# Patient Record
Sex: Male | Born: 1945 | Race: White | Hispanic: No | Marital: Married | Smoking: Former smoker
Health system: Southern US, Community
[De-identification: ages and names within clinical notes are randomized; demographics above are authoritative.]

## PROBLEM LIST (undated history)

## (undated) DIAGNOSIS — Z808 Family history of malignant neoplasm of other organs or systems: Secondary | ICD-10-CM

## (undated) DIAGNOSIS — Z860100 Personal history of colon polyps, unspecified: Secondary | ICD-10-CM

## (undated) DIAGNOSIS — F329 Major depressive disorder, single episode, unspecified: Secondary | ICD-10-CM

## (undated) DIAGNOSIS — E785 Hyperlipidemia, unspecified: Secondary | ICD-10-CM

## (undated) DIAGNOSIS — N200 Calculus of kidney: Secondary | ICD-10-CM

## (undated) DIAGNOSIS — J984 Other disorders of lung: Secondary | ICD-10-CM

## (undated) DIAGNOSIS — F32A Depression, unspecified: Secondary | ICD-10-CM

## (undated) DIAGNOSIS — I10 Essential (primary) hypertension: Secondary | ICD-10-CM

## (undated) DIAGNOSIS — Z800 Family history of malignant neoplasm of digestive organs: Secondary | ICD-10-CM

## (undated) DIAGNOSIS — R5383 Other fatigue: Secondary | ICD-10-CM

## (undated) HISTORY — DX: Personal history of colonic polyps: Z86.010

## (undated) HISTORY — DX: Personal history of colon polyps, unspecified: Z86.0100

## (undated) HISTORY — DX: Essential (primary) hypertension: I10

## (undated) HISTORY — DX: Other fatigue: R53.83

## (undated) HISTORY — DX: Hyperlipidemia, unspecified: E78.5

## (undated) HISTORY — DX: Family history of malignant neoplasm of other organs or systems: Z80.8

## (undated) HISTORY — PX: KNEE ARTHROSCOPY: SUR90

## (undated) HISTORY — PX: SKIN CANCER EXCISION: SHX779

## (undated) HISTORY — DX: Family history of malignant neoplasm of digestive organs: Z80.0

## (undated) HISTORY — DX: Depression, unspecified: F32.A

## (undated) HISTORY — DX: Other disorders of lung: J98.4

## (undated) HISTORY — DX: Calculus of kidney: N20.0

## (undated) HISTORY — DX: Major depressive disorder, single episode, unspecified: F32.9

---

## 2009-03-13 ENCOUNTER — Encounter: Payer: Self-pay | Admitting: Gastroenterology

## 2009-03-18 ENCOUNTER — Encounter (INDEPENDENT_AMBULATORY_CARE_PROVIDER_SITE_OTHER): Payer: Self-pay | Admitting: *Deleted

## 2009-03-19 ENCOUNTER — Ambulatory Visit: Payer: Self-pay | Admitting: Gastroenterology

## 2009-04-07 ENCOUNTER — Ambulatory Visit: Payer: Self-pay | Admitting: Gastroenterology

## 2009-04-10 ENCOUNTER — Encounter: Payer: Self-pay | Admitting: Gastroenterology

## 2010-03-25 ENCOUNTER — Ambulatory Visit (HOSPITAL_COMMUNITY): Admit: 2010-03-25 | Payer: Self-pay | Admitting: Psychiatry

## 2010-04-21 ENCOUNTER — Ambulatory Visit
Admission: RE | Admit: 2010-04-21 | Discharge: 2010-04-21 | Payer: Self-pay | Source: Home / Self Care | Attending: Internal Medicine | Admitting: Internal Medicine

## 2010-04-21 DIAGNOSIS — N200 Calculus of kidney: Secondary | ICD-10-CM | POA: Insufficient documentation

## 2010-04-21 DIAGNOSIS — R5383 Other fatigue: Secondary | ICD-10-CM | POA: Insufficient documentation

## 2010-04-21 DIAGNOSIS — F329 Major depressive disorder, single episode, unspecified: Secondary | ICD-10-CM | POA: Insufficient documentation

## 2010-04-21 DIAGNOSIS — J309 Allergic rhinitis, unspecified: Secondary | ICD-10-CM | POA: Insufficient documentation

## 2010-04-21 DIAGNOSIS — R5381 Other malaise: Secondary | ICD-10-CM

## 2010-04-21 DIAGNOSIS — D126 Benign neoplasm of colon, unspecified: Secondary | ICD-10-CM | POA: Insufficient documentation

## 2010-04-21 DIAGNOSIS — E785 Hyperlipidemia, unspecified: Secondary | ICD-10-CM | POA: Insufficient documentation

## 2010-04-21 DIAGNOSIS — J984 Other disorders of lung: Secondary | ICD-10-CM | POA: Insufficient documentation

## 2010-04-21 DIAGNOSIS — E559 Vitamin D deficiency, unspecified: Secondary | ICD-10-CM | POA: Insufficient documentation

## 2010-04-21 DIAGNOSIS — M109 Gout, unspecified: Secondary | ICD-10-CM | POA: Insufficient documentation

## 2010-04-21 DIAGNOSIS — I10 Essential (primary) hypertension: Secondary | ICD-10-CM | POA: Insufficient documentation

## 2010-04-21 NOTE — Procedures (Signed)
Summary: Colonoscopy  Patient: Colin Galvan Note: All result statuses are Final unless otherwise noted.  Tests: (1) Colonoscopy (COL)   COL Colonoscopy           DONE     [facility]     [address]     [place], [place]  [address]           COLONOSCOPY PROCEDURE REPORT           PATIENT:  Colin Galvan, Colin Galvan  MR#:  [id number]     BIRTHDATE:  [birth year], 63 yrs. old  GENDER:  male           ENDOSCOPIST:  Judie Petit T. Russella Dar, MD, [facility]     Referred by:  Ihor Austin. Bufford Buttner, M.D.           PROCEDURE DATE:  04/07/2009     PROCEDURE:  Colonoscopy with snare polypectomy     ASA CLASS:  Class II     INDICATIONS:  1) Routine Risk Screening           MEDICATIONS:   Fentanyl 25 mcg IV, Versed 4 mg IV           DESCRIPTION OF PROCEDURE:   After the risks benefits and     alternatives of the procedure were thoroughly explained, informed     consent was obtained.  Digital rectal exam was performed and     revealed no abnormalities.   The LB [id number]-[id number] endoscope     was introduced through the anus and advanced to the cecum, which     was identified by both the appendix and ileocecal valve, without     limitations.  The quality of the prep was excellent, using     MoviPrep.  The instrument was then slowly withdrawn as the colon     was fully examined.     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 It was     mild.  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  A     sessile polyp was found in the rectum. It was 5 mm in size. Polyp     was snared without cautery. Retrieval was successful. This was     otherwise a normal examination of the colon. Retroflexed views in     the rectum revealed internal hemorrhoids, small. The time to cecum     =  5.75  minutes. The scope was then withdrawn (time =  15.33     min) from the patient and the procedure completed.           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 Otherwise you     should continue to follow colorectal cancer screening  guidelines     for "routine risk" patients with colonoscopy in 10 years.     Venita Lick. Russella Dar, MD, [facility]           n.     eSIGNED:   Venita Lick. Aundreya Souffrant at 04/07/2009 02:08 PM           Cheryll Dessert, [id number]  Note: An exclamation mark (!) indicates a result that was not dispersed into the flowsheet. Document Creation Date: 04/07/2009 2:08 PM _______________________________________________________________________  (1) Order result status: Final Collection or observation date-time: 04/07/2009 14:01 Requested date-time:  Receipt date-time:  Reported date-time:  Referring Physician:   Ordering Physician: Claudette Head [phone]) Specimen Source:  Source: Launa Grill Order Number: [id number] Lab site:

## 2010-04-21 NOTE — Letter (Signed)
Summary: [facility]  [facility]   Imported By: Sherian Rein 04/09/2009 10:26:08  _____________________________________________________________________  External Attachment:    Type:   Image     Comment:   External Document

## 2010-04-21 NOTE — Letter (Signed)
Summary: Patient Notice- Polyp Results  [facility]  [address] [place], [place] [address]   Phone: [phone]  Fax: [phone]        April 10, 2009 MRN: [id number]    Colin Galvan [address] [place], [place]  [address]    Dear Colin Galvan,  I am pleased to inform you that the colon polyp removed during your recent colonoscopy was found to be benign (no cancer detected) upon pathologic examination. The pathology showed a 5mm tubulovillous adenoma.  I recommend you have a repeat colonoscopy examination in 3 years to look for recurrent polyps, as having colon polyps increases your risk for having recurrent polyps or even colon cancer in the future.  Should you develop new or worsening symptoms of abdominal pain, bowel habit changes or bleeding from the rectum or bowels, please schedule an evaluation with either your primary care physician or with me.  Continue treatment plan as outlined the day of your exam.  Please call us if you are having persistent problems or have questions about your condition that have not been fully answered at this time.  Sincerely,  Meryl Dare MD [facility]  This letter has been electronically signed by your physician.  Appended Document: Patient Notice- Polyp Results Letter mailed 1.21.11

## 2010-04-22 ENCOUNTER — Ambulatory Visit (HOSPITAL_COMMUNITY): Admit: 2010-04-22 | Payer: Self-pay | Admitting: Psychiatry

## 2010-04-22 ENCOUNTER — Ambulatory Visit (HOSPITAL_COMMUNITY): Payer: Self-pay | Admitting: Psychiatry

## 2010-04-29 NOTE — Assessment & Plan Note (Signed)
Summary: NEW PT APPT // RS   Vital Signs:  Patient profile:   65 year old male Height:      68.5 inches Weight:      150 pounds BMI:     22.56 Pulse rate:   78 / minute BP sitting:   120 / 82  (left arm)  Vitals Entered By: Kyung Rudd, CMA (April 21, 2010 9:52 AM) CC: NP...est care. Not feeling well   CC:  NP...est care. Not feeling well.  History of Present Illness: Colin Galvan is a very pleasant 65yo male who presents to clinic for evaluation of multiple medical complaints. Has been under the detailed and appropriate care of Dr. Bufford Buttner in [place] and partial records are reviewed including lab work and chest CT's. Notes persistent symptoms of fatigue worse in the daytime, decreased sleep ( ~6.5 hours/night) with snoring but without witnessed apnea and mild depressive symptoms despite cymbalta. States cymbalta dose was increased to 60mg   ~2 weeks ago and has yet to realize improvement. Has psychiatrist appointment scheduled and pending. Notes chronic intermittent NP cough and clear rhinorrhea. Does receive allergy shots regularly with last testing  ~2-2.5 years ago.  Due to pleural nodules has undergone three serial chest CT's with no interval change in size. Has h/o hyperlipidemia and tolerates statin therapy without abnormal LFT's or myalgias. Noted past h/o melanoma (1980 and 2006) with reported regular surveillance by dermatology. H/o nephrolithiasis with intermittent stone passage without need of procedure. 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 Reports h/ gout without recent flare and last uric acid level reviewed as normal. Last colonoscopy demonstrated 5mm TVA 1/11 with recommended 3 year followup though also does have family h/o colon cancer in a first degree relative. PSA's followed by urology and reportedly normal. Lab review includes CBC with minimally depressed HCT with nl Hgb, low normal vitamin D level despite monthly high dose vitamin D  replacement, chem7 x2 with single minimal elevation of glucose non diagnostic of DM.    Preventive Screening-Counseling & Management  Alcohol-Tobacco     Smoking Status: quit > 6 months     Smoking Cessation Counseling: no     Tobacco Counseling: not indicated; no tobacco use  Caffeine-Diet-Exercise     Does Patient Exercise: yes  Current Problems (verified): 1)  Unspecified Vitamin D Deficiency  (ICD-268.9) 2)  Allergic Rhinitis Cause Unspecified  (ICD-477.9) 3)  Colonic Polyps, Benign  (ICD-211.3) 4)  Depression, Mild  (ICD-311) 5)  Other Diseases of Lung Not Elsewhere Classified  (ICD-518.89) 6)  Gout, Unspecified  (ICD-274.9) 7)  Nephrolithiasis  (ICD-592.0) 8)  Hypertension  (ICD-401.9) 9)  Hyperlipidemia  (ICD-272.4) 10)  Family History of Melanoma  (ICD-V16.8) 11)  Family History of Colon Ca 1st Degree Relative <60  (ICD-V16.0)  Current Medications (verified): 1)  Simvastatin 20 Mg Tabs (Simvastatin) .... Once Daily 2)  Lisinopril-Hydrochlorothiazide 20-12.5 Mg Tabs (Lisinopril-Hydrochlorothiazide) .... Once Daily 3)  Zyloprim 300 Mg Tabs (Allopurinol) .... Once Daily 4)  Aspirin 81 Mg Tbec (Aspirin) .... Once Daily 5)  Cymbalta 30 Mg Cpep (Duloxetine Hcl) .... Once Daily 6)  Trazodone Hcl 100 Mg Tabs (Trazodone Hcl) .... 200mg  Once Daily 7)  Clonazepam 1 Mg Tabs (Clonazepam) .... Once Daily 8)  Prilosec 20 Mg Cpdr (Omeprazole) .... Once Daily 9)  Multivitamins  Caps (Multiple Vitamin) .... Once Daily 10)  Flonase 50 Mcg/act Susp (Fluticasone Propionate) .... 2 Sprays Each Nostril Qhs  Allergies (verified): 1)  ! Tetracycline Hcl (Tetracycline Hcl)  Past History:  Family History: Last updated: 04/21/2010 Family History of Colon CA -father Family History of Melanoma-mother Family History of Stroke M- father  Social History: Last updated: 04/21/2010 Married Alcohol use-yes-daily Regular exercise-yes former smoker-1982 Retired  Risk Factors: Exercise: yes  (04/21/2010)  Risk Factors: Smoking Status: quit > 6 months (04/21/2010)  Past medical, surgical, family and social histories (including risk factors) reviewed, and no changes noted (except as noted below).  Family History: Reviewed history and no changes required. Family History of Colon CA -father Family History of Melanoma-mother Family History of Stroke M- father  Social History: Reviewed history and no changes required. Married Alcohol use-yes-daily Regular exercise-yes former smoker-1982 Retired Does Patient Exercise:  yes Smoking Status:  quit > 6 months  Review of Systems General:  Complains of fatigue and sleep disorder; denies chills and fever. Eyes:  Denies eye irritation and red eye. ENT:  Complains of nasal congestion and postnasal drainage; denies decreased hearing and earache. CV:  Complains of fatigue; denies chest pain or discomfort, fainting, lightheadness, near fainting, palpitations, and shortness of breath with exertion. Resp:  Complains of cough and excessive snoring; denies chest discomfort, coughing up blood, hypersomnolence, shortness of breath, and sputum productive. GI:  Denies abdominal pain, bloody stools, change in bowel habits, loss of appetite, nausea, and vomiting. GU:  Denies discharge, dysuria, hematuria, and urinary frequency. MS:  Denies joint pain, joint redness, joint swelling, and low back pain. Derm:  Denies changes in color of skin, flushing, and rash. Neuro:  Denies brief paralysis, disturbances in coordination, falling down, headaches, numbness, and weakness. Psych:  Complains of depression; denies anxiety, easily angered, easily tearful, and mental problems. Endo:  Denies excessive thirst, excessive urination, and polyuria. Heme:  Denies abnormal bruising, bleeding, and fevers. Allergy:  Denies hives or rash, itching eyes, persistent infections, and sneezing.  Physical Exam  General:  Well-developed,well-nourished,in no acute  distress; alert,appropriate and cooperative throughout examination Head:  Normocephalic and atraumatic without obvious abnormalities. No apparent alopecia or balding. Eyes:  pupils equal, pupils round, corneas and lenses clear, and no injection.   Ears:  External ear exam shows no significant lesions or deformities.  Otoscopic examination reveals clear canals, tympanic membranes are intact bilaterally without bulging, retraction, inflammation or discharge. Hearing is grossly normal bilaterally. Nose:  External nasal examination shows no deformity or inflammation. Nasal mucosa are pink and moist without lesions or exudates. Mouth:  Oral mucosa and oropharynx without lesions or exudates.  Teeth in good repair. Neck:  No deformities, masses, or tenderness noted.no JVD and no carotid bruits.   Lungs:  Normal respiratory effort, chest expands symmetrically. Lungs are clear to auscultation, no crackles or wheezes. Heart:  Normal rate and regular rhythm. S1 and S2 normal without gallop, murmur, click, rub or other extra sounds. Abdomen:  Bowel sounds positive,abdomen soft and non-tender without masses, organomegaly or hernias noted. Msk:  normal ROM, no joint tenderness, and no joint swelling.   Extremities:  No clubbing, cyanosis, edema, or deformity noted with normal full range of motion of all joints.   Neurologic:  alert & oriented X3 and gait normal.   Skin:  turgor normal, color normal, and no rashes.   Cervical Nodes:  No lymphadenopathy noted Psych:  Oriented X3, normally interactive, good eye contact, not anxious appearing, and not depressed appearing.     Impression & Recommendations:  Problem # 1:  FATIGUE (ICD-780.79) Assessment Unchanged Appropriate workup to date. Recommend repeat vitamin D level, vitamin B12, repeat CBC,  iron, full LFT and early am testosterone. Suspect possible contribution from depression. UTD with recommended screening guidelines.  Problem # 2:  UNSPECIFIED VITAMIN  D DEFICIENCY (ICD-268.9) Assessment: Unchanged Repeat vitamin D level. Consider change to vitamin D 2000 units once daily.  Problem # 3:  ALLERGIC RHINITIS CAUSE UNSPECIFIED (ICD-477.9) Assessment: Deteriorated Attempt flonase. Continue allergy shots and consider re-evaluation if symptoms refractory.  His updated medication list for this problem includes:    Flonase 50 Mcg/act Susp (Fluticasone propionate) .Marland Kitchen... 2 sprays each nostril qhs  Problem # 4:  DEPRESSION, MILD (ICD-311) Assessment: Deteriorated Recent dose increase in cymbalta with potential improvement not yet realized. Keep psychiatrist appointment as scheduled.  His updated medication list for this problem includes:    Cymbalta 30 Mg Cpep (Duloxetine hcl) ..... Once daily    Trazodone Hcl 100 Mg Tabs (Trazodone hcl) ..... 200mg  once daily    Clonazepam 1 Mg Tabs (Clonazepam) ..... Once daily  Complete Medication List: 1)  Simvastatin 20 Mg Tabs (Simvastatin) .... Once daily 2)  Lisinopril-hydrochlorothiazide 20-12.5 Mg Tabs (Lisinopril-hydrochlorothiazide) .... Once daily 3)  Zyloprim 300 Mg Tabs (Allopurinol) .... Once daily 4)  Aspirin 81 Mg Tbec (Aspirin) .... Once daily 5)  Cymbalta 30 Mg Cpep (Duloxetine hcl) .... Once daily 6)  Trazodone Hcl 100 Mg Tabs (Trazodone hcl) .... 200mg  once daily 7)  Clonazepam 1 Mg Tabs (Clonazepam) .... Once daily 8)  Prilosec 20 Mg Cpdr (Omeprazole) .... Once daily 9)  Multivitamins Caps (Multiple vitamin) .... Once daily 10)  Flonase 50 Mcg/act Susp (Fluticasone propionate) .... 2 sprays each nostril qhs Prescriptions: FLONASE 50 MCG/ACT SUSP (FLUTICASONE PROPIONATE) 2 sprays each nostril qhs  #1 x 11   Entered and Authorized by:   Edwyna Perfect MD   Signed by:   Edwyna Perfect MD on 04/21/2010   Method used:   Print then Give to Patient   RxID:   [id number]    Orders Added: 1)  New Patient Level IV [84166]

## 2010-05-20 ENCOUNTER — Ambulatory Visit (INDEPENDENT_AMBULATORY_CARE_PROVIDER_SITE_OTHER): Payer: Medicare Other | Admitting: Psychiatry

## 2010-05-20 DIAGNOSIS — F329 Major depressive disorder, single episode, unspecified: Secondary | ICD-10-CM

## 2010-06-03 ENCOUNTER — Encounter (INDEPENDENT_AMBULATORY_CARE_PROVIDER_SITE_OTHER): Payer: Medicare Other | Admitting: Psychiatry

## 2010-06-03 DIAGNOSIS — F329 Major depressive disorder, single episode, unspecified: Secondary | ICD-10-CM

## 2010-06-12 NOTE — Progress Notes (Signed)
NAME:  Colin Galvan, Colin Galvan NO.:  [id number]  MEDICAL RECORD NO.:  [id number]           PATIENT TYPE:  A  LOCATION:  [facility]                           FACILITY[facility]  PHYSICIAN:  Syed T. Arfeen, M.D.   DATE OF BIRTH:  Mar 30, 1945                                PROGRESS NOTE   The patient is a 65 year old married retired Caucasian man who is referred from his primary care doctor, Dr. Carmon Ginsberg, for seeking evaluation and medication adjustment.  The patient endorsed that he has been taking Cymbalta for almost 5-6 years, but for past few months he started noticing that it is not working very well.  He is not as happy as he used to be.  He reported decreased energy, decreased concentration, getting tired easily.  His wife noticed sometimes got "short and irritable" though he denies any crying, anhedonia, hopeless, helpless and suicidal thinking, but felt maybe the medication adjustment can help his symptoms.  The patient is involved in the community.  He is retired Clinical research associate and very social.  The patient endorsed his wife has health issues and recently had knee surgery and some leg pain for the past few months.  He admitted that he has been involved by taking care of her and feels sometimes very tiring.  He denies any agitation, anger, mood swings or paranoia.  His doctor tried increasing Cymbalta from 30 to 60 mg, but the patient reported side effects including unsteady gait. After 1 week, he decided to go back to original dose of 30 mg.  The patient is retired from his Scientist, water quality.  He reported that he never regretted his decision to retire, as he continued to stay busy with his business interests, civic commitments and social network.  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  He is taking Klonopin 1 mg and trazodone 200 mg for sleep.  Recently, he has noticed at times poor sleep and some  anxiety.  In the past, he had tried Zoloft and Prestiq, but stopped due to the lack of response and some side effects.  PAST PSYCHIATRIC HISTORY: The patient endorsed he had a history of depression for almost 20 years. He does not recall the details how he started this psychiatric medication, but it was started with the primary care doctor.  Initially, he remembered taking amitriptyline, maybe Prozac, but he stayed on Zoloft for quite some time until the primary care doctor decided to switch to Prestiq due to lack of response.  However, that switching did not go very well, as the patient complained of increased panic attacks, anxiety and poor sleep and he was required to see psychiatrist, Dr. Asa Saunas in [place], who started him on Klonopin and trazodone and that did help him.  Since then, the patient has been taking Klonopin 1 mg and trazodone 200 mg for sleep ,and his medicine was switched from Prestiq to Cymbalta, which has done very well until recently he started noticing it is not working to the best.  The patient denies  any history of previous inpatient psychiatric treatment, previous history of suicidal attempt or any violence, mania or paranoia.  FAMILY HISTORY: The patient endorsed his son has history of using drugs and alcohol.  He had completed the residential treatment program in [place] and currently working, however, the patient feels that his lifestyle and future goals have not much improved.  PSYCHOSOCIAL HISTORY: The patient is born and raised in [place].  He reported no history of physical, sexual, verbal or emotional abuse.  He married twice.  His first marriage lasted for 10 years, when he mentioned his wife had an affair, and after that the patient was in multiple relationships, as he was not sure until he married with his second wife.  He has been married for 15 years.  The patient endorsed that his marital life is good, though in the past he had a history of  marriage counseling.  Recently, his wife had knee injury and leg pain, and the patient is involved in taking care of her.  MILITARY HISTORY: The patient has served in the [employer] for 2 years, from 1968 to 1970. He was drafted for [place] [employer] at [employer], however, did not go overseas and honorably discharged.  EDUCATION/WORK HISTORY: The patient has college degree.  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  ALCOHOL/SUBSTANCE ABUSE HISTORY: The patient endorsed history of using marijuana when he was very young and drinking alcohol, one to two glasses of wine for many years.  He endorsed his drinking alcohol has been stable, and he has no issue with drinking.  MEDICAL HISTORY: The patient sees Dr. Malachi Pro in [place]. 1. History of hypertension. 2. GERD. 3. Gout. 4. History of melanoma, which has treated at [facility].  CURRENT MEDICATIONS: 1. Cymbalta 30 mg daily. 2. Allopurinol 300 mg daily. 3. Trazodone 200 mg at bedtime. 4. Klonopin 1 mg at bedtime. 5. Simvastatin 20 mg in the morning. 6. Lisinopril/hydrochlorothiazide 10/12.5 mg daily. 7. Aspirin 81 mg daily. 8. Omeprazole 20 mg daily. 9. Multivitamin. 10.Stool softener. 11.Flonase spray.  MENTAL STATUS EXAM: The patient is well-dressed, well-groomed man who appears to be younger than stated age.  He maintained very good eye contact.  He is pleasant, cooperative.  His speech is clear, coherent and soft.  His thought process was logical, linear and goal-directed.  He described his mood as okay.  His affect was appropriate.  There were no psychoses including any delusions, paranoia or obsession.  He is alert and oriented x3.  His insight, judgment, thought control were okay.  His attention and concentration were within normal limits.  DIAGNOSES: AXIS I:  Depressive disorder not otherwise  specified. AXIS II:  Deferred. AXIS III:  See medical history. AXIS IV:  Mild to moderate. AXIS V:  65-70  PLAN: I talked to the patient in detail about his symptoms.  Since he had tried 60 mg of Cymbalta with some side effects, I offered to try Wellbutrin to target his residual symptoms of depression, which he agreed.  We will cross taper with Cymbalta and start Wellbutrin XL 150 mg daily.  I explained the risks and benefits of medication in detail. I also talked about his use of alcohol and benzodiazepine interaction. The patient shows some interest to weaning and cutting down his medication at some point.  We will consider tapering the benzodiazepines in the future  once he is more stable clinically.  We will also get collateral information from his primary care doctor including any recent labs, if done within a few months.  I recommended to call us immediately if he has any concerns or anytime he is having suicidal thinking, homicidal thinking or worsening of the symptoms.  I explained risks and benefits of medication in detail.  I will see him again in 2 weeks.  At this time, the patient declined any counseling, however, he reported that he will consider if he needed in the future.     Syed T. Lolly Mustache, M.D.     STA/MEDQ  D:  05/20/2010  T:  05/20/2010  Job:  [id number]  Electronically Signed by Kathryne Sharper M.D. on 05/20/2010 04:54:09 PM

## 2010-07-14 ENCOUNTER — Encounter: Payer: Self-pay | Admitting: Internal Medicine

## 2010-07-27 ENCOUNTER — Encounter (INDEPENDENT_AMBULATORY_CARE_PROVIDER_SITE_OTHER): Payer: Medicare Other | Admitting: Psychiatry

## 2010-07-27 DIAGNOSIS — F3289 Other specified depressive episodes: Secondary | ICD-10-CM

## 2010-07-27 DIAGNOSIS — F329 Major depressive disorder, single episode, unspecified: Secondary | ICD-10-CM

## 2010-08-19 ENCOUNTER — Encounter (HOSPITAL_COMMUNITY): Payer: BC Managed Care – PPO | Admitting: Psychiatry

## 2010-09-07 ENCOUNTER — Encounter (INDEPENDENT_AMBULATORY_CARE_PROVIDER_SITE_OTHER): Payer: Medicare Other | Admitting: Psychiatry

## 2010-09-07 DIAGNOSIS — F329 Major depressive disorder, single episode, unspecified: Secondary | ICD-10-CM

## 2010-10-19 ENCOUNTER — Encounter (HOSPITAL_COMMUNITY): Payer: BC Managed Care – PPO | Admitting: Psychiatry

## 2010-10-28 ENCOUNTER — Encounter (HOSPITAL_COMMUNITY): Payer: BC Managed Care – PPO | Admitting: Psychiatry

## 2010-12-28 ENCOUNTER — Ambulatory Visit (INDEPENDENT_AMBULATORY_CARE_PROVIDER_SITE_OTHER): Payer: Medicare Other | Admitting: Internal Medicine

## 2010-12-28 ENCOUNTER — Encounter: Payer: Self-pay | Admitting: Internal Medicine

## 2010-12-28 VITALS — BP 130/88 | HR 66 | Ht 69.0 in | Wt 154.4 lb

## 2010-12-28 DIAGNOSIS — J984 Other disorders of lung: Secondary | ICD-10-CM

## 2010-12-28 NOTE — Assessment & Plan Note (Signed)
1) No culture for AFB is reported from the bronchoscopy. He denies fever or night sweats, but atypical AFB might be found in this setting. 2) It is very tempting to wonder whether onset of cough and chest tightness a year ago may be related to initial treatment with lisinopril at about that time. We can give samples of an alternative medication to try for a month for comparison. 3) Not all symptoms need to be related to the same problem. His red posterior pharynx and sense of fluid in the upper chest could indicate bland reflux with heartburn prevented by regular use of omeprazole. A barium swallow might resolve this. 4) Residual allergic postnasal drainage, despite allergy vaccine, might contribute to his throat discomfort. 5) A methacholine inhalation challenge, therapeutic trial of a steroid inhaler, therapeutic trial of Singulair might all be useful probes into the role of bronchospastic airways disease.

## 2010-12-28 NOTE — Patient Instructions (Addendum)
Try replacing Lisinopril Hct with samples of Benicar HCT  40/12.5---- suggest 1/2 tab daily. Given 14 sample tablets.  Pay careful attention to whether you may be refluxing bland stomach fluid at times.  Some thoughts are listed in the Assessment section of this note. Dr. Bufford Buttner is in the best position to consider these, and to sequence therapeutic trials and studies as appropriate. I would be happy to discuss this visit with him.

## 2010-12-28 NOTE — Progress Notes (Signed)
12/28/10- 65 year old male with remote smoking history, referred courtesy of Dr. Azalia Bilis in [place], for second opinion consideration. For the past year he has had persistent complaints of chest tightness, occasional malaise, intermittent paroxysmal nonproductive cough. He basically feels healthy. Cough is more pronounced in the evening, unrelated to position or exposure. He notices a sense of fluid in his throat but denies heartburn while taking omeprazole. 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 He denies unusual exposures. If he wakes from bathroom during the night he may choke or cough when he is up and moving around. In the morning he needs to cough for a few minutes to feel that his chest is clear. He denies fever, sweats, swollen glands, waking because of cough, wheeze, choking with meals, rash, joint pain, swelling of legs, bleeding. Bronchoscopy 10/29/2010 yielded small mucous plugs on lavage, consistent with bronchiolitis. Chest CT 02/24/2010 showed tiny bilateral nodular densities, stable at one year and consistent with inflammatory origin. There were no significant changes in comparison 02/04/2009. I have reviewed the CT images personally. Spirometry: 08/03/2005 FEV1 3.75/119%, FEV1/FVC 0.83, FEF 25-75% 131% predicted. He was restarted on allergy vaccine 3 years ago after a hiatus. He believes it helps him. He was on allergy vaccine for several years before that and stopped when he seemed to be doing better. He is not able to be clear about symptoms associated with the need to start allergy shots. Rhinitis has not been significant. He is a retired Pensions consultant. His wife is well. Father was atopic. Stopped smoking 30 years ago. We reviewed medications which are listed and notes that he began lisinopril with HCTZ about one year ago.  ROS As per history of present illness Constitutional:   No-   weight loss, night  sweats, fevers, chills, fatigue, lassitude. HEENT:   No-  headaches, difficulty swallowing, tooth/dental problems, sore throat,       No-  sneezing, itching, ear ache, nasal congestion, post nasal drip,  CV:  No-   chest pain, orthopnea, PND, swelling in lower extremities, anasarca, dizziness, palpitations Resp: + shortness of breath with exertion or at rest.              No-   productive cough,  + non-productive cough,  No-  coughing up of blood.              No-   change in color of mucus.  No- wheezing.   Skin: No-   rash or lesions. GI:  No-   heartburn, indigestion, abdominal pain, nausea, vomiting, diarrhea,                 change in bowel habits, loss of appetite GU: No-   dysuria, change in color of urine, no urgency or frequency.  No- flank pain. MS:  No-   joint pain or swelling.  No- decreased range of motion.  No- back pain. Neuro- grossly normal to observation, Or:  Psych:  No- change in mood or affect. No depression or anxiety.  No memory loss.  OBJ General- Alert, Oriented, Affect-appropriate, Distress- none acute; trim, intelligent gentleman. Skin- rash-none, lesions- none, excoriation- none Lymphadenopathy- none Head- atraumatic            Eyes- Gross vision intact, PERRLA, conjunctivae clear secretions            Ears- Hearing, canals-normal  Nose- Clear, no-Septal dev, mucus, polyps, erosion, perforation             Throat- Mallampati I- II , +posterior pharyngeal mucosa looks red and irritated without visible exudate , drainage- none, tonsils- atrophic Neck- flexible , trachea midline, no stridor , thyroid nl, carotid no bruit Chest - symmetrical excursion , unlabored           Heart/CV- RRR , no murmur , no gallop  , no rub, nl s1 s2                           - JVD- none , edema- none, stasis changes- none, varices- none           Lung- clear to P&A, wheeze- none, cough- none , dullness-none, rub- none           Chest wall-  Abd- tender-no, distended-no,  bowel sounds-present, HSM- no Br/ Gen/ Rectal- Not done, not indicated Extrem- cyanosis- none, clubbing, none, atrophy- none, strength- nl Neuro- grossly intact to observation

## 2011-02-03 ENCOUNTER — Encounter (HOSPITAL_COMMUNITY): Payer: Self-pay | Admitting: Psychiatry

## 2011-02-03 ENCOUNTER — Ambulatory Visit (INDEPENDENT_AMBULATORY_CARE_PROVIDER_SITE_OTHER): Payer: Medicare Other | Admitting: Psychiatry

## 2011-02-03 VITALS — BP 158/97 | HR 60 | Ht 69.0 in | Wt 147.6 lb

## 2011-02-03 DIAGNOSIS — F329 Major depressive disorder, single episode, unspecified: Secondary | ICD-10-CM

## 2011-02-03 MED ORDER — FLUOXETINE HCL 20 MG PO CAPS
20.0000 mg | ORAL_CAPSULE | Freq: Every day | ORAL | Status: DC
Start: 2011-02-03 — End: 2011-05-19

## 2011-02-03 MED ORDER — HYDROXYZINE PAMOATE 25 MG PO CAPS: 25.0000 mg | ORAL_CAPSULE | Freq: Two times a day (BID) | ORAL | Status: AC | PRN

## 2011-02-03 MED ORDER — BUPROPION HCL ER (SR) 150 MG PO TB12: 150.0000 mg | ORAL_TABLET | Freq: Every day | ORAL | Status: DC

## 2011-02-03 NOTE — Progress Notes (Signed)
Patient came for his followup appointment he was last seen in June 2012. He continued to take Wellbutrin XL 150 in the morning and Prozac 20 mg every other day. Overall he feels less depressed less anxious however he is continued concern about taking Klonopin 1 mg at bedtime. He was to come out from this medication but he is very worried about the withdrawal symptoms. He has noticed at times frustrated and irritable but he denies any anger agitation or aggression. He's been sleeping good but he is concerned about his appetite. The his weight has been stable but he is wanted to increase his weight and now thinking to use appetite stimulants. He has noticed no involvement in his life and able to play some tenderness in summer. The sexual side effects of Prozac is much better since he is taking every other day along with Wellbutrin. He denies any anger crying spells agitation mood swing or paranoid thinking. He also takes trazodone to 200 mg at bedtime.  Mental status examination. Patient is pleasant cooperative maintained good eye contact. At times he appears somewhat anxious but slow speech but overall he was well related and engaged in conversation. He denies any active or passive suicidal thinking homicidal thinking. His attention and concentration is okay. There are no psychotic symptoms present. He is alert and oriented x3. He has good fund of knowledge and his memory is intact. There are no abnormal movements noted. His insight judgment and impulse control is ok.  Assessment. Depressive disorder NOS  Plan. I will continue his Prozac 20 mg every other day along with Wellbutrin XL 150 mg daily. I recommended to cut down his Klonopin 1 mg to half tablets and tried taking Vistaril 25 mg 1-2 capsule as needed for anxiety and insomnia. We will continue trazodone 200 mg at bedtime. I recommended after 10 days tried to come off the Klonopin completely however if he feels his anxiety is coming back then he may  continue Klonopin of 0.5 mg. I explained possible withdrawal symptoms however reducing the dose and taking Vistaril may help him to handle the symptoms better. I explained the risks and benefits of medication and recommended to call me if he has any question or concern. I will see him again in 4-6 weeks.

## 2011-04-02 ENCOUNTER — Ambulatory Visit (INDEPENDENT_AMBULATORY_CARE_PROVIDER_SITE_OTHER): Payer: Medicare Other | Admitting: Psychiatry

## 2011-04-02 ENCOUNTER — Encounter (HOSPITAL_COMMUNITY): Payer: Self-pay | Admitting: Psychiatry

## 2011-04-02 VITALS — Wt 151.4 lb

## 2011-04-02 DIAGNOSIS — F329 Major depressive disorder, single episode, unspecified: Secondary | ICD-10-CM

## 2011-04-02 NOTE — Progress Notes (Signed)
Patient came for his followup appointment. He came earlier than his his scheduled appointment. Patient reported recently he has been seeing more depressed irritable and easily frustrated. Patient do not know the stressor or any thing that causing this behavior. Patient admitted he has very good life and no financial distress. He is involved in his volunteer work and family life. He told he has been compliant with his medication but taking Prozac every other day. He also cut down his Klonopin and now taking 1/8th every night. He did not like Vistaril. His weight remains unchanged. Though he has been involved in his daily routine and now scheduled to have and will ski trip at the end of this month but does not feel his depression is under control. He reported no side effects of medication. He denies any agitation anger or violent behavior.  Mental status examination Patient is well-groomed and well-dressed. He is pleasant cooperative and maintained good eye contact. He appears somewhat anxious but he described his mood is good and his affect is pleasant. He denies any active or passive suicidal thoughts or homicidal thoughts. He denies any auditory or visual hallucination. There are no psychotic symptoms present. His thought process is logical linear and goal-directed. She's alert and oriented x3. His insight judgment and impulse control is okay.  Assessment Depressive disorder NOS  Plan. I talked to the patient into an and now recommended to take Prozac 20 mg every day. He was taking every other day in the past. I will continue his Wellbutrin XL 150 mg daily. I explained the risks and benefits of medication in detail. She will continue trazodone 200 mg daily. We will discontinue Vistaril. I also recommended to look into Depakote if Prozac 20 mg does not help. I recommended to call us if he has any question or concern about the medication. I will see her again in 3 weeks.

## 2011-04-16 DIAGNOSIS — J3089 Other allergic rhinitis: Secondary | ICD-10-CM | POA: Diagnosis not present

## 2011-04-26 ENCOUNTER — Other Ambulatory Visit (HOSPITAL_COMMUNITY): Payer: Self-pay | Admitting: Psychiatry

## 2011-04-26 DIAGNOSIS — F329 Major depressive disorder, single episode, unspecified: Secondary | ICD-10-CM

## 2011-05-07 ENCOUNTER — Ambulatory Visit (HOSPITAL_COMMUNITY): Payer: Medicare Other | Admitting: Psychiatry

## 2011-05-10 DIAGNOSIS — J45991 Cough variant asthma: Secondary | ICD-10-CM | POA: Diagnosis not present

## 2011-05-10 DIAGNOSIS — H109 Unspecified conjunctivitis: Secondary | ICD-10-CM | POA: Diagnosis not present

## 2011-05-10 DIAGNOSIS — R918 Other nonspecific abnormal finding of lung field: Secondary | ICD-10-CM | POA: Diagnosis not present

## 2011-05-17 ENCOUNTER — Ambulatory Visit (HOSPITAL_COMMUNITY): Payer: Medicare Other | Admitting: Psychiatry

## 2011-05-19 ENCOUNTER — Encounter (HOSPITAL_COMMUNITY): Payer: Self-pay | Admitting: Psychiatry

## 2011-05-19 ENCOUNTER — Ambulatory Visit (INDEPENDENT_AMBULATORY_CARE_PROVIDER_SITE_OTHER): Payer: Medicare Other | Admitting: Psychiatry

## 2011-05-19 VITALS — BP 142/97 | HR 55 | Wt 152.8 lb

## 2011-05-19 DIAGNOSIS — F3289 Other specified depressive episodes: Secondary | ICD-10-CM

## 2011-05-19 DIAGNOSIS — F329 Major depressive disorder, single episode, unspecified: Secondary | ICD-10-CM

## 2011-05-19 MED ORDER — FLUOXETINE HCL 20 MG PO CAPS: 20.0000 mg | ORAL_CAPSULE | Freq: Every day | ORAL | Status: DC

## 2011-05-19 MED ORDER — BUPROPION HCL ER (SR) 150 MG PO TB12: 150.0000 mg | ORAL_TABLET | Freq: Every day | ORAL | Status: DC

## 2011-05-19 NOTE — Progress Notes (Signed)
Chief complaint I'm doing better  History of presenting illness Patient is 66 year old married retired Caucasian man who has been seeing in this office since February 2012 for depression and anxiety. He was last seen 4 weeks ago at that time patient complained that feeling better and continues to endorse anxiety and nervousness and depressive symptoms. We have increased Prozac to 20 mg which he was taking every other day. Patient is tolerating Prozac very well and reported no side effects. Patient felt increased energy and increased socialization. He liked his current regime which is Prozac 20 mg, Wellbutrin SR 150 mg and trazodone 200 mg. He is sleeping fine and denies any depressive thoughts. He denies any crying spells, agitation or any mood swings. He denies any tremors or shakes.  Today he is spoke first time about his son who lives in [place]. Apparently patient decided to stop supporting him financially. Patient told his son is not very serious to get a job and finally as a parent we decide to stop supporting him. Patient told it was a difficult decision for them however he like to stay on this decision. Patient does not feel any reservation or guilt about it.   Current psychiatric medication Prozac 20 mg daily Wellbutrin SR 150 mg daily Trazodone 200 mg at bedtime given by primary care physician.  Past psychiatric history Patient has history of depression for almost 20 years. He has been seeing primary care physician for his depression who also knows him very well. He had tried in the past prestiq and Cymbalta. He was taking moderate dose of Klonopin however he is able to come off from Klonopin. She denies any history of previous psychiatric inpatient treatment or any suicidal attempt. Patient has a history of mania or psychosis.   Family history His endorse his son has history of using drugs and alcohol.  Psychosocial history Patient is born and raised in [place]. He denies  any history of physical sexual verbal or emotional abuse. He has been married twice. His first marriage lasted 10 years. He's been married second time for past 15 years. He endorse his meds life is very good. He has been involved in multiple community activities.  Medical history Patient has history of hypertension, GERD, gout and history of melanoma. His primary care physician is Dr. Alveda Reasons neil.  Alcohol and substance use history Patient endorsed history of using marijuana when he was very young and taking alcohol. He is still drinks socially.  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  Mental status examination Patient is well dressed and well groomed man who appears to be younger than his his stated age. He is calm cooperative and maintained good eye contact. He is pleasant and cooperative. His speech is soft clear and coherent. His thought process logical linear and goal-directed. He described his mood is good and his affect is mood congruent. He denies any active or passive suicidal thinking and homicidal thinking. There were no psychosis delusions or paranoia present. She's alert and oriented x3. His insight judgment and impulse control is okay.  Diagnosis Axis I depressive disorder NOS Axis II deferred Axis III see medical history Axis IV mild to moderate Axis V 70-75  Plan I will continue his current psychiatric medications which are Prozac 20 mg daily and Wellbutrin SR 150 mg daily. In the past we have tried increasing his Wellbutrin however patient do not see much  improvement with that. He is also getting trazodone 200 mg from his primary care physician. I have explained risks and benefits of medication in detail. I will see him again in 2 months. Time spent 30 minutes

## 2011-05-21 DIAGNOSIS — G5760 Lesion of plantar nerve, unspecified lower limb: Secondary | ICD-10-CM | POA: Diagnosis not present

## 2011-05-21 DIAGNOSIS — M25579 Pain in unspecified ankle and joints of unspecified foot: Secondary | ICD-10-CM | POA: Diagnosis not present

## 2011-05-31 DIAGNOSIS — J45991 Cough variant asthma: Secondary | ICD-10-CM | POA: Diagnosis not present

## 2011-05-31 DIAGNOSIS — R918 Other nonspecific abnormal finding of lung field: Secondary | ICD-10-CM | POA: Diagnosis not present

## 2011-06-01 DIAGNOSIS — H00029 Hordeolum internum unspecified eye, unspecified eyelid: Secondary | ICD-10-CM | POA: Diagnosis not present

## 2011-06-01 DIAGNOSIS — H02009 Unspecified entropion of unspecified eye, unspecified eyelid: Secondary | ICD-10-CM | POA: Diagnosis not present

## 2011-06-25 DIAGNOSIS — H02009 Unspecified entropion of unspecified eye, unspecified eyelid: Secondary | ICD-10-CM | POA: Diagnosis not present

## 2011-06-29 DIAGNOSIS — J3089 Other allergic rhinitis: Secondary | ICD-10-CM | POA: Diagnosis not present

## 2011-07-01 DIAGNOSIS — H02009 Unspecified entropion of unspecified eye, unspecified eyelid: Secondary | ICD-10-CM | POA: Diagnosis not present

## 2011-07-08 DIAGNOSIS — Z85820 Personal history of malignant melanoma of skin: Secondary | ICD-10-CM | POA: Diagnosis not present

## 2011-07-08 DIAGNOSIS — D485 Neoplasm of uncertain behavior of skin: Secondary | ICD-10-CM | POA: Diagnosis not present

## 2011-07-08 DIAGNOSIS — Z719 Counseling, unspecified: Secondary | ICD-10-CM | POA: Diagnosis not present

## 2011-07-08 DIAGNOSIS — L570 Actinic keratosis: Secondary | ICD-10-CM | POA: Diagnosis not present

## 2011-07-19 ENCOUNTER — Ambulatory Visit (HOSPITAL_COMMUNITY): Payer: Self-pay | Admitting: Psychiatry

## 2011-07-20 DIAGNOSIS — J3089 Other allergic rhinitis: Secondary | ICD-10-CM | POA: Diagnosis not present

## 2011-07-26 DIAGNOSIS — J3089 Other allergic rhinitis: Secondary | ICD-10-CM | POA: Diagnosis not present

## 2011-08-03 ENCOUNTER — Other Ambulatory Visit (HOSPITAL_COMMUNITY): Payer: Self-pay | Admitting: Psychiatry

## 2011-08-04 MED ORDER — BUPROPION HCL ER (SR) 150 MG PO TB12: 150.0000 mg | ORAL_TABLET | Freq: Every day | ORAL | Status: DC

## 2011-08-05 DIAGNOSIS — J3089 Other allergic rhinitis: Secondary | ICD-10-CM | POA: Diagnosis not present

## 2011-08-10 DIAGNOSIS — M109 Gout, unspecified: Secondary | ICD-10-CM | POA: Diagnosis not present

## 2011-08-10 DIAGNOSIS — J984 Other disorders of lung: Secondary | ICD-10-CM | POA: Diagnosis not present

## 2011-08-10 DIAGNOSIS — J310 Chronic rhinitis: Secondary | ICD-10-CM | POA: Diagnosis not present

## 2011-08-10 DIAGNOSIS — J3089 Other allergic rhinitis: Secondary | ICD-10-CM | POA: Diagnosis not present

## 2011-08-10 DIAGNOSIS — J301 Allergic rhinitis due to pollen: Secondary | ICD-10-CM | POA: Diagnosis not present

## 2011-08-10 DIAGNOSIS — I10 Essential (primary) hypertension: Secondary | ICD-10-CM | POA: Diagnosis not present

## 2011-08-13 DIAGNOSIS — M25549 Pain in joints of unspecified hand: Secondary | ICD-10-CM | POA: Diagnosis not present

## 2011-08-13 DIAGNOSIS — M19079 Primary osteoarthritis, unspecified ankle and foot: Secondary | ICD-10-CM | POA: Diagnosis not present

## 2011-08-13 DIAGNOSIS — M6530 Trigger finger, unspecified finger: Secondary | ICD-10-CM | POA: Diagnosis not present

## 2011-09-03 ENCOUNTER — Other Ambulatory Visit (HOSPITAL_COMMUNITY): Payer: Self-pay | Admitting: Psychiatry

## 2011-09-03 DIAGNOSIS — F329 Major depressive disorder, single episode, unspecified: Secondary | ICD-10-CM

## 2011-09-06 NOTE — Telephone Encounter (Signed)
Last Appt 05/19/11. Late cancel on 07/19/11 due to back injury. RX filled for 1 month with note to flag [id number] needs follow up appt.

## 2011-09-07 DIAGNOSIS — J3089 Other allergic rhinitis: Secondary | ICD-10-CM | POA: Diagnosis not present

## 2011-09-08 DIAGNOSIS — J45991 Cough variant asthma: Secondary | ICD-10-CM | POA: Diagnosis not present

## 2011-09-08 DIAGNOSIS — L988 Other specified disorders of the skin and subcutaneous tissue: Secondary | ICD-10-CM | POA: Diagnosis not present

## 2011-09-08 DIAGNOSIS — H43819 Vitreous degeneration, unspecified eye: Secondary | ICD-10-CM | POA: Diagnosis not present

## 2011-09-08 DIAGNOSIS — I10 Essential (primary) hypertension: Secondary | ICD-10-CM | POA: Diagnosis not present

## 2011-09-08 DIAGNOSIS — H2510 Age-related nuclear cataract, unspecified eye: Secondary | ICD-10-CM | POA: Diagnosis not present

## 2011-09-08 DIAGNOSIS — J3089 Other allergic rhinitis: Secondary | ICD-10-CM | POA: Diagnosis not present

## 2011-09-08 DIAGNOSIS — H02139 Senile ectropion of unspecified eye, unspecified eyelid: Secondary | ICD-10-CM | POA: Diagnosis not present

## 2011-09-08 DIAGNOSIS — R918 Other nonspecific abnormal finding of lung field: Secondary | ICD-10-CM | POA: Diagnosis not present

## 2011-09-14 DIAGNOSIS — J3089 Other allergic rhinitis: Secondary | ICD-10-CM | POA: Diagnosis not present

## 2011-09-20 DIAGNOSIS — J3089 Other allergic rhinitis: Secondary | ICD-10-CM | POA: Diagnosis not present

## 2011-09-29 DIAGNOSIS — J3089 Other allergic rhinitis: Secondary | ICD-10-CM | POA: Diagnosis not present

## 2011-09-30 ENCOUNTER — Other Ambulatory Visit (HOSPITAL_COMMUNITY): Payer: Self-pay | Admitting: Psychiatry

## 2011-09-30 DIAGNOSIS — F329 Major depressive disorder, single episode, unspecified: Secondary | ICD-10-CM

## 2011-10-01 ENCOUNTER — Telehealth (HOSPITAL_COMMUNITY): Payer: Self-pay

## 2011-10-01 NOTE — Telephone Encounter (Signed)
9:18am 10/02/11 called and left msg with family for pt to call and make f/u appt./sh

## 2011-10-05 ENCOUNTER — Other Ambulatory Visit (HOSPITAL_COMMUNITY): Payer: Self-pay | Admitting: *Deleted

## 2011-10-05 ENCOUNTER — Other Ambulatory Visit (HOSPITAL_COMMUNITY): Payer: Self-pay | Admitting: Psychiatry

## 2011-10-05 DIAGNOSIS — F329 Major depressive disorder, single episode, unspecified: Secondary | ICD-10-CM

## 2011-10-05 MED ORDER — FLUOXETINE HCL 20 MG PO CAPS: 20.0000 mg | ORAL_CAPSULE | Freq: Every day | ORAL | Status: DC

## 2011-10-05 MED ORDER — BUPROPION HCL ER (SR) 150 MG PO TB12: 150.0000 mg | ORAL_TABLET | Freq: Every day | ORAL | Status: DC

## 2011-10-05 NOTE — Telephone Encounter (Deleted)
Originally requested refills 09/30/11.  Needed appt. Now has appt 11/08/11. Is going abroad on or about 10/08/11.  

## 2011-10-05 NOTE — Telephone Encounter (Signed)
Originally requested refills 09/30/11.  Needed appt. Now has appt 11/08/11. Is going abroad on or about 10/08/11.

## 2011-10-05 NOTE — Addendum Note (Signed)
Addended by: Tonny Bollman on: 10/05/2011 05:32 PM   Modules accepted: Orders

## 2011-10-06 ENCOUNTER — Telehealth (HOSPITAL_COMMUNITY): Payer: Self-pay

## 2011-10-06 NOTE — Telephone Encounter (Signed)
9:03am 10/06/11 rtn pt's call from yesterday - pt left msg at 5pm to change his appt - left msg for pt to call./sh

## 2011-10-11 DIAGNOSIS — M25579 Pain in unspecified ankle and joints of unspecified foot: Secondary | ICD-10-CM | POA: Diagnosis not present

## 2011-10-11 DIAGNOSIS — G5760 Lesion of plantar nerve, unspecified lower limb: Secondary | ICD-10-CM | POA: Diagnosis not present

## 2011-11-04 DIAGNOSIS — J3089 Other allergic rhinitis: Secondary | ICD-10-CM | POA: Diagnosis not present

## 2011-11-04 DIAGNOSIS — R634 Abnormal weight loss: Secondary | ICD-10-CM | POA: Diagnosis not present

## 2011-11-08 ENCOUNTER — Encounter (INDEPENDENT_AMBULATORY_CARE_PROVIDER_SITE_OTHER): Payer: Medicare Other | Admitting: Psychiatry

## 2011-11-08 ENCOUNTER — Other Ambulatory Visit (HOSPITAL_COMMUNITY): Payer: Self-pay | Admitting: Psychiatry

## 2011-11-08 DIAGNOSIS — H11439 Conjunctival hyperemia, unspecified eye: Secondary | ICD-10-CM | POA: Diagnosis not present

## 2011-11-08 DIAGNOSIS — H02039 Senile entropion of unspecified eye, unspecified eyelid: Secondary | ICD-10-CM | POA: Diagnosis not present

## 2011-11-08 DIAGNOSIS — H02429 Myogenic ptosis of unspecified eyelid: Secondary | ICD-10-CM | POA: Diagnosis not present

## 2011-11-08 DIAGNOSIS — F329 Major depressive disorder, single episode, unspecified: Secondary | ICD-10-CM

## 2011-11-08 DIAGNOSIS — J3089 Other allergic rhinitis: Secondary | ICD-10-CM | POA: Diagnosis not present

## 2011-11-08 DIAGNOSIS — H01029 Squamous blepharitis unspecified eye, unspecified eyelid: Secondary | ICD-10-CM | POA: Diagnosis not present

## 2011-11-08 DIAGNOSIS — M2420 Disorder of ligament, unspecified site: Secondary | ICD-10-CM | POA: Diagnosis not present

## 2011-11-08 MED ORDER — FLUOXETINE HCL 20 MG PO CAPS: 20.0000 mg | ORAL_CAPSULE | Freq: Every day | ORAL | Status: DC

## 2011-11-08 NOTE — Progress Notes (Signed)
This encounter was created in error - please disregard.

## 2011-11-09 ENCOUNTER — Other Ambulatory Visit (HOSPITAL_COMMUNITY): Payer: Self-pay | Admitting: Psychiatry

## 2011-11-10 ENCOUNTER — Other Ambulatory Visit (HOSPITAL_COMMUNITY): Payer: Self-pay | Admitting: Psychiatry

## 2011-11-10 DIAGNOSIS — F329 Major depressive disorder, single episode, unspecified: Secondary | ICD-10-CM

## 2011-11-12 ENCOUNTER — Encounter (HOSPITAL_COMMUNITY): Payer: Self-pay | Admitting: Psychiatry

## 2011-11-12 ENCOUNTER — Ambulatory Visit (INDEPENDENT_AMBULATORY_CARE_PROVIDER_SITE_OTHER): Payer: Self-pay | Admitting: Psychiatry

## 2011-11-12 DIAGNOSIS — F329 Major depressive disorder, single episode, unspecified: Secondary | ICD-10-CM

## 2011-11-12 MED ORDER — FLUOXETINE HCL 20 MG PO CAPS: 20.0000 mg | ORAL_CAPSULE | Freq: Every day | ORAL | Status: DC

## 2011-11-12 MED ORDER — BUPROPION HCL ER (SR) 150 MG PO TB12: 150.0000 mg | ORAL_TABLET | ORAL | Status: DC

## 2011-11-12 NOTE — Progress Notes (Signed)
Chief complaint Medication management and followup.    History of presenting illness Patient is 66 year old married retired Caucasian man who came for his followup appointment.  Patient was last seen in February 2013. He is been compliant with his medication.  He denies any side effects.  Patient endorse some marital distress.  He is been married for 17 years but recently he believe that it has been falling apart.  However he has a good vacation to [place] with his wife which helped their relationship.  Patient does not feel he need any meds counseling.  He believe things are getting better.  He denies any agitation anger mood swing.  He feels his current medications are working very well.  Denies any active or passive suicidal thoughts or homicidal thoughts.  Patient has close contact with his son however he is not helping him financially.  Current psychiatric medication Prozac 20 mg daily Wellbutrin SR 150 mg daily Trazodone 200 mg at bedtime given by primary care physician.  Past psychiatric history Patient has history of depression for almost 20 years. He has been seeing primary care physician for his depression who also knows him very well. He had tried in the past prestiq and Cymbalta. He was taking moderate dose of Klonopin however he is able to come off from Klonopin. She denies any history of previous psychiatric inpatient treatment or any suicidal attempt. Patient has a history of mania or psychosis.   Family history His endorse his son has history of using drugs and alcohol.  Psychosocial history Patient is born and raised in [place]. He denies any history of physical sexual verbal or emotional abuse. He has been married twice. His first marriage lasted 10 years. He's been married second time for past 15 years. He endorse his meds life is very good. He has been involved in multiple community activities.  Medical history Patient has history of hypertension, GERD, gout  and history of melanoma. His primary care physician is Dr. Alveda Reasons neil.  Alcohol and substance use history Patient endorsed history of using marijuana when he was very young and taking alcohol. He is still drinks socially.  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  Mental status examination Patient is well dressed and groomed.  He is pleasant and cooperative.  His speech is clear and coherent.  His thought process logical linear and goal-directed.  He described his mood is good and his affect is mood appropriate.  There were no psychotic symptoms present at this time.  He denies any auditory or visual hallucination.  There were no psychotic symptoms present.  His attention and concentration is fair.  She's alert and oriented x3.  His insight judgment and impulse control is okay.  Diagnosis Axis I depressive disorder NOS Axis II deferred Axis III see medical history Axis IV mild to moderate Axis V 70-75  Plan I will continue his current psychiatric medications which are Prozac 20 mg daily and Wellbutrin SR 150 mg daily.  He is getting trazodone from his primary care physician for insomnia.  We have discuss that he is taking 3 antidepressant however patient does not have any side effects of his medication.  He is reluctant to cut down or stop any antidepressant.  I explained risks and benefits of the medication and recommended to call us if his any question or concern or if he feel worsening of the symptoms.  I will see him again in 3 months.

## 2011-11-17 DIAGNOSIS — I10 Essential (primary) hypertension: Secondary | ICD-10-CM | POA: Diagnosis not present

## 2011-11-17 DIAGNOSIS — E785 Hyperlipidemia, unspecified: Secondary | ICD-10-CM | POA: Diagnosis not present

## 2011-11-17 DIAGNOSIS — R5381 Other malaise: Secondary | ICD-10-CM | POA: Diagnosis not present

## 2011-11-17 DIAGNOSIS — R0602 Shortness of breath: Secondary | ICD-10-CM | POA: Diagnosis not present

## 2011-11-17 DIAGNOSIS — J3089 Other allergic rhinitis: Secondary | ICD-10-CM | POA: Diagnosis not present

## 2011-11-17 DIAGNOSIS — R634 Abnormal weight loss: Secondary | ICD-10-CM | POA: Diagnosis not present

## 2011-11-17 DIAGNOSIS — C434 Malignant melanoma of scalp and neck: Secondary | ICD-10-CM | POA: Diagnosis not present

## 2011-11-17 DIAGNOSIS — N200 Calculus of kidney: Secondary | ICD-10-CM | POA: Diagnosis not present

## 2011-11-19 DIAGNOSIS — J3089 Other allergic rhinitis: Secondary | ICD-10-CM | POA: Diagnosis not present

## 2011-11-19 DIAGNOSIS — R7309 Other abnormal glucose: Secondary | ICD-10-CM | POA: Diagnosis not present

## 2011-11-24 DIAGNOSIS — J370 Chronic laryngitis: Secondary | ICD-10-CM | POA: Diagnosis not present

## 2011-11-24 DIAGNOSIS — J3089 Other allergic rhinitis: Secondary | ICD-10-CM | POA: Diagnosis not present

## 2011-11-24 DIAGNOSIS — K219 Gastro-esophageal reflux disease without esophagitis: Secondary | ICD-10-CM | POA: Diagnosis not present

## 2011-11-24 DIAGNOSIS — B370 Candidal stomatitis: Secondary | ICD-10-CM | POA: Diagnosis not present

## 2011-11-24 DIAGNOSIS — R1310 Dysphagia, unspecified: Secondary | ICD-10-CM | POA: Diagnosis not present

## 2011-11-25 ENCOUNTER — Telehealth (HOSPITAL_COMMUNITY): Payer: Self-pay

## 2011-11-25 ENCOUNTER — Other Ambulatory Visit (HOSPITAL_COMMUNITY): Payer: Self-pay | Admitting: Psychiatry

## 2011-11-25 ENCOUNTER — Telehealth (HOSPITAL_COMMUNITY): Payer: Self-pay | Admitting: *Deleted

## 2011-11-25 NOTE — Telephone Encounter (Signed)
Was instructed by PCP to contact Dr.Arfeen regarding Drug Interaction/Reaction.

## 2011-11-25 NOTE — Telephone Encounter (Signed)
11/25/11 9:03am 11/25/11 Pt called and requested to speak with you about drug interaction - medication - Fluconazole 100mg  this was prescript by Clarene Essex, PA - pt need to speak with you./sh

## 2011-11-25 NOTE — Telephone Encounter (Signed)
Patient called and called returned and Left message.

## 2011-11-26 ENCOUNTER — Other Ambulatory Visit (HOSPITAL_COMMUNITY): Payer: Self-pay | Admitting: Psychiatry

## 2011-11-26 NOTE — Telephone Encounter (Signed)
Call returned.  Explained the interaction of fluconazole an antidepressant.  Patient is taking fluconazole for 2 weeks for thrush.  Patient also requested psychologist for counseling and we will refer him to PhD psychologist in [place].

## 2011-12-02 ENCOUNTER — Ambulatory Visit (INDEPENDENT_AMBULATORY_CARE_PROVIDER_SITE_OTHER): Payer: Self-pay | Admitting: Psychology

## 2011-12-02 DIAGNOSIS — F339 Major depressive disorder, recurrent, unspecified: Secondary | ICD-10-CM

## 2011-12-03 ENCOUNTER — Encounter (HOSPITAL_COMMUNITY): Payer: Self-pay | Admitting: Psychology

## 2011-12-03 NOTE — Progress Notes (Signed)
Patient:   Colin Galvan   DOB:   [birth year]  MR Number:  [id number]  Location:  [facility] [address] [place] [address] Dept: [phone]           Date of Service:   12/02/2011  Start Time:   11 AM End Time:   12 PM  Provider/Observer:  Hershal Coria PSYD       Billing Code/Service: [phone]  Chief Complaint:     Chief Complaint  Patient presents with  . Depression    Reason for Service:  The patient was referred because of issues of depression, coping difficulties, emotional distress. The patient reports that he does have a history of significant depression over the years but that current marital issues are the primary thing that he has had to deal with. His wife has a long history of traumatic experiences and has had difficulty coping with feelings of abandonment. When the patient is overwhelmed when they argue they tend to separate which tightens his wife's feeling of abandonment. She would like to initially seen himself in work into her marital therapy to situations.  Current Status:  The patient acknowledges difficulty with coping right now on a recurrence of his depression symptoms.  Reliability of Information: The information was provided by the patient himself as well as review of available medical records.  Behavioral Observation: Sally Reimers  presents as a 66 y.o.-year-old Right Caucasian Male who appeared his stated age. his dress was Appropriate and he was Well Groomed and his manners were Appropriate to the situation.  There were not any physical disabilities noted.  he displayed an appropriate level of cooperation and motivation.    Interactions:    Active   Attention:   within normal limits  Memory:   within normal limits  Visuo-spatial:   within normal limits  Speech (Volume):  normal  Speech:   normal pitch and normal volume  Thought  Process:  Coherent  Though Content:  WNL  Orientation:   person, place, time/date and situation  Judgment:   Good  Planning:   Good  Affect:    Depressed  Mood:    Depressed  Insight:   Good  Intelligence:   very high  Marital Status/Living: The patient is married to his second wife and both he and his second wife have had significant difficulties in their first marriages. They have been married for 17 years now and have had difficulties off and on as well as very good times. They're both to each other.  Current Employment: Patient is currently retired.  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  Substance Use:  No concerns of substance abuse are reported.    Education:   College  Medical History:   Past Medical History  Diagnosis Date  . Fatigue   . Vitamin d deficiency   . Allergic rhinitis   . Hx of colonic polyps   . Depression   . Other diseases of lung, not elsewhere classified   . Gout   . Nephrolithiasis   . Hypertension   . Hyperlipidemia   . Family hx of melanoma   . Family hx of colon cancer         Outpatient Encounter Prescriptions as of 12/02/2011  Medication Sig Dispense Refill  . allopurinol (ZYLOPRIM) 300 MG tablet Take 300 mg by mouth daily.        Marland Kitchen  aspirin 81 MG tablet Take 81 mg by mouth daily.        Marland Kitchen buPROPion (WELLBUTRIN SR) 150 MG 12 hr tablet Take 1 tablet (150 mg total) by mouth every morning.  90 tablet  0  . docusate sodium (STOOL SOFTENER) 100 MG capsule Take 100 mg by mouth 2 (two) times daily as needed.        Marland Kitchen FLUoxetine (PROZAC) 20 MG capsule Take 1 capsule (20 mg total) by mouth daily.  90 capsule  0  . fluticasone (FLONASE) 50 MCG/ACT nasal spray Place 2 sprays into the nose 3 times/day as needed-between meals & bedtime.       . metoprolol succinate (TOPROL-XL) 25 MG 24 hr tablet Take 25 mg by mouth daily.      . Multiple Vitamin (MULTIVITAMIN) capsule Take 1  capsule by mouth daily.        Marland Kitchen omeprazole (PRILOSEC) 20 MG capsule Take 20 mg by mouth daily.        . simvastatin (ZOCOR) 20 MG tablet Take 20 mg by mouth at bedtime.        . traZODone (DESYREL) 100 MG tablet Take 200 mg by mouth at bedtime.              The patient graduated from college as well as law school.  Sexual History:   History  Sexual Activity  . Sexually Active: Not on file    Abuse/Trauma History: The patient had a very difficult first marriage.  Psychiatric History:  The patient knowledge is a long history of episodic periods of depression.  Family Med/Psych History: No family history on file.  Risk of Suicide/Violence: virtually non-existent   Impression/DX:  The patient is wanting to build more coping skills around his issues of depression as well as for him to improve the factors that r and worsening the marriage between he and his wife. Both of them love each other great deal and did not want to separate with they have episodic periods of conflict.  Disposition/Plan:  We will next meet with the patient's wife individually and then look at setting up for marital therapy.  Diagnosis:    Axis I:   1. Major depression, recurrent         Axis II: No diagnosis       Axis III:        Axis IV:  other psychosocial or environmental problems          Axis V:  51-60 moderate symptoms

## 2011-12-06 DIAGNOSIS — Q762 Congenital spondylolisthesis: Secondary | ICD-10-CM | POA: Diagnosis not present

## 2011-12-06 DIAGNOSIS — M25519 Pain in unspecified shoulder: Secondary | ICD-10-CM | POA: Diagnosis not present

## 2011-12-06 DIAGNOSIS — M810 Age-related osteoporosis without current pathological fracture: Secondary | ICD-10-CM | POA: Diagnosis not present

## 2011-12-09 DIAGNOSIS — R0982 Postnasal drip: Secondary | ICD-10-CM | POA: Diagnosis not present

## 2011-12-09 DIAGNOSIS — H43819 Vitreous degeneration, unspecified eye: Secondary | ICD-10-CM | POA: Diagnosis not present

## 2011-12-09 DIAGNOSIS — R1310 Dysphagia, unspecified: Secondary | ICD-10-CM | POA: Diagnosis not present

## 2011-12-09 DIAGNOSIS — J370 Chronic laryngitis: Secondary | ICD-10-CM | POA: Diagnosis not present

## 2011-12-09 DIAGNOSIS — B370 Candidal stomatitis: Secondary | ICD-10-CM | POA: Diagnosis not present

## 2011-12-09 DIAGNOSIS — H43399 Other vitreous opacities, unspecified eye: Secondary | ICD-10-CM | POA: Diagnosis not present

## 2011-12-09 DIAGNOSIS — K219 Gastro-esophageal reflux disease without esophagitis: Secondary | ICD-10-CM | POA: Diagnosis not present

## 2011-12-10 DIAGNOSIS — Q762 Congenital spondylolisthesis: Secondary | ICD-10-CM | POA: Diagnosis not present

## 2011-12-10 DIAGNOSIS — M25519 Pain in unspecified shoulder: Secondary | ICD-10-CM | POA: Diagnosis not present

## 2011-12-10 DIAGNOSIS — M810 Age-related osteoporosis without current pathological fracture: Secondary | ICD-10-CM | POA: Diagnosis not present

## 2011-12-13 DIAGNOSIS — M25519 Pain in unspecified shoulder: Secondary | ICD-10-CM | POA: Diagnosis not present

## 2011-12-13 DIAGNOSIS — Q762 Congenital spondylolisthesis: Secondary | ICD-10-CM | POA: Diagnosis not present

## 2011-12-13 DIAGNOSIS — M810 Age-related osteoporosis without current pathological fracture: Secondary | ICD-10-CM | POA: Diagnosis not present

## 2011-12-13 DIAGNOSIS — J3089 Other allergic rhinitis: Secondary | ICD-10-CM | POA: Diagnosis not present

## 2011-12-15 ENCOUNTER — Ambulatory Visit (INDEPENDENT_AMBULATORY_CARE_PROVIDER_SITE_OTHER): Payer: Medicare Other | Admitting: Internal Medicine

## 2011-12-15 ENCOUNTER — Encounter: Payer: Self-pay | Admitting: Internal Medicine

## 2011-12-15 VITALS — BP 118/78 | HR 71 | Temp 98.0°F | Resp 16 | Ht 67.25 in | Wt 148.8 lb

## 2011-12-15 DIAGNOSIS — R634 Abnormal weight loss: Secondary | ICD-10-CM

## 2011-12-15 DIAGNOSIS — E559 Vitamin D deficiency, unspecified: Secondary | ICD-10-CM

## 2011-12-15 DIAGNOSIS — R5383 Other fatigue: Secondary | ICD-10-CM | POA: Diagnosis not present

## 2011-12-15 DIAGNOSIS — R5381 Other malaise: Secondary | ICD-10-CM | POA: Diagnosis not present

## 2011-12-15 LAB — SEDIMENTATION RATE: Sed Rate: 1 mm/hr (ref 0–16)

## 2011-12-15 NOTE — Progress Notes (Signed)
  Subjective:    Patient ID: Colin Galvan, male    DOB: 07/25/1945, 66 y.o.   MRN: [id number]  HPI Pt presents to clinic for followup of multiple medical problems. Notes continued chronic fatigue possibly worse. Laboratory evaluation to date has been unremarkable. Outside physician recently obtained CBC Chem-12 TSH free T4 B12 Lyme titer and urine culture reportedly normal. Recently he has been evaluated by ET who after laryngoscopy felt patient had GERD. Continues to take stable dose of omeprazole daily. No dysphagia. PSYCHIATRY and weaned off Cymbalta but previously felt mood was better controlled with Cymbalta. Feels some degree of irritability currently with Prozac and Wellbutrin. With change of medication there was no change in energy level. Has followup with psychiatry in a future. Feels he has recurrent upper respiratory infections but denies oral ulcers, arthralgias, a.m. stiffness or rash. Does note change in bowel habits related to constipation without hematochezia or melena. Also notes unintended weight loss. Has history of colon polyp with last colonoscopy reported to be 2011 with three-year followup.  Past Medical History  Diagnosis Date  . Fatigue   . Vitamin d deficiency   . Allergic rhinitis   . Hx of colonic polyps   . Depression   . Other diseases of lung, not elsewhere classified   . Gout   . Nephrolithiasis   . Hypertension   . Hyperlipidemia   . Family hx of melanoma   . Family hx of colon cancer    No past surgical history on file.  reports that he quit smoking about 31 years ago. He does not have any smokeless tobacco history on file. He reports that he drinks alcohol. He reports that he does not use illicit drugs. family history is not on file. Allergies  Allergen Reactions  . Tetracycline Hcl       Review of Systems see hpi will     Objective:   Physical Exam  Nursing note and vitals reviewed. Constitutional: He appears well-developed and well-nourished.  No distress.  HENT:  Head: Normocephalic and atraumatic.  Eyes: Conjunctivae normal are normal. No scleral icterus.  Neck: Neck supple. No thyromegaly present.  Cardiovascular: Normal rate, regular rhythm and normal heart sounds.  Exam reveals no gallop and no friction rub.   No murmur heard. Pulmonary/Chest: Breath sounds normal. No respiratory distress. He has no wheezes. He has no rales.  Abdominal: Soft. Bowel sounds are normal. He exhibits no distension and no mass. There is no tenderness. There is no rebound and no guarding.  Lymphadenopathy:    He has no cervical adenopathy.    He has no axillary adenopathy.       Right: No inguinal and no supraclavicular adenopathy present.       Left: No inguinal and no supraclavicular adenopathy present.  Neurological: He is alert.  Skin: Skin is warm and dry. He is not diaphoretic.  Psychiatric: He has a normal mood and affect.          Assessment & Plan:

## 2011-12-15 NOTE — Assessment & Plan Note (Signed)
Obtain ESR, ANA, testosterone and vitamin D

## 2011-12-15 NOTE — Assessment & Plan Note (Signed)
With unintended weight loss associated change in bowel habits and GERD recommend GI consultation. States he will discuss with his son-in-law who is a gastroenterologist first and will contact the clinic with his decision. Given samples of dexilant for three weeks in substitution omeprazole. Close followup scheduled

## 2011-12-16 DIAGNOSIS — M25519 Pain in unspecified shoulder: Secondary | ICD-10-CM | POA: Diagnosis not present

## 2011-12-16 DIAGNOSIS — M810 Age-related osteoporosis without current pathological fracture: Secondary | ICD-10-CM | POA: Diagnosis not present

## 2011-12-16 DIAGNOSIS — Q762 Congenital spondylolisthesis: Secondary | ICD-10-CM | POA: Diagnosis not present

## 2011-12-16 LAB — ANTI-NUCLEAR AB-TITER (ANA TITER)

## 2011-12-16 LAB — ANA: Anti Nuclear Antibody(ANA): POSITIVE — AB

## 2011-12-21 DIAGNOSIS — M810 Age-related osteoporosis without current pathological fracture: Secondary | ICD-10-CM | POA: Diagnosis not present

## 2011-12-21 DIAGNOSIS — M25519 Pain in unspecified shoulder: Secondary | ICD-10-CM | POA: Diagnosis not present

## 2011-12-21 DIAGNOSIS — Q762 Congenital spondylolisthesis: Secondary | ICD-10-CM | POA: Diagnosis not present

## 2011-12-22 ENCOUNTER — Encounter: Payer: Self-pay | Admitting: *Deleted

## 2011-12-23 ENCOUNTER — Ambulatory Visit (INDEPENDENT_AMBULATORY_CARE_PROVIDER_SITE_OTHER): Payer: BC Managed Care – PPO | Admitting: Psychology

## 2011-12-23 DIAGNOSIS — F339 Major depressive disorder, recurrent, unspecified: Secondary | ICD-10-CM

## 2011-12-24 ENCOUNTER — Encounter (HOSPITAL_COMMUNITY): Payer: Self-pay | Admitting: Psychology

## 2011-12-24 DIAGNOSIS — Q762 Congenital spondylolisthesis: Secondary | ICD-10-CM | POA: Diagnosis not present

## 2011-12-24 DIAGNOSIS — M810 Age-related osteoporosis without current pathological fracture: Secondary | ICD-10-CM | POA: Diagnosis not present

## 2011-12-24 DIAGNOSIS — M25519 Pain in unspecified shoulder: Secondary | ICD-10-CM | POA: Diagnosis not present

## 2011-12-24 NOTE — Progress Notes (Signed)
Today we continued work on issues related to the underlying stressors as part of marital therapy and dealing with some of the long-term stressors that the patient and his wife facing. Today we focused a lot on the patient's oldest son and worked on ways that they can cope and deal with the current situation. Both the patient and his wife both report that there have been significant improvements in the relationship.

## 2011-12-28 DIAGNOSIS — Q762 Congenital spondylolisthesis: Secondary | ICD-10-CM | POA: Diagnosis not present

## 2011-12-28 DIAGNOSIS — M810 Age-related osteoporosis without current pathological fracture: Secondary | ICD-10-CM | POA: Diagnosis not present

## 2011-12-28 DIAGNOSIS — M25519 Pain in unspecified shoulder: Secondary | ICD-10-CM | POA: Diagnosis not present

## 2011-12-29 DIAGNOSIS — J3089 Other allergic rhinitis: Secondary | ICD-10-CM | POA: Diagnosis not present

## 2011-12-30 DIAGNOSIS — M25519 Pain in unspecified shoulder: Secondary | ICD-10-CM | POA: Diagnosis not present

## 2011-12-30 DIAGNOSIS — Q762 Congenital spondylolisthesis: Secondary | ICD-10-CM | POA: Diagnosis not present

## 2011-12-30 DIAGNOSIS — M810 Age-related osteoporosis without current pathological fracture: Secondary | ICD-10-CM | POA: Diagnosis not present

## 2012-01-04 DIAGNOSIS — M25519 Pain in unspecified shoulder: Secondary | ICD-10-CM | POA: Diagnosis not present

## 2012-01-04 DIAGNOSIS — M810 Age-related osteoporosis without current pathological fracture: Secondary | ICD-10-CM | POA: Diagnosis not present

## 2012-01-04 DIAGNOSIS — Q762 Congenital spondylolisthesis: Secondary | ICD-10-CM | POA: Diagnosis not present

## 2012-01-06 DIAGNOSIS — M25519 Pain in unspecified shoulder: Secondary | ICD-10-CM | POA: Diagnosis not present

## 2012-01-06 DIAGNOSIS — Q762 Congenital spondylolisthesis: Secondary | ICD-10-CM | POA: Diagnosis not present

## 2012-01-06 DIAGNOSIS — M810 Age-related osteoporosis without current pathological fracture: Secondary | ICD-10-CM | POA: Diagnosis not present

## 2012-01-10 DIAGNOSIS — M25519 Pain in unspecified shoulder: Secondary | ICD-10-CM | POA: Diagnosis not present

## 2012-01-10 DIAGNOSIS — M810 Age-related osteoporosis without current pathological fracture: Secondary | ICD-10-CM | POA: Diagnosis not present

## 2012-01-10 DIAGNOSIS — Q762 Congenital spondylolisthesis: Secondary | ICD-10-CM | POA: Diagnosis not present

## 2012-01-12 DIAGNOSIS — J3089 Other allergic rhinitis: Secondary | ICD-10-CM | POA: Diagnosis not present

## 2012-01-13 DIAGNOSIS — Q762 Congenital spondylolisthesis: Secondary | ICD-10-CM | POA: Diagnosis not present

## 2012-01-13 DIAGNOSIS — M810 Age-related osteoporosis without current pathological fracture: Secondary | ICD-10-CM | POA: Diagnosis not present

## 2012-01-13 DIAGNOSIS — M25519 Pain in unspecified shoulder: Secondary | ICD-10-CM | POA: Diagnosis not present

## 2012-01-18 DIAGNOSIS — Z23 Encounter for immunization: Secondary | ICD-10-CM | POA: Diagnosis not present

## 2012-01-21 DIAGNOSIS — J3089 Other allergic rhinitis: Secondary | ICD-10-CM | POA: Diagnosis not present

## 2012-01-24 DIAGNOSIS — J301 Allergic rhinitis due to pollen: Secondary | ICD-10-CM | POA: Diagnosis not present

## 2012-01-24 DIAGNOSIS — J3089 Other allergic rhinitis: Secondary | ICD-10-CM | POA: Diagnosis not present

## 2012-01-25 DIAGNOSIS — J3089 Other allergic rhinitis: Secondary | ICD-10-CM | POA: Diagnosis not present

## 2012-02-08 DIAGNOSIS — L821 Other seborrheic keratosis: Secondary | ICD-10-CM | POA: Diagnosis not present

## 2012-02-08 DIAGNOSIS — L820 Inflamed seborrheic keratosis: Secondary | ICD-10-CM | POA: Diagnosis not present

## 2012-02-08 DIAGNOSIS — L570 Actinic keratosis: Secondary | ICD-10-CM | POA: Diagnosis not present

## 2012-02-09 ENCOUNTER — Ambulatory Visit (INDEPENDENT_AMBULATORY_CARE_PROVIDER_SITE_OTHER): Payer: BC Managed Care – PPO | Admitting: Psychiatry

## 2012-02-09 ENCOUNTER — Encounter (HOSPITAL_COMMUNITY): Payer: Self-pay | Admitting: Psychiatry

## 2012-02-09 DIAGNOSIS — F329 Major depressive disorder, single episode, unspecified: Secondary | ICD-10-CM

## 2012-02-09 MED ORDER — BUPROPION HCL ER (SR) 200 MG PO TB12: 200.0000 mg | ORAL_TABLET | ORAL | Status: DC

## 2012-02-09 MED ORDER — FLUOXETINE HCL 20 MG PO CAPS: 20.0000 mg | ORAL_CAPSULE | Freq: Every day | ORAL | Status: DC

## 2012-02-09 NOTE — Progress Notes (Addendum)
Chief complaint Medication management and followup.    History of presenting illness Patient is 66 year old married retired Caucasian man who came for his followup appointment.  Patient is compliant with his antidepressant.  He also start seeing therapist in regional office.  He endorse much improvement in his family live since he seeing therapist.  He continued to endorse some sexual side effects with Prozac and decreased energy.  He is trying to lose his weight .  He likes the Wellbutrin which is helping his anxiety and depression .  He also wants to continue followup with his primary care physician do to long-distance.  Patient lives in [place] and is at least one hour commute.  Overall patient is fairly stable on his current psychiatric medication.  She sleeping better.  He denies any feeling of hopelessness helplessness or any anhedonia.  He denies any agitation anger mood swing.  He denies any active or passive suicidal thoughts.  Current psychiatric medication Prozac 20 mg daily Wellbutrin SR 150 mg daily Trazodone 200 mg at bedtime given by primary care physician.  Past psychiatric history Patient has history of depression for almost 20 years. He has been seeing primary care physician for his depression who also knows him very well. He had tried in the past prestiq and Cymbalta. He was taking moderate dose of Klonopin however he is able to come off from Klonopin. She denies any history of previous psychiatric inpatient treatment or any suicidal attempt. Patient denies any history of mania or psychosis.   Family history His endorse his son has history of using drugs and alcohol.  Psychosocial history Patient is born and raised in [place]. He denies any history of physical sexual verbal or emotional abuse. He has been married twice. His first marriage lasted 10 years. He's been married second time for past 15 years. He endorse his meds life is very good. He has been  involved in multiple community activities.  Medical history Patient has history of hypertension, GERD, gout and history of melanoma. His primary care physician is Dr. Alveda Reasons neil.  Alcohol and substance use history Patient endorsed history of using marijuana when he was very young and taking alcohol. He is still drinks socially.  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  Mental status examination Patient is well dressed and groomed.  He is pleasant and cooperative.  His speech is clear and coherent.  His thought process logical linear and goal-directed.  He described his mood is good and his affect is mood appropriate.  There were no psychotic symptoms present at this time.  He denies any auditory or visual hallucination.  There were no psychotic symptoms present.  His attention and concentration is fair.  She's alert and oriented x3.  His insight judgment and impulse control is okay.  Diagnosis Axis I depressive disorder NOS Axis II deferred Axis III see medical history Axis IV mild to moderate Axis V 70-75  Plan I recommend to try Wellbutrin SR 200 mg to help reducing his sexual side effects, increased energy level.  I explained the sexual side effects could be due to Prozac however I will concerned reducing the dosage of Prozac.  Patient like to followup with his primary care physician however he will give [place] a call if his primary care physician agreed to continue and manage his psychiatric medication.  I recommend to see therapist for coping and social  skills.  I recommend to call us if he is any question or concern about the medication or if he feel worsening of the symptoms.  I will see him again in 3 months.  Portion of this note is generated with dictated software and may contain typographical error.

## 2012-02-14 DIAGNOSIS — J3089 Other allergic rhinitis: Secondary | ICD-10-CM | POA: Diagnosis not present

## 2012-02-15 DIAGNOSIS — M25569 Pain in unspecified knee: Secondary | ICD-10-CM | POA: Diagnosis not present

## 2012-02-16 ENCOUNTER — Telehealth (HOSPITAL_COMMUNITY): Payer: Self-pay

## 2012-02-16 NOTE — Telephone Encounter (Signed)
Call returned.  Patient is wondering about the dosage of Prozac.  I reassured him that we have not change the Prozac at this time.  We have increased Wellbutrin however if he started to feel any change in his behavior mood any side effects than he should call us.

## 2012-02-21 DIAGNOSIS — H02039 Senile entropion of unspecified eye, unspecified eyelid: Secondary | ICD-10-CM | POA: Diagnosis not present

## 2012-02-21 DIAGNOSIS — H02539 Eyelid retraction unspecified eye, unspecified lid: Secondary | ICD-10-CM | POA: Diagnosis not present

## 2012-02-21 DIAGNOSIS — H02049 Spastic entropion of unspecified eye, unspecified eyelid: Secondary | ICD-10-CM | POA: Diagnosis not present

## 2012-02-21 DIAGNOSIS — H02009 Unspecified entropion of unspecified eye, unspecified eyelid: Secondary | ICD-10-CM | POA: Diagnosis not present

## 2012-02-21 DIAGNOSIS — L918 Other hypertrophic disorders of the skin: Secondary | ICD-10-CM | POA: Diagnosis not present

## 2012-02-21 DIAGNOSIS — M2420 Disorder of ligament, unspecified site: Secondary | ICD-10-CM | POA: Diagnosis not present

## 2012-02-21 DIAGNOSIS — L908 Other atrophic disorders of skin: Secondary | ICD-10-CM | POA: Diagnosis not present

## 2012-02-24 DIAGNOSIS — J3089 Other allergic rhinitis: Secondary | ICD-10-CM | POA: Diagnosis not present

## 2012-03-03 DIAGNOSIS — R3915 Urgency of urination: Secondary | ICD-10-CM | POA: Diagnosis not present

## 2012-03-03 DIAGNOSIS — N400 Enlarged prostate without lower urinary tract symptoms: Secondary | ICD-10-CM | POA: Diagnosis not present

## 2012-03-07 DIAGNOSIS — F430 Acute stress reaction: Secondary | ICD-10-CM | POA: Diagnosis not present

## 2012-03-07 DIAGNOSIS — E785 Hyperlipidemia, unspecified: Secondary | ICD-10-CM | POA: Diagnosis not present

## 2012-03-07 DIAGNOSIS — N200 Calculus of kidney: Secondary | ICD-10-CM | POA: Diagnosis not present

## 2012-03-07 DIAGNOSIS — C434 Malignant melanoma of scalp and neck: Secondary | ICD-10-CM | POA: Diagnosis not present

## 2012-03-07 DIAGNOSIS — J3089 Other allergic rhinitis: Secondary | ICD-10-CM | POA: Diagnosis not present

## 2012-03-07 DIAGNOSIS — I10 Essential (primary) hypertension: Secondary | ICD-10-CM | POA: Diagnosis not present

## 2012-03-07 DIAGNOSIS — J45991 Cough variant asthma: Secondary | ICD-10-CM | POA: Diagnosis not present

## 2012-03-07 DIAGNOSIS — E559 Vitamin D deficiency, unspecified: Secondary | ICD-10-CM | POA: Diagnosis not present

## 2012-03-08 DIAGNOSIS — N400 Enlarged prostate without lower urinary tract symptoms: Secondary | ICD-10-CM | POA: Diagnosis not present

## 2012-03-08 DIAGNOSIS — R3915 Urgency of urination: Secondary | ICD-10-CM | POA: Diagnosis not present

## 2012-03-08 DIAGNOSIS — I10 Essential (primary) hypertension: Secondary | ICD-10-CM | POA: Diagnosis not present

## 2012-03-08 DIAGNOSIS — J3089 Other allergic rhinitis: Secondary | ICD-10-CM | POA: Diagnosis not present

## 2012-03-08 DIAGNOSIS — E785 Hyperlipidemia, unspecified: Secondary | ICD-10-CM | POA: Diagnosis not present

## 2012-03-08 DIAGNOSIS — Z87442 Personal history of urinary calculi: Secondary | ICD-10-CM | POA: Diagnosis not present

## 2012-03-08 DIAGNOSIS — E559 Vitamin D deficiency, unspecified: Secondary | ICD-10-CM | POA: Diagnosis not present

## 2012-04-06 DIAGNOSIS — J3089 Other allergic rhinitis: Secondary | ICD-10-CM | POA: Diagnosis not present

## 2012-04-11 ENCOUNTER — Telehealth (HOSPITAL_COMMUNITY): Payer: Self-pay | Admitting: *Deleted

## 2012-04-12 ENCOUNTER — Ambulatory Visit (INDEPENDENT_AMBULATORY_CARE_PROVIDER_SITE_OTHER): Payer: BC Managed Care – PPO | Admitting: Psychology

## 2012-04-12 ENCOUNTER — Encounter (HOSPITAL_COMMUNITY): Payer: Self-pay | Admitting: Psychology

## 2012-04-12 DIAGNOSIS — F4322 Adjustment disorder with anxiety: Secondary | ICD-10-CM

## 2012-04-12 NOTE — Progress Notes (Signed)
The patient comes in today we continue to work on coping skills and strategies around marital discord. The patient reports that he has been very stressed but tends to avoid in-depth conversations with his wife for fear that he will upset her more. We worked on Pharmacologist in issues that were brought up during his wife's visit earlier this week.

## 2012-04-25 DIAGNOSIS — J3089 Other allergic rhinitis: Secondary | ICD-10-CM | POA: Diagnosis not present

## 2012-04-26 ENCOUNTER — Ambulatory Visit (INDEPENDENT_AMBULATORY_CARE_PROVIDER_SITE_OTHER): Payer: BC Managed Care – PPO | Admitting: Psychology

## 2012-04-26 DIAGNOSIS — F4323 Adjustment disorder with mixed anxiety and depressed mood: Secondary | ICD-10-CM

## 2012-05-11 ENCOUNTER — Ambulatory Visit (HOSPITAL_COMMUNITY): Payer: Self-pay | Admitting: Psychiatry

## 2012-05-15 ENCOUNTER — Encounter (HOSPITAL_COMMUNITY): Payer: Self-pay | Admitting: Psychology

## 2012-05-15 ENCOUNTER — Other Ambulatory Visit (HOSPITAL_COMMUNITY): Payer: Self-pay | Admitting: Psychiatry

## 2012-05-15 DIAGNOSIS — J3089 Other allergic rhinitis: Secondary | ICD-10-CM | POA: Diagnosis not present

## 2012-05-15 DIAGNOSIS — F411 Generalized anxiety disorder: Secondary | ICD-10-CM | POA: Diagnosis not present

## 2012-05-15 DIAGNOSIS — R0602 Shortness of breath: Secondary | ICD-10-CM | POA: Diagnosis not present

## 2012-05-15 DIAGNOSIS — I10 Essential (primary) hypertension: Secondary | ICD-10-CM | POA: Diagnosis not present

## 2012-05-15 DIAGNOSIS — R634 Abnormal weight loss: Secondary | ICD-10-CM | POA: Diagnosis not present

## 2012-05-15 DIAGNOSIS — J45991 Cough variant asthma: Secondary | ICD-10-CM | POA: Diagnosis not present

## 2012-05-15 DIAGNOSIS — C434 Malignant melanoma of scalp and neck: Secondary | ICD-10-CM | POA: Diagnosis not present

## 2012-05-15 DIAGNOSIS — E785 Hyperlipidemia, unspecified: Secondary | ICD-10-CM | POA: Diagnosis not present

## 2012-05-15 NOTE — Progress Notes (Signed)
We continue to work on issues related to adjustment issues particularly around the relationship between the patient and his wife.

## 2012-06-13 DIAGNOSIS — J3089 Other allergic rhinitis: Secondary | ICD-10-CM | POA: Diagnosis not present

## 2012-06-15 ENCOUNTER — Ambulatory Visit (INDEPENDENT_AMBULATORY_CARE_PROVIDER_SITE_OTHER): Payer: BC Managed Care – PPO | Admitting: Psychology

## 2012-06-15 DIAGNOSIS — F4323 Adjustment disorder with mixed anxiety and depressed mood: Secondary | ICD-10-CM

## 2012-06-19 ENCOUNTER — Encounter (HOSPITAL_COMMUNITY): Payer: Self-pay | Admitting: Psychology
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
## 2012-06-29 ENCOUNTER — Other Ambulatory Visit (HOSPITAL_COMMUNITY): Payer: Self-pay | Admitting: Psychiatry

## 2012-07-04 DIAGNOSIS — J3089 Other allergic rhinitis: Secondary | ICD-10-CM | POA: Diagnosis not present

## 2012-07-06 ENCOUNTER — Other Ambulatory Visit (HOSPITAL_COMMUNITY): Payer: Self-pay | Admitting: Psychiatry

## 2012-07-06 NOTE — Telephone Encounter (Signed)
Last appt with Dr.Arfeen 02/09/12. Appt made for 05/11/12 was cancelled and never rescheduled. Requested front office staff contact pt to schedule appt prior to refill

## 2012-07-06 NOTE — Telephone Encounter (Signed)
1644 07/06/12: Front office staff left message for pt to contact office.

## 2012-07-24 ENCOUNTER — Ambulatory Visit: Payer: Self-pay | Admitting: Family Medicine

## 2012-07-25 DIAGNOSIS — J3089 Other allergic rhinitis: Secondary | ICD-10-CM | POA: Diagnosis not present

## 2012-07-31 DIAGNOSIS — G5760 Lesion of plantar nerve, unspecified lower limb: Secondary | ICD-10-CM | POA: Diagnosis not present

## 2012-07-31 DIAGNOSIS — M25579 Pain in unspecified ankle and joints of unspecified foot: Secondary | ICD-10-CM | POA: Diagnosis not present

## 2012-08-08 DIAGNOSIS — H04129 Dry eye syndrome of unspecified lacrimal gland: Secondary | ICD-10-CM | POA: Diagnosis not present

## 2012-08-08 DIAGNOSIS — H2510 Age-related nuclear cataract, unspecified eye: Secondary | ICD-10-CM | POA: Diagnosis not present

## 2012-08-09 DIAGNOSIS — J3089 Other allergic rhinitis: Secondary | ICD-10-CM | POA: Diagnosis not present

## 2012-08-16 DIAGNOSIS — J3089 Other allergic rhinitis: Secondary | ICD-10-CM | POA: Diagnosis not present

## 2012-08-24 DIAGNOSIS — D235 Other benign neoplasm of skin of trunk: Secondary | ICD-10-CM | POA: Diagnosis not present

## 2012-08-25 ENCOUNTER — Other Ambulatory Visit (HOSPITAL_COMMUNITY): Payer: Self-pay | Admitting: Psychiatry

## 2012-08-26 ENCOUNTER — Other Ambulatory Visit (HOSPITAL_COMMUNITY): Payer: Self-pay | Admitting: Psychiatry

## 2012-08-31 DIAGNOSIS — J3089 Other allergic rhinitis: Secondary | ICD-10-CM | POA: Diagnosis not present

## 2012-09-06 DIAGNOSIS — D179 Benign lipomatous neoplasm, unspecified: Secondary | ICD-10-CM | POA: Diagnosis not present

## 2012-09-06 DIAGNOSIS — N200 Calculus of kidney: Secondary | ICD-10-CM | POA: Diagnosis not present

## 2012-09-06 DIAGNOSIS — J45991 Cough variant asthma: Secondary | ICD-10-CM | POA: Diagnosis not present

## 2012-09-06 DIAGNOSIS — E559 Vitamin D deficiency, unspecified: Secondary | ICD-10-CM | POA: Diagnosis not present

## 2012-09-06 DIAGNOSIS — F411 Generalized anxiety disorder: Secondary | ICD-10-CM | POA: Diagnosis not present

## 2012-09-06 DIAGNOSIS — I10 Essential (primary) hypertension: Secondary | ICD-10-CM | POA: Diagnosis not present

## 2012-09-06 DIAGNOSIS — R0602 Shortness of breath: Secondary | ICD-10-CM | POA: Diagnosis not present

## 2012-09-06 DIAGNOSIS — L988 Other specified disorders of the skin and subcutaneous tissue: Secondary | ICD-10-CM | POA: Diagnosis not present

## 2012-09-14 DIAGNOSIS — D235 Other benign neoplasm of skin of trunk: Secondary | ICD-10-CM | POA: Diagnosis not present

## 2012-09-14 DIAGNOSIS — L821 Other seborrheic keratosis: Secondary | ICD-10-CM | POA: Diagnosis not present

## 2012-09-20 ENCOUNTER — Ambulatory Visit (INDEPENDENT_AMBULATORY_CARE_PROVIDER_SITE_OTHER): Payer: Medicare Other | Admitting: Psychology

## 2012-09-20 DIAGNOSIS — F4323 Adjustment disorder with mixed anxiety and depressed mood: Secondary | ICD-10-CM

## 2012-09-21 DIAGNOSIS — J3089 Other allergic rhinitis: Secondary | ICD-10-CM | POA: Diagnosis not present

## 2012-09-25 ENCOUNTER — Other Ambulatory Visit (HOSPITAL_COMMUNITY): Payer: Self-pay | Admitting: Psychiatry

## 2012-09-26 ENCOUNTER — Telehealth (HOSPITAL_COMMUNITY): Payer: Self-pay

## 2012-09-26 DIAGNOSIS — J3089 Other allergic rhinitis: Secondary | ICD-10-CM | POA: Diagnosis not present

## 2012-10-04 DIAGNOSIS — R7309 Other abnormal glucose: Secondary | ICD-10-CM | POA: Diagnosis not present

## 2012-10-04 DIAGNOSIS — J3089 Other allergic rhinitis: Secondary | ICD-10-CM | POA: Diagnosis not present

## 2012-10-05 DIAGNOSIS — H526 Other disorders of refraction: Secondary | ICD-10-CM | POA: Diagnosis not present

## 2012-10-05 DIAGNOSIS — H2510 Age-related nuclear cataract, unspecified eye: Secondary | ICD-10-CM | POA: Diagnosis not present

## 2012-10-10 DIAGNOSIS — M25579 Pain in unspecified ankle and joints of unspecified foot: Secondary | ICD-10-CM | POA: Diagnosis not present

## 2012-10-10 DIAGNOSIS — G5760 Lesion of plantar nerve, unspecified lower limb: Secondary | ICD-10-CM | POA: Diagnosis not present

## 2012-10-16 DIAGNOSIS — J3089 Other allergic rhinitis: Secondary | ICD-10-CM | POA: Diagnosis not present

## 2012-10-23 DIAGNOSIS — J3089 Other allergic rhinitis: Secondary | ICD-10-CM | POA: Diagnosis not present

## 2012-10-23 DIAGNOSIS — M25519 Pain in unspecified shoulder: Secondary | ICD-10-CM | POA: Diagnosis not present

## 2012-10-23 DIAGNOSIS — M25529 Pain in unspecified elbow: Secondary | ICD-10-CM | POA: Diagnosis not present

## 2012-10-24 ENCOUNTER — Encounter (HOSPITAL_COMMUNITY): Payer: Self-pay | Admitting: Psychiatry

## 2012-10-24 ENCOUNTER — Ambulatory Visit (INDEPENDENT_AMBULATORY_CARE_PROVIDER_SITE_OTHER): Payer: Medicare Other | Admitting: Psychiatry

## 2012-10-24 VITALS — BP 132/90 | HR 68 | Ht 69.0 in | Wt 151.4 lb

## 2012-10-24 DIAGNOSIS — F329 Major depressive disorder, single episode, unspecified: Secondary | ICD-10-CM

## 2012-10-24 MED ORDER — BUPROPION HCL ER (XL) 150 MG PO TB24: 150.0000 mg | ORAL_TABLET | ORAL | Status: DC

## 2012-10-24 MED ORDER — FLUOXETINE HCL 10 MG PO CAPS: ORAL_CAPSULE | ORAL | Status: DC

## 2012-10-24 NOTE — Progress Notes (Signed)
[facility] [address] Progress Note  Colin Galvan [id number] 67 y.o.  10/24/2012 10:56 AM  Chief Complaint: 'm noticing more irritability and short temper.  History of Present Illness:   Colin Galvan is a 67 year old Caucasian retired married man who came for his appointment.  He was last seen in November 2013.  He's been getting his antidepressant medication from his primary care physician.  The Colin Galvan told a few months ago his Wellbutrin was increased from 150-200 because he was noticing depression.  Colin Galvan did not explain when he fell reason to increase Wellbutrin other than complaining of depression.  Colin Galvan told that he has been experiencing increased irritability anger short temper and jumpy.  He endorsed that his wife also noticed these symptoms.  Patient do not recall any stressors in recent months.  He continues to play tennis 3 times a week, no issues with sleep, involve in the community and does a regular walking and exercise.  He seeing therapist In [place] office.  Colin Galvan admitted some stress from the marriage but did not explain the details.  Colin Galvan told he's been sleeping better.  He has good level of energy but not sure why he is been more short temper and irritable.  He denies any active or passive suicidal thoughts and homicidal thoughts.  His taking his Prozac 20 mg daily and trazodone 200 mg at bedtime for sleep.  Denies any tremors or shakes.  Denies any hallucinations or any paranoia.  We have no records from his primary care physician describing the reason to increase Wellbutrin.  Suicidal Ideation: No Plan Formed: No Colin Galvan has means to carry out plan: No  Homicidal Ideation: No Plan Formed: No Colin Galvan has means to carry out plan: No  Review of Systems: Psychiatric: Agitation: Irritability Hallucination: No Depressed Mood: No Insomnia: No Hypersomnia: No Altered Concentration: No Feels Worthless: No Grandiose Ideas: No Belief In Special Powers:  No New/Increased Substance Abuse: No Compulsions: No  Neurologic: Headache: No Seizure: No Paresthesias: No  Medical History;  Colin Galvan has GERD, gout, history of melanoma and hypertension.  His primary care physician is Dr. Malachi Galvan who is also a close friend of the Colin Galvan.  Psychosocial History: Colin Galvan lives in [place].  Colin Galvan denies any history of physical sexual or emotional abuse.  He's been married twice.  His hematocrit with his wife for at least 15 years.  Colin Galvan endorsed involvement in multiple community activities.  Outpatient Encounter Prescriptions as of 10/24/2012  Medication Sig Dispense Refill  . allopurinol (ZYLOPRIM) 300 MG tablet Take 300 mg by mouth daily.        Marland Kitchen aspirin 81 MG tablet Take 81 mg by mouth daily.        Marland Kitchen docusate sodium (STOOL SOFTENER) 100 MG capsule Take 100 mg by mouth 2 (two) times daily as needed.        Marland Kitchen FLUoxetine (PROZAC) 10 MG capsule Take 3 capsule daily  90 capsule  0  . fluticasone (FLONASE) 50 MCG/ACT nasal spray Place 2 sprays into the nose 3 times/day as needed-between meals & bedtime.       . IPRATROPIUM BROMIDE NA Place 2 sprays into the nose daily.      Marland Kitchen losartan (COZAAR) 50 MG tablet Take 50 mg by mouth daily.      . Multiple Vitamin (MULTIVITAMIN) capsule Take 1 capsule by mouth daily.        Marland Kitchen omeprazole (PRILOSEC) 20 MG capsule Take 20 mg by mouth daily.        Marland Kitchen  simvastatin (ZOCOR) 20 MG tablet Take 20 mg by mouth at bedtime.        . traZODone (DESYREL) 100 MG tablet Take 200 mg by mouth at bedtime.        . [DISCONTINUED] buPROPion (WELLBUTRIN SR) 200 MG 12 hr tablet Take 1 tablet (200 mg total) by mouth every morning.  90 tablet  0  . [DISCONTINUED] FLUoxetine (PROZAC) 20 MG capsule Take 1 capsule (20 mg total) by mouth daily.  90 capsule  0  . buPROPion (WELLBUTRIN XL) 150 MG 24 hr tablet Take 1 tablet (150 mg total) by mouth every morning.  30 tablet  0  . metoprolol succinate (TOPROL-XL) 25 MG 24 hr  tablet Take 25 mg by mouth daily.      . ranitidine (ZANTAC) 150 MG tablet Take 150 mg by mouth daily.       No facility-administered encounter medications on file as of 10/24/2012.    No results found for this or any previous visit (from the past 72 hour(s)).  Past Psychiatric History/Hospitalization(s)  Colin Galvan has history of depression for almost 20 years.  His medication was mostly managed by his primary care physician who knows her very well.  In the past he has tried Cymbalta, Klonopin and Prestiq.  Colin Galvan denies any history of mania or psychosis.  He denies any history of psychiatric inpatient treatment or any suicidal attempt. Anxiety: Yes Bipolar Disorder: No Depression: Yes Mania: No Psychosis: No Schizophrenia: No Personality Disorder: No Hospitalization for psychiatric illness: No History of Electroconvulsive Shock Therapy: No Prior Suicide Attempts: No  Physical Exam: Constitutional:  BP 132/90  Pulse 68  Ht 5\' 9"  (1.753 m)  Wt 151 lb 6.4 oz (68.675 kg)  BMI 22.35 kg/m2  Musculoskeletal: Strength & Muscle Tone: within normal limits Gait & Station: normal Colin Galvan leans: N/A  Mental Status Examination;  Colin Galvan is well dressed and well groomed man who appears to be in his stated age.  He is pleasant and cooperative.  His speech is slow but clear and coherent.  His thought processes logical.  He denies any active or passive suicidal thoughts and homicidal thoughts.  He denies any auditory or visual hallucination.  There were no paranoia or delusions present at this time.  He described his mood okay and his affect is constricted.  There were no tremors or shakes.  His attention and concentration is fair.  He is alert and oriented x3.  His insight judgment and impulse control is okay.   Medical Decision Making (Choose Three): Review of Psycho-Social Stressors (1), Decision to obtain old records (1), Established Problem, Worsening (2), Review of Medication Regimen & Side  Effects (2) and Review of New Medication or Change in Dosage (2)  Assessment: Axis I: Depressive disorder NOS  Axis II: Deferred  Axis III: see medical history  Axis IV: Moderate   Plan: I reviewed his symptoms, history, current medication and psychosocial stressors.  It is unclear why Wellbutrin was increased from 150-200.  Colin Galvan does experience some depression but did not explain the details.  However at the same time he has noticed increased irritability and short temper.  I explained that sometime increase Wellbutrin can cause above symptoms.  I recommend to reduce his Wellbutrin to 150.  We will write on a discussion of Wellbutrin XL 150 mg daily.  We also talked about increasing Prozac to 30 mg since Colin Galvan is concerned going to Wellbutrin may cause increased depression.  Colin Galvan is also concerned about sexual  side effects with the Prozac however I recommend to call us back if he noticed any change or side effects.  He also talked about adding Lamictal to help his mood lability the Colin Galvan declined to add any medication at this time.  We will get records from his primary care physician.  I also recommend that if he noticed anything unusual that he should call us back.  Followup in 3 weeksTime spent 25 minutes.  More than 50% of the time spent in psychoeducation, counseling and coordination of care.  Discuss safety plan that anytime having active suicidal thoughts or homicidal thoughts then Colin Galvan need to call 911 or go to the local emergency room.   Omayra Tulloch T., MD 10/24/2012

## 2012-10-31 DIAGNOSIS — J3089 Other allergic rhinitis: Secondary | ICD-10-CM | POA: Diagnosis not present

## 2012-11-08 ENCOUNTER — Encounter (HOSPITAL_COMMUNITY): Payer: Self-pay | Admitting: Psychology

## 2012-11-08 NOTE — Progress Notes (Signed)
We continued work on skills particular around marital discord. The patient and his wife are continuing to struggle with finding a balance between their own immediate needs and desires and how that will interplay with their overall marital status.

## 2012-11-14 ENCOUNTER — Ambulatory Visit (INDEPENDENT_AMBULATORY_CARE_PROVIDER_SITE_OTHER): Payer: Medicare Other | Admitting: Psychiatry

## 2012-11-14 ENCOUNTER — Encounter (HOSPITAL_COMMUNITY): Payer: Self-pay | Admitting: Psychiatry

## 2012-11-14 VITALS — BP 124/86 | HR 61 | Ht 69.0 in | Wt 152.0 lb

## 2012-11-14 DIAGNOSIS — F329 Major depressive disorder, single episode, unspecified: Secondary | ICD-10-CM

## 2012-11-14 MED ORDER — BUPROPION HCL ER (XL) 150 MG PO TB24: 150.0000 mg | ORAL_TABLET | ORAL | Status: DC

## 2012-11-14 MED ORDER — FLUOXETINE HCL 10 MG PO CAPS: ORAL_CAPSULE | ORAL | Status: DC

## 2012-11-14 NOTE — Progress Notes (Signed)
[facility] [address] Progress Note  Colin Galvan [id number] 67 y.o.  11/14/2012 10:25 AM  Chief Complaint: I am doing better .    History of Present Illness:   Patient is a 67 year old Caucasian retired married man who came for his appointment.   Patient is doing better on Wellbutrin XL 150 and Prozac 30 mg.  He is also taking trazodone from his primary care physician.  He denies any irritability anger or any mood swing.  He noticed much calm since the dose of Prozac has increased and Wellbutrin dose was reduced.  He seeing therapist for coping and social skills.  He is excited about upcoming vacation with his wife.  He continues to involved in volunteer work.  He denies any irritability or anger.  He denies any tremors or shakes.  He is sleeping better.  He wants to continue his current psychiatric medication.  Suicidal Ideation: No Plan Formed: No Patient has means to carry out plan: No  Homicidal Ideation: No Plan Formed: No Patient has means to carry out plan: No  Review of Systems: Psychiatric: Agitation: No Hallucination: No Depressed Mood: No Insomnia: No Hypersomnia: No Altered Concentration: No Feels Worthless: No Grandiose Ideas: No Belief In Special Powers: No New/Increased Substance Abuse: No Compulsions: No  Neurologic: Headache: No Seizure: No Paresthesias: No  Medical History;  Patient has GERD, gout, history of melanoma and hypertension.  His primary care physician is Dr. Malachi Pro who is also a close friend of the patient.  Psychosocial History: Patient lives in [place].  Patient denies any history of physical sexual or emotional abuse.  He's been married twice.  His hematocrit with his wife for at least 15 years.  Patient endorsed involvement in multiple community activities.  Outpatient Encounter Prescriptions as of 11/14/2012  Medication Sig Dispense Refill  . allopurinol (ZYLOPRIM) 300 MG tablet Take 300 mg by mouth  daily.        Marland Kitchen aspirin 81 MG tablet Take 81 mg by mouth daily.        Marland Kitchen buPROPion (WELLBUTRIN XL) 150 MG 24 hr tablet Take 1 tablet (150 mg total) by mouth every morning.  30 tablet  2  . docusate sodium (STOOL SOFTENER) 100 MG capsule Take 100 mg by mouth 2 (two) times daily as needed.        Marland Kitchen FLUoxetine (PROZAC) 10 MG capsule Take 3 capsule daily  90 capsule  2  . fluticasone (FLONASE) 50 MCG/ACT nasal spray Place 2 sprays into the nose 3 times/day as needed-between meals & bedtime.       . IPRATROPIUM BROMIDE NA Place 2 sprays into the nose daily.      Marland Kitchen losartan (COZAAR) 50 MG tablet Take 50 mg by mouth daily.      . metoprolol succinate (TOPROL-XL) 25 MG 24 hr tablet Take 25 mg by mouth daily.      . Multiple Vitamin (MULTIVITAMIN) capsule Take 1 capsule by mouth daily.        Marland Kitchen omeprazole (PRILOSEC) 20 MG capsule Take 20 mg by mouth daily.        . ranitidine (ZANTAC) 150 MG tablet Take 150 mg by mouth daily.      . simvastatin (ZOCOR) 20 MG tablet Take 20 mg by mouth at bedtime.        . traZODone (DESYREL) 100 MG tablet Take 200 mg by mouth at bedtime.        . [DISCONTINUED] buPROPion (WELLBUTRIN XL) 150  MG 24 hr tablet Take 1 tablet (150 mg total) by mouth every morning.  30 tablet  0  . [DISCONTINUED] FLUoxetine (PROZAC) 10 MG capsule Take 3 capsule daily  90 capsule  0   No facility-administered encounter medications on file as of 11/14/2012.    No results found for this or any previous visit (from the past 72 hour(s)).  Past Psychiatric History/Hospitalization(s)  Patient has history of depression for almost 20 years.  His medication was mostly managed by his primary care physician who knows her very well.  In the past he has tried Cymbalta, Klonopin and Prestiq.  Patient denies any history of mania or psychosis.  He denies any history of psychiatric inpatient treatment or any suicidal attempt. Anxiety: Yes Bipolar Disorder: No Depression: Yes Mania: No Psychosis:  No Schizophrenia: No Personality Disorder: No Hospitalization for psychiatric illness: No History of Electroconvulsive Shock Therapy: No Prior Suicide Attempts: No  Physical Exam: Constitutional:  BP 124/86  Pulse 61  Ht 5\' 9"  (1.753 m)  Wt 152 lb (68.947 kg)  BMI 22.44 kg/m2  Musculoskeletal: Strength & Muscle Tone: within normal limits Gait & Station: normal Patient leans: N/A  Mental Status Examination;  Patient is well dressed and well groomed man who appears to be in his stated age.  He is pleasant and cooperative.  His speech is slow but clear and coherent.  His thought processes logical.  He denies any active or passive suicidal thoughts and homicidal thoughts.  He denies any auditory or visual hallucination.  There were no paranoia or delusions present at this time.  He described his mood okay and his affect is constricted.  There were no tremors or shakes.  His attention and concentration is fair.  He is alert and oriented x3.  His insight judgment and impulse control is okay.   Medical Decision Making (Choose Three): Established Problem, Stable/Improving (1), Review of Last Therapy Session (1) and Review of New Medication or Change in Dosage (2)  Assessment: Axis I: Depressive disorder NOS  Axis II: Deferred  Axis III: see medical history  Axis IV: Moderate   Plan: I will continue Prozac 20 mg daily and Wellbutrin XL 150 mg daily.  Recommended to call us back if he has any questions or concerns or if he feels worsening of the symptoms.  Patient is getting trazodone from his primary care physician.  Recommend to keep appointment with a therapist.  I will see him again in 3 months.  Eileene Kisling T., MD 11/14/2012

## 2012-11-15 DIAGNOSIS — J3089 Other allergic rhinitis: Secondary | ICD-10-CM | POA: Diagnosis not present

## 2012-11-21 DIAGNOSIS — J3089 Other allergic rhinitis: Secondary | ICD-10-CM | POA: Diagnosis not present

## 2012-11-27 DIAGNOSIS — J301 Allergic rhinitis due to pollen: Secondary | ICD-10-CM | POA: Diagnosis not present

## 2012-11-27 DIAGNOSIS — J3089 Other allergic rhinitis: Secondary | ICD-10-CM | POA: Diagnosis not present

## 2012-12-04 DIAGNOSIS — H04129 Dry eye syndrome of unspecified lacrimal gland: Secondary | ICD-10-CM | POA: Diagnosis not present

## 2012-12-08 DIAGNOSIS — R0982 Postnasal drip: Secondary | ICD-10-CM | POA: Diagnosis not present

## 2012-12-08 DIAGNOSIS — J309 Allergic rhinitis, unspecified: Secondary | ICD-10-CM | POA: Diagnosis not present

## 2012-12-08 DIAGNOSIS — R5381 Other malaise: Secondary | ICD-10-CM | POA: Diagnosis not present

## 2012-12-08 DIAGNOSIS — H9209 Otalgia, unspecified ear: Secondary | ICD-10-CM | POA: Diagnosis not present

## 2012-12-08 DIAGNOSIS — J029 Acute pharyngitis, unspecified: Secondary | ICD-10-CM | POA: Diagnosis not present

## 2012-12-11 DIAGNOSIS — R5381 Other malaise: Secondary | ICD-10-CM | POA: Diagnosis not present

## 2012-12-15 DIAGNOSIS — J3089 Other allergic rhinitis: Secondary | ICD-10-CM | POA: Diagnosis not present

## 2012-12-20 DIAGNOSIS — Z23 Encounter for immunization: Secondary | ICD-10-CM | POA: Diagnosis not present

## 2012-12-20 DIAGNOSIS — J3089 Other allergic rhinitis: Secondary | ICD-10-CM | POA: Diagnosis not present

## 2013-01-01 DIAGNOSIS — J3089 Other allergic rhinitis: Secondary | ICD-10-CM | POA: Diagnosis not present

## 2013-01-15 DIAGNOSIS — J3089 Other allergic rhinitis: Secondary | ICD-10-CM | POA: Diagnosis not present

## 2013-01-22 DIAGNOSIS — J3089 Other allergic rhinitis: Secondary | ICD-10-CM | POA: Diagnosis not present

## 2013-01-23 DIAGNOSIS — D235 Other benign neoplasm of skin of trunk: Secondary | ICD-10-CM | POA: Diagnosis not present

## 2013-01-23 DIAGNOSIS — K644 Residual hemorrhoidal skin tags: Secondary | ICD-10-CM | POA: Diagnosis not present

## 2013-01-23 DIAGNOSIS — D2360 Other benign neoplasm of skin of unspecified upper limb, including shoulder: Secondary | ICD-10-CM | POA: Diagnosis not present

## 2013-01-29 DIAGNOSIS — I10 Essential (primary) hypertension: Secondary | ICD-10-CM | POA: Diagnosis not present

## 2013-01-29 DIAGNOSIS — E559 Vitamin D deficiency, unspecified: Secondary | ICD-10-CM | POA: Diagnosis not present

## 2013-01-29 DIAGNOSIS — J3089 Other allergic rhinitis: Secondary | ICD-10-CM | POA: Diagnosis not present

## 2013-02-06 DIAGNOSIS — J3089 Other allergic rhinitis: Secondary | ICD-10-CM | POA: Diagnosis not present

## 2013-02-12 DIAGNOSIS — J3089 Other allergic rhinitis: Secondary | ICD-10-CM | POA: Diagnosis not present

## 2013-02-14 ENCOUNTER — Encounter (HOSPITAL_COMMUNITY): Payer: Self-pay | Admitting: Psychiatry

## 2013-02-14 ENCOUNTER — Encounter (INDEPENDENT_AMBULATORY_CARE_PROVIDER_SITE_OTHER): Payer: Self-pay

## 2013-02-14 ENCOUNTER — Ambulatory Visit (INDEPENDENT_AMBULATORY_CARE_PROVIDER_SITE_OTHER): Payer: Medicare Other | Admitting: Psychiatry

## 2013-02-14 VITALS — BP 134/78 | HR 72 | Ht 69.0 in | Wt 149.8 lb

## 2013-02-14 DIAGNOSIS — F329 Major depressive disorder, single episode, unspecified: Secondary | ICD-10-CM

## 2013-02-14 MED ORDER — BUPROPION HCL ER (XL) 150 MG PO TB24: 150.0000 mg | ORAL_TABLET | ORAL | Status: DC

## 2013-02-14 MED ORDER — FLUOXETINE HCL 40 MG PO CAPS: ORAL_CAPSULE | ORAL | Status: DC

## 2013-02-14 NOTE — Progress Notes (Signed)
[facility] [id number] Progress Note  Colin Galvan [id number] 67 y.o.  02/14/2013 11:13 AM  Chief Complaint:   I'm taking Prozac 60 mg.  I feel sometimes irritable and angry.  I'm concerned about medication interaction.    History of Present Illness:   Colin Galvan came for his followup appointment.  He reported taking Prozac 60 mg along with Wellbutrin and trazodone.  It is unclear why he is taking Prozac 60 mg because dose was adjusted to 30 mg.  He is concerned about medication interaction.  He received a letter about drug drug interaction.  Letter his stated today prohibition with Prozac and trazodone.  However patient denies any chest pain or any shortness of breath.  He's been taking this medication for a long time he has never complained of any symptoms.  However a past few weeks he admitted sometimes irritability anger and also delayed ejaculation which could be due to increasing Prozac.  He reported his prostate is normal however he is scheduled to see his urologist Dr. Frederik Galvan for complete examination.  He admitted going to the bathroom in the middle of the night .  He had a excellent vacation.  He went to [place] and had a good time.  He denies any crying spells, suicidal thoughts or homicidal thoughts.  Denies any tremors or shakes.  He denies any hallucination or any paranoia.  He continues to engage in volunteer work.  Suicidal Ideation: No Plan Formed: No Patient has means to carry out plan: No  Homicidal Ideation: No Plan Formed: No Patient has means to carry out plan: No  Review of Systems: Psychiatric: Agitation: Yes Hallucination: No Depressed Mood: No Insomnia: Yes Hypersomnia: No Altered Concentration: No Feels Worthless: No Grandiose Ideas: No Belief In Special Powers: No New/Increased Substance Abuse: No Compulsions: No  Neurologic: Headache: No Seizure: No Paresthesias: No  Medical History;  Patient has GERD, gout, history of melanoma and  hypertension.  His primary care physician is Dr. Malachi Pro who is also a close friend of the patient.  Psychosocial History:  Patient lives in [place].  Patient denies any history of physical sexual or emotional abuse.  He's been married twice.  Patient endorsed involvement in multiple community activities.  Outpatient Encounter Prescriptions as of 02/14/2013  Medication Sig  . allopurinol (ZYLOPRIM) 300 MG tablet Take 300 mg by mouth daily.    Marland Kitchen aspirin 81 MG tablet Take 81 mg by mouth daily.    Marland Kitchen buPROPion (WELLBUTRIN XL) 150 MG 24 hr tablet Take 1 tablet (150 mg total) by mouth every morning.  . docusate sodium (STOOL SOFTENER) 100 MG capsule Take 100 mg by mouth 2 (two) times daily as needed.    Marland Kitchen FLUoxetine (PROZAC) 40 MG capsule Take 1 capsule daily  . fluticasone (FLONASE) 50 MCG/ACT nasal spray Place 2 sprays into the nose 3 times/day as needed-between meals & bedtime.   . IPRATROPIUM BROMIDE NA Place 2 sprays into the nose daily.  Marland Kitchen losartan (COZAAR) 50 MG tablet Take 50 mg by mouth daily.  . metoprolol succinate (TOPROL-XL) 25 MG 24 hr tablet Take 25 mg by mouth daily.  . Multiple Vitamin (MULTIVITAMIN) capsule Take 1 capsule by mouth daily.    Marland Kitchen omeprazole (PRILOSEC) 20 MG capsule Take 20 mg by mouth daily.    . ranitidine (ZANTAC) 150 MG tablet Take 150 mg by mouth daily.  . simvastatin (ZOCOR) 20 MG tablet Take 20 mg by mouth at bedtime.    Marland Kitchen  traZODone (DESYREL) 100 MG tablet Take 200 mg by mouth at bedtime.    . tretinoin (RETIN-A) 0.05 % cream   . [DISCONTINUED] buPROPion (WELLBUTRIN XL) 150 MG 24 hr tablet Take 1 tablet (150 mg total) by mouth every morning.  . [DISCONTINUED] FLUoxetine (PROZAC) 10 MG capsule Take 3 capsule daily    No results found for this or any previous visit (from the past 72 hour(s)).  Past Psychiatric History/Hospitalization(s)  Patient has history of depression for almost 20 years.  In the past he has tried Cymbalta, Klonopin and  Prestiq.  Patient denies any history of mania or psychosis.  He denies any history of psychiatric inpatient treatment or any suicidal attempt. Anxiety: Yes Bipolar Disorder: No Depression: Yes Mania: No Psychosis: No Schizophrenia: No Personality Disorder: No Hospitalization for psychiatric illness: No History of Electroconvulsive Shock Therapy: No Prior Suicide Attempts: No  Physical Exam: Constitutional:  BP 134/78  Pulse 72  Ht 5\' 9"  (1.753 m)  Wt 149 lb 12.8 oz (67.949 kg)  BMI 22.11 kg/m2  Musculoskeletal: Strength & Muscle Tone: within normal limits Gait & Station: normal Patient leans: N/A  Mental Status Examination;  Patient is well dressed and well groomed man who appears to be in his stated age.  He is pleasant and cooperative.  His speech is slow but clear and coherent.  His thought processes logical.  He denies any active or passive suicidal thoughts and homicidal thoughts.  He denies any auditory or visual hallucination.  There were no paranoia or delusions present at this time.  He described his mood okay and his affect is constricted.  There were no tremors or shakes.  His attention and concentration is fair.  He is alert and oriented x3.  His insight judgment and impulse control is okay.   Medical Decision Making (Choose Three): Established Problem, Stable/Improving (1), Decision to obtain old records (1), Review of Last Therapy Session (1), Review of Medication Regimen & Side Effects (2) and Review of New Medication or Change in Dosage (2)  Assessment: Axis I: Depressive disorder NOS  Axis II: Deferred  Axis III: see medical history  Axis IV: Moderate   Plan: I reviewed the letter in dosage of Prozac.  It is unclear why he is taking Prozac 60 mg because it was prescribed 30 mg .  I recommended he reduce Prozac to 40 mg only and continue Wellbutrin and trazodone the present dose.  We will get collateral information from his primary care physician.  Recommend  to call us back if he is a question of a concern.  Followup in 3 months.  Patient is getting trazodone from his primary care physician.  Time spent 25 minutes.  More than 50% of the time spent in psychoeducation, counseling and coordination of care.  Discuss safety plan that anytime having active suicidal thoughts or homicidal thoughts then patient need to call 911 or go to the local emergency room.  Shakeya Kerkman T., MD 02/14/2013

## 2013-02-19 DIAGNOSIS — J3089 Other allergic rhinitis: Secondary | ICD-10-CM | POA: Diagnosis not present

## 2013-02-27 DIAGNOSIS — J3089 Other allergic rhinitis: Secondary | ICD-10-CM | POA: Diagnosis not present

## 2013-03-01 DIAGNOSIS — D235 Other benign neoplasm of skin of trunk: Secondary | ICD-10-CM | POA: Diagnosis not present

## 2013-03-01 DIAGNOSIS — D1779 Benign lipomatous neoplasm of other sites: Secondary | ICD-10-CM | POA: Diagnosis not present

## 2013-03-01 DIAGNOSIS — D2360 Other benign neoplasm of skin of unspecified upper limb, including shoulder: Secondary | ICD-10-CM | POA: Diagnosis not present

## 2013-03-01 DIAGNOSIS — R3915 Urgency of urination: Secondary | ICD-10-CM | POA: Diagnosis not present

## 2013-03-01 DIAGNOSIS — N400 Enlarged prostate without lower urinary tract symptoms: Secondary | ICD-10-CM | POA: Diagnosis not present

## 2013-03-01 DIAGNOSIS — D179 Benign lipomatous neoplasm, unspecified: Secondary | ICD-10-CM | POA: Diagnosis not present

## 2013-03-06 DIAGNOSIS — M25579 Pain in unspecified ankle and joints of unspecified foot: Secondary | ICD-10-CM | POA: Diagnosis not present

## 2013-03-06 DIAGNOSIS — S93409A Sprain of unspecified ligament of unspecified ankle, initial encounter: Secondary | ICD-10-CM | POA: Diagnosis not present

## 2013-03-07 DIAGNOSIS — J3089 Other allergic rhinitis: Secondary | ICD-10-CM | POA: Diagnosis not present

## 2013-03-08 DIAGNOSIS — N138 Other obstructive and reflux uropathy: Secondary | ICD-10-CM | POA: Diagnosis not present

## 2013-03-08 DIAGNOSIS — N401 Enlarged prostate with lower urinary tract symptoms: Secondary | ICD-10-CM | POA: Diagnosis not present

## 2013-03-08 DIAGNOSIS — Z87442 Personal history of urinary calculi: Secondary | ICD-10-CM | POA: Diagnosis not present

## 2013-03-12 DIAGNOSIS — J3089 Other allergic rhinitis: Secondary | ICD-10-CM | POA: Diagnosis not present

## 2013-03-19 DIAGNOSIS — J3089 Other allergic rhinitis: Secondary | ICD-10-CM | POA: Diagnosis not present

## 2013-03-27 DIAGNOSIS — M25579 Pain in unspecified ankle and joints of unspecified foot: Secondary | ICD-10-CM | POA: Diagnosis not present

## 2013-03-27 DIAGNOSIS — M25519 Pain in unspecified shoulder: Secondary | ICD-10-CM | POA: Diagnosis not present

## 2013-03-28 DIAGNOSIS — C434 Malignant melanoma of scalp and neck: Secondary | ICD-10-CM | POA: Diagnosis not present

## 2013-03-28 DIAGNOSIS — E559 Vitamin D deficiency, unspecified: Secondary | ICD-10-CM | POA: Diagnosis not present

## 2013-03-28 DIAGNOSIS — I10 Essential (primary) hypertension: Secondary | ICD-10-CM | POA: Diagnosis not present

## 2013-03-28 DIAGNOSIS — J45991 Cough variant asthma: Secondary | ICD-10-CM | POA: Diagnosis not present

## 2013-03-28 DIAGNOSIS — M109 Gout, unspecified: Secondary | ICD-10-CM | POA: Diagnosis not present

## 2013-03-28 DIAGNOSIS — E878 Other disorders of electrolyte and fluid balance, not elsewhere classified: Secondary | ICD-10-CM | POA: Diagnosis not present

## 2013-03-28 DIAGNOSIS — D179 Benign lipomatous neoplasm, unspecified: Secondary | ICD-10-CM | POA: Diagnosis not present

## 2013-03-28 DIAGNOSIS — N200 Calculus of kidney: Secondary | ICD-10-CM | POA: Diagnosis not present

## 2013-03-28 DIAGNOSIS — E785 Hyperlipidemia, unspecified: Secondary | ICD-10-CM | POA: Diagnosis not present

## 2013-03-28 DIAGNOSIS — F411 Generalized anxiety disorder: Secondary | ICD-10-CM | POA: Diagnosis not present

## 2013-03-29 DIAGNOSIS — M25519 Pain in unspecified shoulder: Secondary | ICD-10-CM | POA: Diagnosis not present

## 2013-04-11 DIAGNOSIS — J3089 Other allergic rhinitis: Secondary | ICD-10-CM | POA: Diagnosis not present

## 2013-04-17 DIAGNOSIS — J3089 Other allergic rhinitis: Secondary | ICD-10-CM | POA: Diagnosis not present

## 2013-04-19 ENCOUNTER — Telehealth (HOSPITAL_COMMUNITY): Payer: Self-pay | Admitting: *Deleted

## 2013-04-19 NOTE — Telephone Encounter (Signed)
Left message for pt: Informed pt that Dr.Arfeen had asked that he be called [id number] from pt with note to PCP from pharmacy management service enclosed, regarding possible medication interaction.Asked pt to contact office at his convenience.

## 2013-04-23 DIAGNOSIS — J3089 Other allergic rhinitis: Secondary | ICD-10-CM | POA: Diagnosis not present

## 2013-04-25 ENCOUNTER — Telehealth (HOSPITAL_COMMUNITY): Payer: Self-pay | Admitting: *Deleted

## 2013-04-25 DIAGNOSIS — F329 Major depressive disorder, single episode, unspecified: Secondary | ICD-10-CM

## 2013-04-25 DIAGNOSIS — F3289 Other specified depressive episodes: Secondary | ICD-10-CM

## 2013-04-25 MED ORDER — MIRTAZAPINE 15 MG PO TABS
15.0000 mg | ORAL_TABLET | Freq: Every day | ORAL | Status: DC
Start: 2013-04-25 — End: 2013-10-25

## 2013-04-25 NOTE — Telephone Encounter (Signed)
04/16/13:MD provided letter from patient dated 01/29/13.Patient had rec'vd from primary care MD a letter advising of possible QT interval prolongation while on Fluoxetine and Trazodone. Patient requested Dr. Adele Schilder review and recommend another medication for Trazodone. Contacted pt 04/25/13.Advised pt that Dr. Adele Schilder stated pt could stop Trazodone and change to Remeron 15 mg at bedtime. Patient wants to do this, requested new RX for Remeron be sent to [facility] in [place], [place]

## 2013-05-02 DIAGNOSIS — J3089 Other allergic rhinitis: Secondary | ICD-10-CM | POA: Diagnosis not present

## 2013-05-07 ENCOUNTER — Ambulatory Visit (HOSPITAL_COMMUNITY): Payer: Self-pay | Admitting: Psychiatry

## 2013-05-16 ENCOUNTER — Ambulatory Visit (INDEPENDENT_AMBULATORY_CARE_PROVIDER_SITE_OTHER): Payer: Medicare Other | Admitting: Psychiatry

## 2013-05-16 ENCOUNTER — Encounter (HOSPITAL_COMMUNITY): Payer: Self-pay | Admitting: Psychiatry

## 2013-05-16 VITALS — BP 148/88 | HR 75 | Ht 69.0 in | Wt 151.8 lb

## 2013-05-16 DIAGNOSIS — F329 Major depressive disorder, single episode, unspecified: Secondary | ICD-10-CM | POA: Diagnosis not present

## 2013-05-16 DIAGNOSIS — F3289 Other specified depressive episodes: Secondary | ICD-10-CM | POA: Diagnosis not present

## 2013-05-16 MED ORDER — FLUOXETINE HCL 40 MG PO CAPS: ORAL_CAPSULE | ORAL | Status: DC

## 2013-05-16 MED ORDER — BUPROPION HCL ER (XL) 150 MG PO TB24: 150.0000 mg | ORAL_TABLET | ORAL | Status: DC

## 2013-05-16 NOTE — Progress Notes (Signed)
[facility] [phone] Progress Note  Colin Galvan [id number] 68 y.o.  05/16/2013 10:33 AM  Chief Complaint: I tried Remeron 3 times but did not help.  I have noticed more irritability, anger, short temper and depression.    History of Present Illness:   Colin Galvan came for his followup appointment.  He is taking Prozac 40 mg which was reduced on his last visit from 60 mg.  He's also taking Wellbutrin 150 mg daily.  We have tried Remeron because he received a letter about prolonged QTC with Prozac and trazodone combination.  Patient admitted that he tried 3 times a Remeron but did not slept well and now he is back to trazodone.  Patient never had any EKG.  He admitted sleeping better with trazodone but he denies any motivation and energy to do many things.  He admitted sometimes anxiety and admitted more isolative and withdrawn.  He denies any crying spells, suicidal thoughts or homicidal thoughts.  He denies any aggressive behavior.  He denies any paranoia or any hallucination.  I do not believe lowering Prozac has caused worsening of irritability because he had in the past .  Patient is taking combination of Prozac Wellbutrin and trazodone for a long time and he do not recall any chest pain and palpitations.  I spoke to his primary care physician Dr. Ramon Dredge is also his close friend at [phone].  He endorsed that patient does sometime self-medication.  He has no EKG but he promised her that EKG will be done soon to find about his QTC.  Suicidal Ideation: No Plan Formed: No Patient has means to carry out plan: No  Homicidal Ideation: No Plan Formed: No Patient has means to carry out plan: No  Review of Systems: Psychiatric: Agitation: Yes Hallucination: No Depressed Mood: No Insomnia: Yes Hypersomnia: No Altered Concentration: No Feels Worthless: No Grandiose Ideas: No Belief In Special Powers: No New/Increased Substance Abuse: No Compulsions:  No  Neurologic: Headache: No Seizure: No Paresthesias: No  Medical History;  Patient has GERD, gout, history of melanoma and hypertension.  His primary care physician is Dr. [name] who is also a close friend of the patient.  Psychosocial History:  Patient lives in [place].  Patient denies any history of physical sexual or emotional abuse.  He's been married twice.  Patient endorsed involvement in multiple community activities.  Outpatient Encounter Prescriptions as of 05/16/2013  Medication Sig  . buPROPion (WELLBUTRIN XL) 150 MG 24 hr tablet Take 1 tablet (150 mg total) by mouth every morning.  Marland Kitchen FLUoxetine (PROZAC) 40 MG capsule Take 1 capsule daily  . [DISCONTINUED] buPROPion (WELLBUTRIN XL) 150 MG 24 hr tablet Take 1 tablet (150 mg total) by mouth every morning.  . [DISCONTINUED] FLUoxetine (PROZAC) 40 MG capsule Take 1 capsule daily  . allopurinol (ZYLOPRIM) 300 MG tablet Take 300 mg by mouth daily.    Marland Kitchen aspirin 81 MG tablet Take 81 mg by mouth daily.    Marland Kitchen docusate sodium (STOOL SOFTENER) 100 MG capsule Take 100 mg by mouth 2 (two) times daily as needed.    . fluticasone (FLONASE) 50 MCG/ACT nasal spray Place 2 sprays into the nose 3 times/day as needed-between meals & bedtime.   . IPRATROPIUM BROMIDE NA Place 2 sprays into the nose daily.  Marland Kitchen losartan (COZAAR) 50 MG tablet Take 50 mg by mouth daily.  . metoprolol succinate (TOPROL-XL) 25 MG 24 hr tablet Take 25 mg by mouth daily.  . mirtazapine (  REMERON) 15 MG tablet Take 1 tablet (15 mg total) by mouth at bedtime.  . Multiple Vitamin (MULTIVITAMIN) capsule Take 1 capsule by mouth daily.    Marland Kitchen omeprazole (PRILOSEC) 20 MG capsule Take 20 mg by mouth daily.    . ranitidine (ZANTAC) 150 MG tablet Take 150 mg by mouth daily.  . simvastatin (ZOCOR) 20 MG tablet Take 20 mg by mouth at bedtime.    . tretinoin (RETIN-A) 0.05 % cream     No results found for this or any previous visit (from the past 72 hour(s)).  Past  Psychiatric History/Hospitalization(s)  Patient has history of depression for almost 20 years.  In the past he has tried Cymbalta, Klonopin and Prestiq.  Patient denies any history of mania or psychosis.  He denies any history of psychiatric inpatient treatment or any suicidal attempt. Anxiety: Yes Bipolar Disorder: No Depression: Yes Mania: No Psychosis: No Schizophrenia: No Personality Disorder: No Hospitalization for psychiatric illness: No History of Electroconvulsive Shock Therapy: No Prior Suicide Attempts: No  Physical Exam: Constitutional:  BP 148/88  Pulse 75  Ht 5\' 9"  (1.753 m)  Wt 151 lb 12.8 oz (68.856 kg)  BMI 22.41 kg/m2  Musculoskeletal: Strength & Muscle Tone: within normal limits Gait & Station: normal Patient leans: N/A  Mental Status Examination;  Patient is well dressed and well groomed man who appears to be in his stated age.  He is anxious but cooperative  His speech is slow but clear and coherent.  His thought processes logical.  He denies any active or passive suicidal thoughts and homicidal thoughts.  He denies any auditory or visual hallucination.  There were no paranoia or delusions present at this time.  He described his mood okay and his affect is constricted.  There were no tremors or shakes.  His fund of knowledge is adequate.  His attention and concentration is fair.  He is alert and oriented x3.  His insight judgment and impulse control is okay.  Established Problem, Stable/Improving (1), Review of Psycho-Social Stressors (1), Discuss test with performing physician (1), Decision to obtain old records (1), Established Problem, Worsening (2), Review of Last Therapy Session (1), Review of Medication Regimen & Side Effects (2) and Review of New Medication or Change in Dosage (2)  Assessment: Axis I: Depressive disorder NOS  Axis II: Deferred  Axis III: see medical history  Axis IV: Moderate   Plan: I had a long discussion about QTC, drug drug  interaction and medication side effects.  Patient has not tried Remeron long enough.  He admitted taking only 3 days but stopped because of poor sleep.  I encouraged him to take the Remeron for at least 2 weeks to see the response.  I explained that Remeron may help his depression and insomnia.  We also talked about other alternatives which include Vistaril, Lamictal but patient like to research on these medication before he starts.  I also called his primary care physician Dr. Ramon Dredge who will do the EKG.  At this time patient is taking trazodone however I recommended to stop until he has EKG and he should try Remeron 15 mg at bedtime.  Continue Prozac 40 mg and Wellbutrin.  I also offered to see therapist but patient does not feel that he needed therapy at this time.  I recommended to call us back if he has any question or any concern.  I will see him again in 3 months. Time spent 25 minutes.  More than 50%  of the time spent in psychoeducation, counseling and coordination of care.  Discuss safety plan that anytime having active suicidal thoughts or homicidal thoughts then patient need to call 911 or go to the local emergency room.  ARFEEN,SYED T., MD 05/16/2013

## 2013-05-21 DIAGNOSIS — J3089 Other allergic rhinitis: Secondary | ICD-10-CM | POA: Diagnosis not present

## 2013-05-30 DIAGNOSIS — D235 Other benign neoplasm of skin of trunk: Secondary | ICD-10-CM | POA: Diagnosis not present

## 2013-05-30 DIAGNOSIS — D485 Neoplasm of uncertain behavior of skin: Secondary | ICD-10-CM | POA: Diagnosis not present

## 2013-05-30 DIAGNOSIS — L819 Disorder of pigmentation, unspecified: Secondary | ICD-10-CM | POA: Diagnosis not present

## 2013-05-30 DIAGNOSIS — Z85828 Personal history of other malignant neoplasm of skin: Secondary | ICD-10-CM | POA: Diagnosis not present

## 2013-05-30 DIAGNOSIS — L570 Actinic keratosis: Secondary | ICD-10-CM | POA: Diagnosis not present

## 2013-05-31 DIAGNOSIS — J3089 Other allergic rhinitis: Secondary | ICD-10-CM | POA: Diagnosis not present

## 2013-06-05 DIAGNOSIS — J3089 Other allergic rhinitis: Secondary | ICD-10-CM | POA: Diagnosis not present

## 2013-06-07 DIAGNOSIS — I10 Essential (primary) hypertension: Secondary | ICD-10-CM | POA: Diagnosis not present

## 2013-06-07 DIAGNOSIS — E785 Hyperlipidemia, unspecified: Secondary | ICD-10-CM | POA: Diagnosis not present

## 2013-06-07 DIAGNOSIS — R9431 Abnormal electrocardiogram [ECG] [EKG]: Secondary | ICD-10-CM | POA: Diagnosis not present

## 2013-06-14 DIAGNOSIS — J3089 Other allergic rhinitis: Secondary | ICD-10-CM | POA: Diagnosis not present

## 2013-06-15 DIAGNOSIS — M2669 Other specified disorders of temporomandibular joint: Secondary | ICD-10-CM | POA: Diagnosis not present

## 2013-06-18 DIAGNOSIS — I10 Essential (primary) hypertension: Secondary | ICD-10-CM | POA: Diagnosis not present

## 2013-06-18 DIAGNOSIS — N200 Calculus of kidney: Secondary | ICD-10-CM | POA: Diagnosis not present

## 2013-06-18 DIAGNOSIS — C434 Malignant melanoma of scalp and neck: Secondary | ICD-10-CM | POA: Diagnosis not present

## 2013-06-18 DIAGNOSIS — E559 Vitamin D deficiency, unspecified: Secondary | ICD-10-CM | POA: Diagnosis not present

## 2013-06-18 DIAGNOSIS — F411 Generalized anxiety disorder: Secondary | ICD-10-CM | POA: Diagnosis not present

## 2013-06-18 DIAGNOSIS — R0602 Shortness of breath: Secondary | ICD-10-CM | POA: Diagnosis not present

## 2013-06-18 DIAGNOSIS — M2550 Pain in unspecified joint: Secondary | ICD-10-CM | POA: Diagnosis not present

## 2013-06-18 DIAGNOSIS — M109 Gout, unspecified: Secondary | ICD-10-CM | POA: Diagnosis not present

## 2013-06-18 DIAGNOSIS — J3089 Other allergic rhinitis: Secondary | ICD-10-CM | POA: Diagnosis not present

## 2013-06-26 DIAGNOSIS — J3089 Other allergic rhinitis: Secondary | ICD-10-CM | POA: Diagnosis not present

## 2013-06-28 DIAGNOSIS — I10 Essential (primary) hypertension: Secondary | ICD-10-CM | POA: Diagnosis not present

## 2013-07-13 DIAGNOSIS — R5381 Other malaise: Secondary | ICD-10-CM | POA: Diagnosis not present

## 2013-07-13 DIAGNOSIS — R5383 Other fatigue: Secondary | ICD-10-CM | POA: Diagnosis not present

## 2013-07-13 DIAGNOSIS — L299 Pruritus, unspecified: Secondary | ICD-10-CM | POA: Diagnosis not present

## 2013-07-13 DIAGNOSIS — K909 Intestinal malabsorption, unspecified: Secondary | ICD-10-CM | POA: Diagnosis not present

## 2013-07-18 DIAGNOSIS — I10 Essential (primary) hypertension: Secondary | ICD-10-CM | POA: Diagnosis not present

## 2013-07-20 DIAGNOSIS — M2669 Other specified disorders of temporomandibular joint: Secondary | ICD-10-CM | POA: Diagnosis not present

## 2013-07-20 DIAGNOSIS — M278 Other specified diseases of jaws: Secondary | ICD-10-CM | POA: Diagnosis not present

## 2013-08-08 DIAGNOSIS — R198 Other specified symptoms and signs involving the digestive system and abdomen: Secondary | ICD-10-CM | POA: Diagnosis not present

## 2013-08-08 DIAGNOSIS — L299 Pruritus, unspecified: Secondary | ICD-10-CM | POA: Diagnosis not present

## 2013-08-09 DIAGNOSIS — G5760 Lesion of plantar nerve, unspecified lower limb: Secondary | ICD-10-CM | POA: Diagnosis not present

## 2013-08-09 DIAGNOSIS — M25579 Pain in unspecified ankle and joints of unspecified foot: Secondary | ICD-10-CM | POA: Diagnosis not present

## 2013-08-15 ENCOUNTER — Ambulatory Visit (HOSPITAL_COMMUNITY): Payer: Self-pay | Admitting: Psychiatry

## 2013-08-16 ENCOUNTER — Ambulatory Visit (INDEPENDENT_AMBULATORY_CARE_PROVIDER_SITE_OTHER): Payer: Medicare Other | Admitting: Psychology

## 2013-08-16 DIAGNOSIS — F3289 Other specified depressive episodes: Secondary | ICD-10-CM | POA: Diagnosis not present

## 2013-08-16 DIAGNOSIS — F329 Major depressive disorder, single episode, unspecified: Secondary | ICD-10-CM | POA: Diagnosis not present

## 2013-08-20 ENCOUNTER — Encounter (HOSPITAL_COMMUNITY): Payer: Self-pay | Admitting: Psychology

## 2013-08-20 NOTE — Progress Notes (Signed)
   PROGRESS NOTE  The patient comes in today reporting that he and his wife has been doing very well until recently. He reports that he had a similar situation where his wife felt that he did not respond to her in a supportive manner and instead of dealing with it when it happened that he withdrew from the situation which she feels like maybe situation worse. The patient reports that he and his wife had very little communication for almost a week after this. We worked on better coping skills and strategies that he could use to try to improve responses to situations like this.   Edgardo Roys, PsyD 08/20/2013

## 2013-09-07 DIAGNOSIS — M2669 Other specified disorders of temporomandibular joint: Secondary | ICD-10-CM | POA: Diagnosis not present

## 2013-09-12 ENCOUNTER — Ambulatory Visit (INDEPENDENT_AMBULATORY_CARE_PROVIDER_SITE_OTHER): Payer: Medicare Other | Admitting: Psychology

## 2013-09-12 DIAGNOSIS — F329 Major depressive disorder, single episode, unspecified: Secondary | ICD-10-CM | POA: Diagnosis not present

## 2013-09-12 DIAGNOSIS — F3289 Other specified depressive episodes: Secondary | ICD-10-CM | POA: Diagnosis not present

## 2013-09-13 ENCOUNTER — Encounter (HOSPITAL_COMMUNITY): Payer: Self-pay | Admitting: Psychology

## 2013-09-13 NOTE — Progress Notes (Signed)
   PROGRESS NOTE  The patient reports that things have continued to deteriorate between he and his wife. They have been talking about divorce sensations. The patient he did not want this to happen in any way but he doesn't knowledge that there is a difficulties and problems between he and his wife. Patient reports that she is becoming increasingly frustrated with the situation. To work on Radiographer, therapeutic and strategies are in issues of depression and better management and dealing with in coping with his relationship with his wife.   Edgardo Roys, PsyD 09/13/2013

## 2013-09-18 DIAGNOSIS — C434 Malignant melanoma of scalp and neck: Secondary | ICD-10-CM | POA: Diagnosis not present

## 2013-09-18 DIAGNOSIS — E785 Hyperlipidemia, unspecified: Secondary | ICD-10-CM | POA: Diagnosis not present

## 2013-09-18 DIAGNOSIS — F411 Generalized anxiety disorder: Secondary | ICD-10-CM | POA: Diagnosis not present

## 2013-09-18 DIAGNOSIS — M109 Gout, unspecified: Secondary | ICD-10-CM | POA: Diagnosis not present

## 2013-09-18 DIAGNOSIS — J45991 Cough variant asthma: Secondary | ICD-10-CM | POA: Diagnosis not present

## 2013-09-18 DIAGNOSIS — R0602 Shortness of breath: Secondary | ICD-10-CM | POA: Diagnosis not present

## 2013-09-18 DIAGNOSIS — J3089 Other allergic rhinitis: Secondary | ICD-10-CM | POA: Diagnosis not present

## 2013-09-18 DIAGNOSIS — N200 Calculus of kidney: Secondary | ICD-10-CM | POA: Diagnosis not present

## 2013-09-19 DIAGNOSIS — H109 Unspecified conjunctivitis: Secondary | ICD-10-CM | POA: Diagnosis not present

## 2013-09-19 DIAGNOSIS — I10 Essential (primary) hypertension: Secondary | ICD-10-CM | POA: Diagnosis not present

## 2013-09-19 DIAGNOSIS — H526 Other disorders of refraction: Secondary | ICD-10-CM | POA: Diagnosis not present

## 2013-09-19 DIAGNOSIS — H04129 Dry eye syndrome of unspecified lacrimal gland: Secondary | ICD-10-CM | POA: Diagnosis not present

## 2013-09-19 DIAGNOSIS — E785 Hyperlipidemia, unspecified: Secondary | ICD-10-CM | POA: Diagnosis not present

## 2013-09-28 DIAGNOSIS — M47812 Spondylosis without myelopathy or radiculopathy, cervical region: Secondary | ICD-10-CM | POA: Diagnosis not present

## 2013-09-28 DIAGNOSIS — M7710 Lateral epicondylitis, unspecified elbow: Secondary | ICD-10-CM | POA: Diagnosis not present

## 2013-10-04 ENCOUNTER — Other Ambulatory Visit (HOSPITAL_COMMUNITY): Payer: Self-pay | Admitting: Psychiatry

## 2013-10-09 ENCOUNTER — Telehealth (HOSPITAL_COMMUNITY): Payer: Self-pay

## 2013-10-11 ENCOUNTER — Other Ambulatory Visit (HOSPITAL_COMMUNITY): Payer: Self-pay | Admitting: Psychiatry

## 2013-10-11 ENCOUNTER — Other Ambulatory Visit (HOSPITAL_COMMUNITY): Payer: Self-pay | Admitting: *Deleted

## 2013-10-11 DIAGNOSIS — F3289 Other specified depressive episodes: Secondary | ICD-10-CM

## 2013-10-11 DIAGNOSIS — F329 Major depressive disorder, single episode, unspecified: Secondary | ICD-10-CM

## 2013-10-11 MED ORDER — FLUOXETINE HCL 40 MG PO CAPS: ORAL_CAPSULE | ORAL | Status: DC

## 2013-10-12 DIAGNOSIS — M2669 Other specified disorders of temporomandibular joint: Secondary | ICD-10-CM | POA: Diagnosis not present

## 2013-10-16 ENCOUNTER — Ambulatory Visit (INDEPENDENT_AMBULATORY_CARE_PROVIDER_SITE_OTHER): Payer: Medicare Other | Admitting: Psychology

## 2013-10-16 DIAGNOSIS — F329 Major depressive disorder, single episode, unspecified: Secondary | ICD-10-CM | POA: Diagnosis not present

## 2013-10-16 DIAGNOSIS — F3289 Other specified depressive episodes: Secondary | ICD-10-CM | POA: Diagnosis not present

## 2013-10-17 ENCOUNTER — Encounter (HOSPITAL_COMMUNITY): Payer: Self-pay | Admitting: Psychology

## 2013-10-17 DIAGNOSIS — H109 Unspecified conjunctivitis: Secondary | ICD-10-CM | POA: Diagnosis not present

## 2013-10-17 DIAGNOSIS — H526 Other disorders of refraction: Secondary | ICD-10-CM | POA: Diagnosis not present

## 2013-10-17 DIAGNOSIS — H2510 Age-related nuclear cataract, unspecified eye: Secondary | ICD-10-CM | POA: Diagnosis not present

## 2013-10-17 DIAGNOSIS — H04129 Dry eye syndrome of unspecified lacrimal gland: Secondary | ICD-10-CM | POA: Diagnosis not present

## 2013-10-17 NOTE — Progress Notes (Signed)
   PROGRESS NOTE  The patient reports that the situation between his he and his wife is improved to some degree but continues to be problematic for him. He reports that she continues to challenge him about what he is doing for her. He is trying to get her to finish a book service doesn't continue to be an issue for them. However, the patient reports that he is becoming worn down by discontinued conflict between he and his wife. He says that he is 68 years old and should have been dealing with this much difficulty in his life on a daily basis.   Edgardo Roys, PsyD 10/17/2013

## 2013-10-24 DIAGNOSIS — M7710 Lateral epicondylitis, unspecified elbow: Secondary | ICD-10-CM | POA: Diagnosis not present

## 2013-10-24 DIAGNOSIS — E785 Hyperlipidemia, unspecified: Secondary | ICD-10-CM | POA: Diagnosis not present

## 2013-10-24 DIAGNOSIS — I10 Essential (primary) hypertension: Secondary | ICD-10-CM | POA: Diagnosis not present

## 2013-10-24 DIAGNOSIS — M47812 Spondylosis without myelopathy or radiculopathy, cervical region: Secondary | ICD-10-CM | POA: Diagnosis not present

## 2013-10-25 ENCOUNTER — Encounter (HOSPITAL_COMMUNITY): Payer: Self-pay | Admitting: Psychiatry

## 2013-10-25 ENCOUNTER — Ambulatory Visit (INDEPENDENT_AMBULATORY_CARE_PROVIDER_SITE_OTHER): Payer: Medicare Other | Admitting: Psychiatry

## 2013-10-25 VITALS — BP 155/96 | HR 66 | Ht 67.52 in | Wt 153.0 lb

## 2013-10-25 DIAGNOSIS — F329 Major depressive disorder, single episode, unspecified: Secondary | ICD-10-CM

## 2013-10-25 DIAGNOSIS — F3289 Other specified depressive episodes: Secondary | ICD-10-CM | POA: Diagnosis not present

## 2013-10-25 MED ORDER — BUPROPION HCL ER (XL) 150 MG PO TB24: ORAL_TABLET | ORAL | Status: DC

## 2013-10-25 MED ORDER — FLUOXETINE HCL 40 MG PO CAPS: ORAL_CAPSULE | ORAL | Status: DC

## 2013-10-25 NOTE — Progress Notes (Signed)
[facility] [phone] Progress Note  [name] [id number] 68 y.o.  10/25/2013 9:14 AM  Chief Complaint: Followup.      History of Present Illness:   Colin Galvan came for his followup appointment.  He was last seen in February.  He missed last appointment.  The patient recently had 68th birthday .  Patient admitted sometime he feels aged.  He is taking trazodone 200 mg as prescribed by his primary care physician along with Wellbutrin and Prozac which is prescribed from  this office.  On his last visit we recommended to try Remeron but patient did not try to mouth and he complaining of dreams .  We also discussed Lamictal and Vistaril on last visit and patient replied that he will do research.  Patient do not recall doing any research on medication .  However he is feeling that his short temper, anger and irritability is much better.  He is sleeping good.  He continues to have marital issues which resurface to gain on his 68th per day.  The patient told his wife mention about his controlling nature and he was disappointed with her comments.  His wife decided to see a psychologist and she had an appointment with Apolonio Schneiders.  Patient is also seeing a cardiologist and recently his antihypertensive medicine is change.  He has not picked up his new medication.  He is taking losartan.  Today his blood pressure was slightly increased.  We had a long discussion about QTC interval of Prozac and trazodone.  However patient denies any chest pain and recently he has EKG done by cardiologist and there has been no new issues.  Overall patient mood is been stable.  His irritability and anger is less intense from the past.  He is seeing Ilean Skill for counseling.  He admitted sometime forgetfulness which could be age related.  He denies any paranoia, hallucination, crying spells.  His energy level is low.  Due to recent injury in his palm he is not playing tennis and has been disappointed.  He is  trying to keep himself busy.  Patient is taking combination of Wellbutrin 150 mg daily Prozac 40 mg daily and trazodone 200 mg at bedtime.  Patient has no tremors or shakes.  He lives with his wife.  He is retired Chief Executive Officer.   Suicidal Ideation: No Plan Formed: No Patient has means to carry out plan: No  Homicidal Ideation: No Plan Formed: No Patient has means to carry out plan: No  Review of Systems: Psychiatric: Agitation: No Hallucination: No Depressed Mood: No Insomnia: No Hypersomnia: No Altered Concentration: No Feels Worthless: No Grandiose Ideas: No Belief In Special Powers: No New/Increased Substance Abuse: No Compulsions: No  Neurologic: Headache: No Seizure: No Paresthesias: No  Medical History;  Patient has GERD, gout, history of melanoma and hypertension.  His primary care physician is Dr. [name] who is also a close friend of the patient.  His cardiologist is Raechel Chute in [place].  Outpatient Encounter Prescriptions as of 10/25/2013  Medication Sig  . traZODone (DESYREL) 100 MG tablet Take 200 mg by mouth at bedtime.  . [DISCONTINUED] FLUoxetine (PROZAC) 40 MG capsule Take 1 capsule daily  . allopurinol (ZYLOPRIM) 300 MG tablet Take 300 mg by mouth daily.    Marland Kitchen aspirin 81 MG tablet Take 81 mg by mouth daily.    Marland Kitchen buPROPion (WELLBUTRIN XL) 150 MG 24 hr tablet TAKE ONE TABLET BY MOUTH ONCE DAILY IN THE MORNING  .  docusate sodium (STOOL SOFTENER) 100 MG capsule Take 100 mg by mouth 2 (two) times daily as needed.    Marland Kitchen FLUoxetine (PROZAC) 40 MG capsule Take 1 capsule daily  . fluticasone (FLONASE) 50 MCG/ACT nasal spray Place 2 sprays into the nose 3 times/day as needed-between meals & bedtime.   . IPRATROPIUM BROMIDE NA Place 2 sprays into the nose daily.  Marland Kitchen losartan (COZAAR) 50 MG tablet Take 50 mg by mouth daily.  . Multiple Vitamin (MULTIVITAMIN) capsule Take 1 capsule by mouth daily.    Marland Kitchen omeprazole (PRILOSEC) 20 MG capsule Take 20 mg by mouth daily.    .  ranitidine (ZANTAC) 150 MG tablet Take 150 mg by mouth daily.  . simvastatin (ZOCOR) 20 MG tablet Take 20 mg by mouth at bedtime.    . tretinoin (RETIN-A) 0.05 % cream   . [DISCONTINUED] buPROPion (WELLBUTRIN XL) 150 MG 24 hr tablet TAKE ONE TABLET BY MOUTH ONCE DAILY IN THE MORNING  . [DISCONTINUED] metoprolol succinate (TOPROL-XL) 25 MG 24 hr tablet Take 25 mg by mouth daily.  . [DISCONTINUED] mirtazapine (REMERON) 15 MG tablet Take 1 tablet (15 mg total) by mouth at bedtime.    No results found for this or any previous visit (from the past 72 hour(s)).  Past Psychiatric History/Hospitalization(s)  Patient has history of depression for almost 20 years.  In the past he has tried Cymbalta, Klonopin and Prestiq.  Patient denies any history of mania or psychosis.  He denies any history of psychiatric inpatient treatment or any suicidal attempt. Anxiety: Yes Bipolar Disorder: No Depression: Yes Mania: No Psychosis: No Schizophrenia: No Personality Disorder: No Hospitalization for psychiatric illness: No History of Electroconvulsive Shock Therapy: No Prior Suicide Attempts: No  Physical Exam: Constitutional:  BP 155/96  Pulse 66  Ht 5' 7.52" (1.715 m)  Wt 153 lb (69.4 kg)  BMI 23.60 kg/m2  Musculoskeletal: Strength & Muscle Tone: within normal limits Gait & Station: normal Patient leans: N/A  Mental Status Examination;  Patient is well dressed and well groomed man who appears to be in his stated age.  He is anxious but cooperative  His speech is slow but clear and coherent.  His thought process is slow but logical.  He denies any active or passive suicidal thoughts and homicidal thoughts.  He denies any auditory or visual hallucination.  There were no paranoia or delusions present at this time.  He described his mood is euthymic and his affect is mood appropriate.  There were no tremors or shakes.  His fund of knowledge is adequate.  His attention and concentration is fair.  He is  alert and oriented x3.  His insight judgment and impulse control is okay.  Established Problem, Stable/Improving (1), Review of Psycho-Social Stressors (1), Decision to obtain old records (1), Review of Last Therapy Session (1) and Review of Medication Regimen & Side Effects (2)  Assessment: Axis I: Depressive disorder NOS  Axis II: Deferred  Axis III: see medical history  Axis IV: Moderate   Plan: The patient does not want to change his antidepressant medication.  He is seen cardiologist recently changed his antihypertensive medication however patient has not picked up yet .  His blood pressure is high and he admitted not taking his medication this morning.  Discussed QTC interval the Prozac and trazodone the patient has recently seen cardiologist and he had discussed this issue with him .  Reassurance given.  Recommended to continue Prozac 40 mg daily and Wellbutrin 150 mg  in the morning.  Recommended to continue counseling with Dr Sima Matas.  at this time patient is not interested to try Lamictal or Vistaril .  We will get records from his primary care physician and cardiologist.  I will see him again in 3 months.  Reinforced followup appointments.  Rrecommended to call us back if he has any question or any concern.  I will see him again in 3 months. Time spent 25 minutes.  More than 50% of the time spent in psychoeducation, counseling and coordination of care.  Discuss safety plan that anytime having active suicidal thoughts or homicidal thoughts then patient need to call 911 or go to the local emergency room.  Colin Galvan T., MD 10/25/2013

## 2013-10-29 DIAGNOSIS — M542 Cervicalgia: Secondary | ICD-10-CM | POA: Diagnosis not present

## 2013-10-29 DIAGNOSIS — M47812 Spondylosis without myelopathy or radiculopathy, cervical region: Secondary | ICD-10-CM | POA: Diagnosis not present

## 2013-10-29 DIAGNOSIS — M7710 Lateral epicondylitis, unspecified elbow: Secondary | ICD-10-CM | POA: Diagnosis not present

## 2013-10-29 DIAGNOSIS — M25529 Pain in unspecified elbow: Secondary | ICD-10-CM | POA: Diagnosis not present

## 2013-10-30 DIAGNOSIS — L570 Actinic keratosis: Secondary | ICD-10-CM | POA: Diagnosis not present

## 2013-10-30 DIAGNOSIS — L298 Other pruritus: Secondary | ICD-10-CM | POA: Diagnosis not present

## 2013-10-30 DIAGNOSIS — L2989 Other pruritus: Secondary | ICD-10-CM | POA: Diagnosis not present

## 2013-10-30 DIAGNOSIS — L739 Follicular disorder, unspecified: Secondary | ICD-10-CM | POA: Diagnosis not present

## 2013-10-30 DIAGNOSIS — L820 Inflamed seborrheic keratosis: Secondary | ICD-10-CM | POA: Diagnosis not present

## 2013-10-30 DIAGNOSIS — L821 Other seborrheic keratosis: Secondary | ICD-10-CM | POA: Diagnosis not present

## 2013-11-01 DIAGNOSIS — Z86010 Personal history of colonic polyps: Secondary | ICD-10-CM | POA: Diagnosis not present

## 2013-11-01 DIAGNOSIS — K219 Gastro-esophageal reflux disease without esophagitis: Secondary | ICD-10-CM | POA: Diagnosis not present

## 2013-11-02 DIAGNOSIS — M25529 Pain in unspecified elbow: Secondary | ICD-10-CM | POA: Diagnosis not present

## 2013-11-02 DIAGNOSIS — M542 Cervicalgia: Secondary | ICD-10-CM | POA: Diagnosis not present

## 2013-11-06 DIAGNOSIS — M542 Cervicalgia: Secondary | ICD-10-CM | POA: Diagnosis not present

## 2013-11-06 DIAGNOSIS — M25529 Pain in unspecified elbow: Secondary | ICD-10-CM | POA: Diagnosis not present

## 2013-11-07 ENCOUNTER — Encounter (HOSPITAL_COMMUNITY): Payer: Self-pay | Admitting: Psychology

## 2013-11-07 ENCOUNTER — Ambulatory Visit (INDEPENDENT_AMBULATORY_CARE_PROVIDER_SITE_OTHER): Payer: Medicare Other | Admitting: Psychology

## 2013-11-07 DIAGNOSIS — F3289 Other specified depressive episodes: Secondary | ICD-10-CM

## 2013-11-07 DIAGNOSIS — F329 Major depressive disorder, single episode, unspecified: Secondary | ICD-10-CM | POA: Diagnosis not present

## 2013-11-07 NOTE — Progress Notes (Signed)
   PROGRESS NOTE  The patient continues to have stress and frustration with marital issues.  Worked on building further coping skills around these issues.  The patient reports that he has had some issues with depressive events but these continue to improve.  Edgardo Roys, PsyD 11/07/2013

## 2013-11-08 DIAGNOSIS — M25529 Pain in unspecified elbow: Secondary | ICD-10-CM | POA: Diagnosis not present

## 2013-11-08 DIAGNOSIS — M542 Cervicalgia: Secondary | ICD-10-CM | POA: Diagnosis not present

## 2013-11-13 DIAGNOSIS — M542 Cervicalgia: Secondary | ICD-10-CM | POA: Diagnosis not present

## 2013-11-13 DIAGNOSIS — M25529 Pain in unspecified elbow: Secondary | ICD-10-CM | POA: Diagnosis not present

## 2013-11-14 ENCOUNTER — Encounter: Payer: Self-pay | Admitting: Gastroenterology

## 2013-11-16 DIAGNOSIS — M25529 Pain in unspecified elbow: Secondary | ICD-10-CM | POA: Diagnosis not present

## 2013-11-16 DIAGNOSIS — M542 Cervicalgia: Secondary | ICD-10-CM | POA: Diagnosis not present

## 2013-11-20 DIAGNOSIS — M542 Cervicalgia: Secondary | ICD-10-CM | POA: Diagnosis not present

## 2013-11-20 DIAGNOSIS — M25529 Pain in unspecified elbow: Secondary | ICD-10-CM | POA: Diagnosis not present

## 2013-11-21 DIAGNOSIS — Z86010 Personal history of colonic polyps: Secondary | ICD-10-CM | POA: Diagnosis not present

## 2013-11-21 DIAGNOSIS — D126 Benign neoplasm of colon, unspecified: Secondary | ICD-10-CM | POA: Diagnosis not present

## 2013-11-21 DIAGNOSIS — K620 Anal polyp: Secondary | ICD-10-CM | POA: Diagnosis not present

## 2013-11-21 DIAGNOSIS — K2900 Acute gastritis without bleeding: Secondary | ICD-10-CM | POA: Diagnosis not present

## 2013-11-21 DIAGNOSIS — K621 Rectal polyp: Secondary | ICD-10-CM | POA: Diagnosis not present

## 2013-11-21 DIAGNOSIS — K219 Gastro-esophageal reflux disease without esophagitis: Secondary | ICD-10-CM | POA: Diagnosis not present

## 2013-11-23 DIAGNOSIS — J309 Allergic rhinitis, unspecified: Secondary | ICD-10-CM | POA: Diagnosis not present

## 2013-11-23 DIAGNOSIS — J342 Deviated nasal septum: Secondary | ICD-10-CM | POA: Diagnosis not present

## 2013-11-23 DIAGNOSIS — J3489 Other specified disorders of nose and nasal sinuses: Secondary | ICD-10-CM | POA: Diagnosis not present

## 2013-11-27 DIAGNOSIS — M7710 Lateral epicondylitis, unspecified elbow: Secondary | ICD-10-CM | POA: Diagnosis not present

## 2013-11-27 DIAGNOSIS — M7700 Medial epicondylitis, unspecified elbow: Secondary | ICD-10-CM | POA: Diagnosis not present

## 2013-11-27 DIAGNOSIS — M47812 Spondylosis without myelopathy or radiculopathy, cervical region: Secondary | ICD-10-CM | POA: Diagnosis not present

## 2013-11-28 DIAGNOSIS — E785 Hyperlipidemia, unspecified: Secondary | ICD-10-CM | POA: Diagnosis not present

## 2013-11-28 DIAGNOSIS — I10 Essential (primary) hypertension: Secondary | ICD-10-CM | POA: Diagnosis not present

## 2013-11-29 DIAGNOSIS — I119 Hypertensive heart disease without heart failure: Secondary | ICD-10-CM | POA: Diagnosis not present

## 2013-11-30 DIAGNOSIS — M2669 Other specified disorders of temporomandibular joint: Secondary | ICD-10-CM | POA: Diagnosis not present

## 2013-12-28 DIAGNOSIS — E785 Hyperlipidemia, unspecified: Secondary | ICD-10-CM | POA: Diagnosis not present

## 2013-12-28 DIAGNOSIS — I119 Hypertensive heart disease without heart failure: Secondary | ICD-10-CM | POA: Diagnosis not present

## 2014-01-17 ENCOUNTER — Ambulatory Visit (INDEPENDENT_AMBULATORY_CARE_PROVIDER_SITE_OTHER): Payer: Medicare Other | Admitting: Psychiatry

## 2014-01-17 ENCOUNTER — Encounter (HOSPITAL_COMMUNITY): Payer: Self-pay | Admitting: Psychiatry

## 2014-01-17 VITALS — BP 152/80 | HR 58 | Ht 69.0 in | Wt 153.6 lb

## 2014-01-17 DIAGNOSIS — F329 Major depressive disorder, single episode, unspecified: Secondary | ICD-10-CM | POA: Diagnosis not present

## 2014-01-17 DIAGNOSIS — F32A Depression, unspecified: Secondary | ICD-10-CM

## 2014-01-17 MED ORDER — FLUOXETINE HCL 20 MG PO CAPS: 20.0000 mg | ORAL_CAPSULE | Freq: Every day | ORAL | Status: DC

## 2014-01-17 MED ORDER — BUPROPION HCL ER (XL) 150 MG PO TB24: ORAL_TABLET | ORAL | Status: DC

## 2014-01-17 MED ORDER — LAMOTRIGINE 25 MG PO TABS: ORAL_TABLET | ORAL | Status: DC

## 2014-01-17 NOTE — Progress Notes (Signed)
[facility] [phone] Progress Note  Jeral Zick [id number] 68 y.o.  01/17/2014 9:07 AM  Chief Complaint: I bring my wife today.  I have irritability and anger.        History of Present Illness:   Mj came for his followup appointment with his wife.  Patient admitted that he has irritability and anger and short temper.  His wife believes he has a bipolar disorder however patient does not believe that he has bipolar disorder.  He is taking Prozac, Wellbutrin and trazodone.  In the past we have talked about adding stabilizer but patient is always very reluctant to change any his medication.  He is not seeing Dr. [name] lately.  Today his wife mentioned that patient has history of impulsive behavior, irritability, disinhibition in his behavior and outburst of energy.  She remember having examples of fluttering other girl while she was there and also knowing that wife has the problem wanted to move third floor which has no elevator.  She also mentioned that patient has impulsive buying and shopping .  His wife also endorse moments of irritability and unpredictability.  However patient and his wife denies any psychosis, aggression or violence.  Patient believe his short temper is coming from lack of energy and recent change in his blood pressure medication.  He is taking amlodipine and Coreg which she believed causing decreased energy in the evening where he himself shows more irritability.  Patient denies any insomnia, crying spells, active or passive suicidal thoughts or homicidal thought.  His appetite is okay.  His vitals are stable today.  He is scheduled to see his cardiologist 2 weeks from now.  His wife is seeing Dr. [name] and patient has not seen his psychologist.  Patient admitted that he has medical issues which has been long and chronic but admitted more intense in recent months.  He lives with his wife.  He is retired Chief Executive Officer.  Suicidal Ideation: No Plan Formed: No  Patient has means to carry out plan: No  Homicidal Ideation: No Plan Formed: No Patient has means to carry out plan: No  Review of Systems  Constitutional: Positive for malaise/fatigue.  Skin: Negative for itching and rash.  Psychiatric/Behavioral: Positive for depression. The patient is nervous/anxious.        Irritability   Psychiatric: Agitation: No Hallucination: No Depressed Mood: Yes Insomnia: No Hypersomnia: No Altered Concentration: No Feels Worthless: No Grandiose Ideas: No Belief In Special Powers: No New/Increased Substance Abuse: No Compulsions: No  Neurologic: Headache: No Seizure: No Paresthesias: No  Medical History;  Patient has GERD, gout, history of melanoma and hypertension.  His primary care physician is Dr. [name] who is also a close friend of the patient.  His cardiologist is Raechel Chute in [place].  Outpatient Encounter Prescriptions as of 01/17/2014  Medication Sig  . allopurinol (ZYLOPRIM) 300 MG tablet Take 300 mg by mouth daily.    Marland Kitchen aspirin 81 MG tablet Take 81 mg by mouth daily.    Marland Kitchen buPROPion (WELLBUTRIN XL) 150 MG 24 hr tablet TAKE ONE TABLET BY MOUTH ONCE DAILY IN THE MORNING  . docusate sodium (STOOL SOFTENER) 100 MG capsule Take 100 mg by mouth 2 (two) times daily as needed.    Marland Kitchen FLUoxetine (PROZAC) 20 MG capsule Take 1 capsule (20 mg total) by mouth daily.  . fluticasone (FLONASE) 50 MCG/ACT nasal spray Place 2 sprays into the nose 3 times/day as needed-between meals & bedtime.   . IPRATROPIUM  BROMIDE NA Place 2 sprays into the nose daily.  Marland Kitchen lamoTRIgine (LAMICTAL) 25 MG tablet Take 1 tab daily for 1 week and than 2 tab daily  . losartan (COZAAR) 50 MG tablet Take 50 mg by mouth daily.  . Multiple Vitamin (MULTIVITAMIN) capsule Take 1 capsule by mouth daily.    Marland Kitchen omeprazole (PRILOSEC) 20 MG capsule Take 20 mg by mouth daily.    . ranitidine (ZANTAC) 150 MG tablet Take 150 mg by mouth daily.  . simvastatin (ZOCOR) 20 MG tablet  Take 20 mg by mouth at bedtime.    . traZODone (DESYREL) 100 MG tablet Take 200 mg by mouth at bedtime.  . tretinoin (RETIN-A) 0.05 % cream   . [DISCONTINUED] buPROPion (WELLBUTRIN XL) 150 MG 24 hr tablet TAKE ONE TABLET BY MOUTH ONCE DAILY IN THE MORNING  . [DISCONTINUED] FLUoxetine (PROZAC) 40 MG capsule Take 1 capsule daily    No results found for this or any previous visit (from the past 72 hour(s)).  Past Psychiatric History/Hospitalization(s)  Patient has history of depression for almost 20 years.  In the past he has tried Cymbalta, Klonopin and Prestiq.  Patient denies any history of mania or psychosis.  He denies any history of psychiatric inpatient treatment or any suicidal attempt. Anxiety: Yes Bipolar Disorder: No Depression: Yes Mania: No Psychosis: No Schizophrenia: No Personality Disorder: No Hospitalization for psychiatric illness: No History of Electroconvulsive Shock Therapy: No Prior Suicide Attempts: No  Physical Exam: Constitutional:  BP 152/80  Pulse 58  Ht 5\' 9"  (1.753 m)  Wt 153 lb 9.6 oz (69.673 kg)  BMI 22.67 kg/m2  Musculoskeletal: Strength & Muscle Tone: within normal limits Gait & Station: normal Patient leans: N/A  Mental Status Examination;  Patient is well dressed and well groomed man who appears to be in his stated age.  Today he appears tense and anxious but cooperative  His speech is slow but clear and coherent.  His thought process is slow but logical.  He denies any active or passive suicidal thoughts and homicidal thoughts.  He denies any auditory or visual hallucination.  There were no paranoia or delusions present at this time.  He described his mood is euthymic and his affect is mood appropriate.  There were no tremors or shakes.  His fund of knowledge is adequate.  His attention and concentration is fair.  He is alert and oriented x3.  His insight judgment and impulse control is okay.  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  We have talked in the past about adding Lamictal and Vistaril but patient always reluctant to add anymore medication.  Today he is agreed to try Lamictal to help his irritability and anger.  I will reduce Prozac 20 mg and continue Lamictal 150 mg daily.  We will start Lamictal 25 mg daily for 1 week and gradually increase to 50 mg.  Discuss in detail medication side effects especially causing rash and itching .  Recommended to stop  Lamictal if it causes rash.  Patient is taking trazodone 200 mg is prescribed by his primary care physician.  We talked about multiple antidepressant however at this time patient does not want to stop trazodone.  Patient is scheduled to see his cardiologist in 2 weeks.  I also recommended to get marriage counseling.  Patient does not want to see Dr. [name] because he feel it does not help him.  I suggested to seek together Dr. [name] if possible.  Recommended to call us back if he has any question or any concern.  I will see him again in 3-4 weeks.  Time spent 25 minutes.  More than 50% of the time spent in psychoeducation, counseling and coordination of care.  Discuss safety plan that anytime having active suicidal thoughts or homicidal thoughts then patient need to call 911 or go to the local emergency room.  Pieper Kasik T., MD 01/17/2014

## 2014-01-25 DIAGNOSIS — M2669 Other specified disorders of temporomandibular joint: Secondary | ICD-10-CM | POA: Diagnosis not present

## 2014-01-30 DIAGNOSIS — M5416 Radiculopathy, lumbar region: Secondary | ICD-10-CM | POA: Diagnosis not present

## 2014-01-31 ENCOUNTER — Ambulatory Visit (HOSPITAL_COMMUNITY): Payer: Self-pay | Admitting: Psychiatry

## 2014-01-31 DIAGNOSIS — I119 Hypertensive heart disease without heart failure: Secondary | ICD-10-CM | POA: Diagnosis not present

## 2014-02-04 ENCOUNTER — Other Ambulatory Visit (HOSPITAL_COMMUNITY): Payer: Self-pay | Admitting: Psychiatry

## 2014-02-05 ENCOUNTER — Other Ambulatory Visit (HOSPITAL_COMMUNITY): Payer: Self-pay | Admitting: Psychiatry

## 2014-02-05 ENCOUNTER — Telehealth (HOSPITAL_COMMUNITY): Payer: Self-pay | Admitting: *Deleted

## 2014-02-05 DIAGNOSIS — F329 Major depressive disorder, single episode, unspecified: Secondary | ICD-10-CM

## 2014-02-05 DIAGNOSIS — F32A Depression, unspecified: Secondary | ICD-10-CM

## 2014-02-05 MED ORDER — LAMOTRIGINE 25 MG PO TABS: ORAL_TABLET | ORAL | Status: DC

## 2014-02-05 NOTE — Telephone Encounter (Signed)
Received refill request for Lamotrigine from pharmacy Was given #30 of 25 mg tablets on 01/17/14 with directions to take ONE daily for week and then increase to TWO daily. Now out of medicine. Called patient: No rash or other signs/symptoms of side effects. Can next RX be for Lamotrigine 50 mg, take one daily?

## 2014-02-07 DIAGNOSIS — N400 Enlarged prostate without lower urinary tract symptoms: Secondary | ICD-10-CM | POA: Diagnosis not present

## 2014-02-20 ENCOUNTER — Ambulatory Visit (INDEPENDENT_AMBULATORY_CARE_PROVIDER_SITE_OTHER): Payer: Medicare Other | Admitting: Psychiatry

## 2014-02-20 ENCOUNTER — Encounter (HOSPITAL_COMMUNITY): Payer: Self-pay | Admitting: Psychiatry

## 2014-02-20 VITALS — BP 125/87 | HR 73 | Ht 69.0 in | Wt 150.2 lb

## 2014-02-20 DIAGNOSIS — F32A Depression, unspecified: Secondary | ICD-10-CM

## 2014-02-20 DIAGNOSIS — F329 Major depressive disorder, single episode, unspecified: Secondary | ICD-10-CM

## 2014-02-20 MED ORDER — LAMOTRIGINE 25 MG PO TABS: ORAL_TABLET | ORAL | Status: DC

## 2014-02-20 MED ORDER — BUPROPION HCL ER (XL) 150 MG PO TB24
ORAL_TABLET | ORAL | Status: DC
Start: 2014-02-20 — End: 2014-04-04

## 2014-02-20 MED ORDER — FLUOXETINE HCL 20 MG PO CAPS: 20.0000 mg | ORAL_CAPSULE | Freq: Every day | ORAL | Status: DC

## 2014-02-20 NOTE — Progress Notes (Signed)
[facility] [phone] Progress Note  Colin Galvan [id number] 68 y.o.  02/20/2014 1:10 PM  Chief Complaint: I am only taking Lamictal 25 mg.  I forgot to take 2 tablet daily.        History of Present Illness:   Colin Galvan came for his followup appointment.  on his last visit he came with his wife who mentioned that patient has been short temper, irritability and anger.  She believed that patient has bipolar disorder however patient does not believe he has any bipolar disorder.  Today patient came by himself.  He admitted that couple has decided to get separated for a while and get some time to each other.  I recommended to try Lamictal 25 mg and gradually increase to 50 mg after 1 week but patient did not took second dose and remains on 25 mg.  Overall he feels fair and improved from the past but he continues to have marital issues and not sure if things will get better.  He is not interested to see Dr. Jefm Galvan.  He sleeping good.  He had a good energy level but sometime he feel hopeless.  He denies any active or passive suicidal thoughts or homicidal thought.  He denies any irritability or any anger.  His wife is seeing Dr. [name] for counseling.  Patient denies any rash or itching.  Recently he seen his cardiologist and not taking his blood pressure medication.  He is not happy that he has to take multiple medication.  He do not recall any short temper but admitted feeling tired which he believes because of cardiac medication.  He denies any crying spells, feelings of hopelessness or worthlessness.  He is concerned about his medical issues which are multiple and chronic.  We have cut down his Prozac from 40 mg to 20 mg and he does not see any worsening of his depression.  Patient denies any hallucination, paranoia, aggression or violence. He is a retired Chief Executive Officer .  His wife is living with him but currently both are separated.  Patient is excited about upcoming ski trip in  January.  Suicidal Ideation: No Plan Formed: No Patient has means to carry out plan: No  Homicidal Ideation: No Plan Formed: No Patient has means to carry out plan: No  Review of Systems  Constitutional: Positive for malaise/fatigue.  Skin: Negative for itching and rash.  Psychiatric/Behavioral: The patient is nervous/anxious.        Irritability   Psychiatric: Agitation: No Hallucination: No Depressed Mood: Yes Insomnia: No Hypersomnia: No Altered Concentration: No Feels Worthless: No Grandiose Ideas: No Belief In Special Powers: No New/Increased Substance Abuse: No Compulsions: No  Neurologic: Headache: No Seizure: No Paresthesias: No  Medical History;  Patient has GERD, gout, history of melanoma and hypertension.  His primary care physician is Dr. [name] who is also a close friend of the patient.  His cardiologist is Raechel Chute in [place].  Outpatient Encounter Prescriptions as of 02/20/2014  Medication Sig  . allopurinol (ZYLOPRIM) 300 MG tablet Take 300 mg by mouth daily.    Marland Kitchen amLODipine (NORVASC) 5 MG tablet   . aspirin 81 MG tablet Take 81 mg by mouth daily.    Marland Kitchen buPROPion (WELLBUTRIN XL) 150 MG 24 hr tablet TAKE ONE TABLET BY MOUTH ONCE DAILY IN THE MORNING  . carvedilol (COREG) 6.25 MG tablet   . docusate sodium (STOOL SOFTENER) 100 MG capsule Take 100 mg by mouth 2 (two) times daily as needed.    Marland Kitchen  FLUoxetine (PROZAC) 20 MG capsule Take 1 capsule (20 mg total) by mouth daily.  . fluticasone (FLONASE) 50 MCG/ACT nasal spray Place 2 sprays into the nose 3 times/day as needed-between meals & bedtime.   . IPRATROPIUM BROMIDE NA Place 2 sprays into the nose daily.  Marland Kitchen lamoTRIgine (LAMICTAL) 25 MG tablet Take 2 tab daily for 1 week and than 3 tab daily.  . meloxicam (MOBIC) 15 MG tablet   . Multiple Vitamin (MULTIVITAMIN) capsule Take 1 capsule by mouth daily.    Marland Kitchen omeprazole (PRILOSEC) 20 MG capsule Take 20 mg by mouth daily.    . ranitidine (ZANTAC) 150  MG tablet Take 150 mg by mouth daily.  . simvastatin (ZOCOR) 20 MG tablet Take 20 mg by mouth at bedtime.    . traZODone (DESYREL) 100 MG tablet Take 200 mg by mouth at bedtime.  . tretinoin (RETIN-A) 0.05 % cream   . triamterene-hydrochlorothiazide (DYAZIDE) 37.5-25 MG per capsule   . [DISCONTINUED] buPROPion (WELLBUTRIN XL) 150 MG 24 hr tablet TAKE ONE TABLET BY MOUTH ONCE DAILY IN THE MORNING  . [DISCONTINUED] FLUoxetine (PROZAC) 20 MG capsule Take 1 capsule (20 mg total) by mouth daily.  . [DISCONTINUED] lamoTRIgine (LAMICTAL) 25 MG tablet Take 2 tab daily.    No results found for this or any previous visit (from the past 72 hour(s)).  Past Psychiatric History/Hospitalization(s)  Patient has history of depression for almost 20 years.  In the past he has tried Cymbalta, Klonopin and Prestiq.  Patient denies any history of mania or psychosis.  He denies any history of psychiatric inpatient treatment or any suicidal attempt. Anxiety: Yes Bipolar Disorder: No Depression: Yes Mania: No Psychosis: No Schizophrenia: No Personality Disorder: No Hospitalization for psychiatric illness: No History of Electroconvulsive Shock Therapy: No Prior Suicide Attempts: No  Physical Exam: Constitutional:  BP 125/87 mmHg  Pulse 73  Ht 5\' 9"  (1.753 m)  Wt 150 lb 3.2 oz (68.13 kg)  BMI 22.17 kg/m2  Musculoskeletal: Strength & Muscle Tone: within normal limits Gait & Station: normal Patient leans: N/A  Mental Status Examination;  Patient is well dressed and well groomed man who appears to be in his stated age.   His his speech is slow, clear and coherent.  His attention and concentration is fair.  He has some difficulty remembering things but he denies any active or passive suicidal thoughts or homicidal thought.  His fund of knowledge is good. His thought process is slow but logical.  There were no paranoia or delusions present at this time.  He described his mood is euthymic and his affect is  mood appropriate.  There were no tremors or shakes.  He is alert and oriented x3.  His insight judgment and impulse control is okay.  Established Problem, Stable/Improving (1), Review of Psycho-Social Stressors (1), Review of Last Therapy Session (1), Review of Medication Regimen & Side Effects (2) and Review of New Medication or Change in Dosage (2)  Assessment: Axis I: Depressive disorder NOS, rule out bipolar disorder depressed type  Axis II: Deferred  Axis III: see medical history  Axis IV: Moderate   Plan:  I reinforced to take Lamictal 50 mg for 1 week and then 75 mg daily.  At this time patient does not have any rash or itching.  He is taking Prozac 20 mg and Wellbutrin 150 mg daily.  He is getting trazodone from his primary care physician for insomnia.  He is not interested to see Dr. Jefm Galvan however  he is willing to see Dr. [name] for marriage counseling if his wife permits him.  His wife is seeing Dr. [name] for counseling.  I had a long discussion with him about his medication side effects and benefits.  We will consider lowering his Prozac further on his next appointment.  Recommended to call us back if he has any question, concern or if he feel worsening of the symptoms.  I do believe patient will get benefit from marriage counseling.  Follow-up in 6 weeks. Time spent 25 minutes.  More than 50% of the time spent in psychoeducation, counseling and coordination of care.  Discuss safety plan that anytime having active suicidal thoughts or homicidal thoughts then patient need to call 911 or go to the local emergency room.  Anola Mcgough T., MD 02/20/2014

## 2014-02-27 DIAGNOSIS — M47816 Spondylosis without myelopathy or radiculopathy, lumbar region: Secondary | ICD-10-CM | POA: Diagnosis not present

## 2014-02-27 DIAGNOSIS — M545 Low back pain: Secondary | ICD-10-CM | POA: Diagnosis not present

## 2014-03-01 DIAGNOSIS — M47816 Spondylosis without myelopathy or radiculopathy, lumbar region: Secondary | ICD-10-CM | POA: Diagnosis not present

## 2014-03-01 DIAGNOSIS — M545 Low back pain: Secondary | ICD-10-CM | POA: Diagnosis not present

## 2014-03-04 DIAGNOSIS — I119 Hypertensive heart disease without heart failure: Secondary | ICD-10-CM | POA: Diagnosis not present

## 2014-03-07 ENCOUNTER — Telehealth (HOSPITAL_COMMUNITY): Payer: Self-pay

## 2014-03-07 ENCOUNTER — Other Ambulatory Visit (HOSPITAL_COMMUNITY): Payer: Self-pay | Admitting: Psychiatry

## 2014-03-07 DIAGNOSIS — M47816 Spondylosis without myelopathy or radiculopathy, lumbar region: Secondary | ICD-10-CM | POA: Diagnosis not present

## 2014-03-07 DIAGNOSIS — M545 Low back pain: Secondary | ICD-10-CM | POA: Diagnosis not present

## 2014-03-07 NOTE — Telephone Encounter (Signed)
I returned phone call.  Spoke to his wife who mentioned that patient having side effects with Lamictal but no rash.  I recommended to decrease Lamictal from 75 mg to 50 mg to see if symptoms resolve.  He has itching with Lamictal.  If symptoms do not resolve recommended to call us back.

## 2014-03-11 DIAGNOSIS — M47816 Spondylosis without myelopathy or radiculopathy, lumbar region: Secondary | ICD-10-CM | POA: Diagnosis not present

## 2014-03-11 DIAGNOSIS — M5416 Radiculopathy, lumbar region: Secondary | ICD-10-CM | POA: Diagnosis not present

## 2014-03-11 DIAGNOSIS — M545 Low back pain: Secondary | ICD-10-CM | POA: Diagnosis not present

## 2014-03-18 DIAGNOSIS — M5416 Radiculopathy, lumbar region: Secondary | ICD-10-CM | POA: Diagnosis not present

## 2014-03-18 DIAGNOSIS — M47816 Spondylosis without myelopathy or radiculopathy, lumbar region: Secondary | ICD-10-CM | POA: Diagnosis not present

## 2014-03-18 DIAGNOSIS — M545 Low back pain: Secondary | ICD-10-CM | POA: Diagnosis not present

## 2014-03-19 ENCOUNTER — Other Ambulatory Visit (HOSPITAL_COMMUNITY): Payer: Self-pay | Admitting: Psychiatry

## 2014-03-19 DIAGNOSIS — M545 Low back pain: Secondary | ICD-10-CM | POA: Diagnosis not present

## 2014-03-19 DIAGNOSIS — M5416 Radiculopathy, lumbar region: Secondary | ICD-10-CM | POA: Diagnosis not present

## 2014-03-19 DIAGNOSIS — M47816 Spondylosis without myelopathy or radiculopathy, lumbar region: Secondary | ICD-10-CM | POA: Diagnosis not present

## 2014-03-23 ENCOUNTER — Other Ambulatory Visit (HOSPITAL_COMMUNITY): Payer: Self-pay | Admitting: Psychiatry

## 2014-03-28 ENCOUNTER — Other Ambulatory Visit (HOSPITAL_COMMUNITY): Payer: Self-pay | Admitting: Psychiatry

## 2014-04-02 DIAGNOSIS — R0981 Nasal congestion: Secondary | ICD-10-CM | POA: Diagnosis not present

## 2014-04-02 DIAGNOSIS — J342 Deviated nasal septum: Secondary | ICD-10-CM | POA: Diagnosis not present

## 2014-04-02 DIAGNOSIS — J343 Hypertrophy of nasal turbinates: Secondary | ICD-10-CM | POA: Diagnosis not present

## 2014-04-03 DIAGNOSIS — R5383 Other fatigue: Secondary | ICD-10-CM | POA: Diagnosis not present

## 2014-04-03 DIAGNOSIS — C4490 Unspecified malignant neoplasm of skin, unspecified: Secondary | ICD-10-CM | POA: Diagnosis not present

## 2014-04-03 DIAGNOSIS — Z23 Encounter for immunization: Secondary | ICD-10-CM | POA: Diagnosis not present

## 2014-04-03 DIAGNOSIS — N200 Calculus of kidney: Secondary | ICD-10-CM | POA: Diagnosis not present

## 2014-04-03 DIAGNOSIS — E559 Vitamin D deficiency, unspecified: Secondary | ICD-10-CM | POA: Diagnosis not present

## 2014-04-03 DIAGNOSIS — E785 Hyperlipidemia, unspecified: Secondary | ICD-10-CM | POA: Diagnosis not present

## 2014-04-03 DIAGNOSIS — R05 Cough: Secondary | ICD-10-CM | POA: Diagnosis not present

## 2014-04-03 DIAGNOSIS — F439 Reaction to severe stress, unspecified: Secondary | ICD-10-CM | POA: Diagnosis not present

## 2014-04-03 DIAGNOSIS — J301 Allergic rhinitis due to pollen: Secondary | ICD-10-CM | POA: Diagnosis not present

## 2014-04-04 ENCOUNTER — Encounter (HOSPITAL_COMMUNITY): Payer: Self-pay | Admitting: Psychiatry

## 2014-04-04 ENCOUNTER — Ambulatory Visit (INDEPENDENT_AMBULATORY_CARE_PROVIDER_SITE_OTHER): Payer: Medicare Other | Admitting: Psychiatry

## 2014-04-04 VITALS — BP 120/78 | HR 67 | Ht 69.0 in | Wt 156.0 lb

## 2014-04-04 DIAGNOSIS — F329 Major depressive disorder, single episode, unspecified: Secondary | ICD-10-CM

## 2014-04-04 DIAGNOSIS — F32A Depression, unspecified: Secondary | ICD-10-CM

## 2014-04-04 MED ORDER — BUPROPION HCL ER (XL) 150 MG PO TB24: ORAL_TABLET | ORAL | Status: DC

## 2014-04-04 MED ORDER — LAMOTRIGINE 100 MG PO TABS: 100.0000 mg | ORAL_TABLET | Freq: Every day | ORAL | Status: DC

## 2014-04-04 MED ORDER — TRAZODONE HCL 100 MG PO TABS: 200.0000 mg | ORAL_TABLET | Freq: Every day | ORAL | Status: DC

## 2014-04-04 NOTE — Progress Notes (Signed)
[facility] [id number] Progress Note  Colin Galvan [id number] 69 y.o.  04/04/2014 10:36 AM  Chief Complaint: I like Lamictal.  I'm taking 75 mg.  I can go up on medication.         History of Present Illness:   Colin Galvan came for his followup appointment.  He is taking Lamictal 75 mg.  He has called [place] because he was concerned about itching and we have recommended to decrease the dose however patient did not decrease the dose and he felt it was helping him.  His itching is resolved.  He has no rash.  He continues to have irritability and anger moments but overall his mood has been stable.  He sleeping good.  He had decided not to see Dr. [name] however he wants to keep the option to see Dr. [name] oh if needed in the future.  Overall he feels his medicine is life is going very well.  On holidays he went to [place] and spent time with the kids.  Patient told he had a very good time.  Patient denies any paranoia, hallucination, any obsessive thoughts.  He still have mood irritability and anger but it is less intense and less frequent from the past.  His wife continues to see Dr. [name] .  Patient usually goes with her for marriage counseling and he mentioned it is helping him.  He is no longer taking Prozac.  He has no tremors, shakes or any EPS.  Patient lives with his wife.  His appetite is okay.  His vitals are stable.  Suicidal Ideation: No Plan Formed: No Patient has means to carry out plan: No  Homicidal Ideation: No Plan Formed: No Patient has means to carry out plan: No  Review of Systems  Constitutional: Negative for weight loss and malaise/fatigue.  Skin: Negative for itching and rash.  Psychiatric/Behavioral:       Irritability   Psychiatric: Agitation: No Hallucination: No Depressed Mood: No Insomnia: No Hypersomnia: No Altered Concentration: No Feels Worthless: No Grandiose Ideas: No Belief In Special Powers: No New/Increased Substance Abuse:  No Compulsions: No  Neurologic: Headache: No Seizure: No Paresthesias: No  Medical History;  Patient has GERD, gout, history of melanoma and hypertension.  His primary care physician is Dr. [name] who is also a close friend of the patient.  His cardiologist is Raechel Chute in [place].  Outpatient Encounter Prescriptions as of 04/04/2014  Medication Sig  . allopurinol (ZYLOPRIM) 300 MG tablet Take 300 mg by mouth daily.    Marland Kitchen amLODipine (NORVASC) 5 MG tablet   . aspirin 81 MG tablet Take 81 mg by mouth daily.    Marland Kitchen buPROPion (WELLBUTRIN XL) 150 MG 24 hr tablet TAKE ONE TABLET BY MOUTH ONCE DAILY IN THE MORNING  . carvedilol (COREG) 6.25 MG tablet   . docusate sodium (STOOL SOFTENER) 100 MG capsule Take 100 mg by mouth 2 (two) times daily as needed.    . fluticasone (FLONASE) 50 MCG/ACT nasal spray Place 2 sprays into the nose 3 times/day as needed-between meals & bedtime.   . IPRATROPIUM BROMIDE NA Place 2 sprays into the nose daily.  Marland Kitchen lamoTRIgine (LAMICTAL) 100 MG tablet Take 1 tablet (100 mg total) by mouth daily.  . meloxicam (MOBIC) 15 MG tablet   . Multiple Vitamin (MULTIVITAMIN) capsule Take 1 capsule by mouth daily.    Marland Kitchen omeprazole (PRILOSEC) 20 MG capsule Take 20 mg by mouth daily.    . ranitidine (ZANTAC) 150  MG tablet Take 150 mg by mouth daily.  . simvastatin (ZOCOR) 20 MG tablet Take 20 mg by mouth at bedtime.    . traZODone (DESYREL) 100 MG tablet Take 2 tablets (200 mg total) by mouth at bedtime.  . tretinoin (RETIN-A) 0.05 % cream   . triamterene-hydrochlorothiazide (DYAZIDE) 37.5-25 MG per capsule   . [DISCONTINUED] buPROPion (WELLBUTRIN XL) 150 MG 24 hr tablet TAKE ONE TABLET BY MOUTH ONCE DAILY IN THE MORNING  . [DISCONTINUED] FLUoxetine (PROZAC) 20 MG capsule TAKE ONE CAPSULE BY MOUTH ONCE DAILY  . [DISCONTINUED] lamoTRIgine (LAMICTAL) 25 MG tablet Take 2 tab daily for 1 week and than 3 tab daily.  . [DISCONTINUED] traZODone (DESYREL) 100 MG tablet Take 200  mg by mouth at bedtime.    No results found for this or any previous visit (from the past 72 hour(s)).  Past Psychiatric History/Hospitalization(s)  Patient has history of depression for almost 20 years.  In the past he has tried Cymbalta, Klonopin and Prestiq.  Patient denies any history of mania or psychosis.  He denies any history of psychiatric inpatient treatment or any suicidal attempt. Anxiety: Yes Bipolar Disorder: No Depression: Yes Mania: No Psychosis: No Schizophrenia: No Personality Disorder: No Hospitalization for psychiatric illness: No History of Electroconvulsive Shock Therapy: No Prior Suicide Attempts: No  Physical Exam: Constitutional:  BP 120/78 mmHg  Pulse 67  Ht 5\' 9"  (1.753 m)  Wt 156 lb (70.761 kg)  BMI 23.03 kg/m2  Musculoskeletal: Strength & Muscle Tone: within normal limits Gait & Station: normal Patient leans: N/A  Mental Status Examination;  Patient is well dressed and well groomed man.  He is pleasant and cooperative.  His speech is slow, clear and coherent.  His attention and concentration is fair.  He has some difficulty remembering things but he denies any active or passive suicidal thoughts or homicidal thought.  His fund of knowledge is good. His thought process is slow but logical.  There were no paranoia or delusions present at this time.  He described his mood is euthymic and his affect is mood appropriate.  There were no tremors or shakes.  He is alert and oriented x3.  His insight judgment and impulse control is okay.  Established Problem, Stable/Improving (1), Review of Psycho-Social Stressors (1), Review and summation of old records (2), Review of Last Therapy Session (1), Review of Medication Regimen & Side Effects (2) and Review of New Medication or Change in Dosage (2)  Assessment: Axis I: Depressive disorder NOS, rule out bipolar disorder depressed type  Axis II: Deferred  Axis III: see medical history  Axis IV:  Moderate   Plan:  I would increase Lamictal 100 mg daily however remind him to call us back if he has any rash or any side effects.  I will discontinue Prozac since he is not taking and continue Wellbutrin XL 150 mg daily.  Discussed medication side effects and benefits.  Encouraged to keep appointment with Dr. [name] if needed.  Patient acknowledged.  I will see him again in 3 months. Time spent 25 minutes.  More than 50% of the time spent in psychoeducation, counseling and coordination of care.  Discuss safety plan that anytime having active suicidal thoughts or homicidal thoughts then patient need to call 911 or go to the local emergency room.  ARFEEN,SYED T., MD 04/04/2014

## 2014-04-08 DIAGNOSIS — R5383 Other fatigue: Secondary | ICD-10-CM | POA: Diagnosis not present

## 2014-04-08 DIAGNOSIS — E559 Vitamin D deficiency, unspecified: Secondary | ICD-10-CM | POA: Diagnosis not present

## 2014-04-08 DIAGNOSIS — E785 Hyperlipidemia, unspecified: Secondary | ICD-10-CM | POA: Diagnosis not present

## 2014-04-09 ENCOUNTER — Ambulatory Visit (INDEPENDENT_AMBULATORY_CARE_PROVIDER_SITE_OTHER): Payer: Medicare Other | Admitting: Psychology

## 2014-04-09 DIAGNOSIS — F329 Major depressive disorder, single episode, unspecified: Secondary | ICD-10-CM

## 2014-04-09 DIAGNOSIS — F32A Depression, unspecified: Secondary | ICD-10-CM

## 2014-04-10 ENCOUNTER — Encounter (HOSPITAL_COMMUNITY): Payer: Self-pay | Admitting: Psychology

## 2014-04-10 DIAGNOSIS — M7712 Lateral epicondylitis, left elbow: Secondary | ICD-10-CM | POA: Diagnosis not present

## 2014-04-10 DIAGNOSIS — M47812 Spondylosis without myelopathy or radiculopathy, cervical region: Secondary | ICD-10-CM | POA: Diagnosis not present

## 2014-04-10 DIAGNOSIS — M545 Low back pain: Secondary | ICD-10-CM | POA: Diagnosis not present

## 2014-04-10 DIAGNOSIS — M5416 Radiculopathy, lumbar region: Secondary | ICD-10-CM | POA: Diagnosis not present

## 2014-04-10 NOTE — Progress Notes (Signed)
   THERAPIST PROGRESS NOTE  Session Time: 9 AM  Participation Level: Active  Behavioral Response: Well GroomedAlertDepressed  Type of Therapy: Individual Therapy  Treatment Goals addressed: Coping  Interventions: CBT  Summary: Colin Galvan. is a 69 y.o. male who presents with issues related to struggles between he and his wife and stress associated with recurrent depression.   Suicidal/Homicidal: Negativewithout intent/plan  Therapist Response: The patient has continued to struggle with issues related to his marriage and the conflicts that regularly spur between the patient and his wife. The patient tends to avoid conflicts when they arise in the patient's wife becomes very upset when these are not addressed. We worked on Radiographer, therapeutic and strategies around these issues.  Plan: Return again in 4 weeks weeks.  Diagnosis: Axis I: Depressive Disorder NOS    Axis II: No diagnosis    RODENBOUGH,JOHN R, PsyD 04/10/2014

## 2014-04-19 DIAGNOSIS — M2669 Other specified disorders of temporomandibular joint: Secondary | ICD-10-CM | POA: Diagnosis not present

## 2014-04-22 ENCOUNTER — Telehealth (HOSPITAL_COMMUNITY): Payer: Self-pay | Admitting: Psychiatry

## 2014-04-22 ENCOUNTER — Telehealth (HOSPITAL_COMMUNITY): Payer: Self-pay | Admitting: *Deleted

## 2014-04-22 NOTE — Telephone Encounter (Signed)
I returned patient's phone call.  He noticed increased irritability and anger since Lamictal increase.  I recommended to decrease Lamictal and take only 50 mg to see how it works.  I recommended to call us back if symptoms do not improve.

## 2014-04-22 NOTE — Telephone Encounter (Signed)
Telephone message left for patient attempting to reach him regarding follow up on Lamictal.  Requested patient call back this date to discuss and to find out exactly what are the concerns.  Question if experiencing any type of rash and if needs to stop?  Will wait for call back from patient.

## 2014-04-22 NOTE — Telephone Encounter (Signed)
Patient would like a call back from the nurse. Having problems with Lamicatal.

## 2014-04-22 NOTE — Telephone Encounter (Signed)
Telephone call with patient who reported since increase in Lamictal on 04/04/14, last week he noticed he has felt more anxious, jittery and irritable.  Denies any suicidal or homicidal ideations and questions would a medication adjustment be in order to deal with current "dramatic change in symptoms" per patient words.  Requests call back for follow up and also reported increase in Simvastatin by primary care provider last week from 20mg  qday to 40mg  q day.   Next evaluation set for 07/04/14 and also seeing Dr. Sima Matas for therapy again on 05/02/14.

## 2014-04-24 DIAGNOSIS — H04123 Dry eye syndrome of bilateral lacrimal glands: Secondary | ICD-10-CM | POA: Diagnosis not present

## 2014-04-24 DIAGNOSIS — H2513 Age-related nuclear cataract, bilateral: Secondary | ICD-10-CM | POA: Diagnosis not present

## 2014-05-02 ENCOUNTER — Ambulatory Visit (INDEPENDENT_AMBULATORY_CARE_PROVIDER_SITE_OTHER): Payer: Medicare Other | Admitting: Psychology

## 2014-05-02 DIAGNOSIS — F329 Major depressive disorder, single episode, unspecified: Secondary | ICD-10-CM | POA: Diagnosis not present

## 2014-05-02 DIAGNOSIS — F32A Depression, unspecified: Secondary | ICD-10-CM

## 2014-05-09 ENCOUNTER — Encounter (INDEPENDENT_AMBULATORY_CARE_PROVIDER_SITE_OTHER): Payer: Self-pay

## 2014-05-09 ENCOUNTER — Ambulatory Visit (INDEPENDENT_AMBULATORY_CARE_PROVIDER_SITE_OTHER): Payer: Medicare Other | Admitting: Family Medicine

## 2014-05-09 ENCOUNTER — Encounter: Payer: Self-pay | Admitting: Family Medicine

## 2014-05-09 VITALS — BP 127/84 | HR 56 | Ht 68.0 in | Wt 148.0 lb

## 2014-05-09 DIAGNOSIS — S56912A Strain of unspecified muscles, fascia and tendons at forearm level, left arm, initial encounter: Secondary | ICD-10-CM | POA: Diagnosis not present

## 2014-05-09 DIAGNOSIS — M25511 Pain in right shoulder: Secondary | ICD-10-CM

## 2014-05-09 MED ORDER — NITROGLYCERIN 0.2 MG/HR TD PT24: MEDICATED_PATCH | TRANSDERMAL | Status: DC

## 2014-05-09 NOTE — Patient Instructions (Signed)
You have a proximal forearm strain from overuse - I would treat this very similarly to lateral epicondylitis (though injection unlikely to be helpful as it would be with lateral epicondylitis). Try to avoid painful activities as much as possible. Ice the area 3-4 times a day for 15 minutes at a time as needed. Tylenol or aleve as needed for pain. Counterforce brace as directed can help unload area - wear this regularly if it provides you with relief. Home Pronation/supination with a hammer, wrist extension with a 1 pound weight 3 sets of 10 once a day.  Stretching - hold for 20-30 seconds, repeat at least 3 times once or twice a day. Consider formal physical therapy. Nitro patches - 1/4th patch over affected area, change daily in a little different spot. Follow up with me in 6 weeks.  You have rotator cuff impingement of your right shoulder. Try to avoid painful activities (overhead activities, lifting with extended arm) as much as possible. Aleve 2 tabs twice a day with food OR ibuprofen 3 tabs three times a day with food for pain and inflammation as needed. Can take tylenol in addition to this. Subacromial injection may be beneficial to help with pain and to decrease inflammation - can consider repeating this in the future. Consider physical therapy with transition to home exercise program. Do home exercise program with theraband and scapular stabilization exercises daily - these are very important for long term relief even if an injection was given.  3 sets of 10 once a day only. In a couple weeks if you're tolerating the nitro patches for your forearm you can use them the same way for this shoulder.

## 2014-05-10 DIAGNOSIS — D485 Neoplasm of uncertain behavior of skin: Secondary | ICD-10-CM | POA: Diagnosis not present

## 2014-05-10 DIAGNOSIS — S56912A Strain of unspecified muscles, fascia and tendons at forearm level, left arm, initial encounter: Secondary | ICD-10-CM | POA: Insufficient documentation

## 2014-05-10 DIAGNOSIS — C434 Malignant melanoma of scalp and neck: Secondary | ICD-10-CM | POA: Diagnosis not present

## 2014-05-10 DIAGNOSIS — M25511 Pain in right shoulder: Secondary | ICD-10-CM | POA: Insufficient documentation

## 2014-05-10 DIAGNOSIS — Z85828 Personal history of other malignant neoplasm of skin: Secondary | ICD-10-CM | POA: Diagnosis not present

## 2014-05-10 DIAGNOSIS — L820 Inflamed seborrheic keratosis: Secondary | ICD-10-CM | POA: Diagnosis not present

## 2014-05-10 DIAGNOSIS — L570 Actinic keratosis: Secondary | ICD-10-CM | POA: Diagnosis not present

## 2014-05-10 NOTE — Progress Notes (Signed)
PCP: Lucretia Kern., DO  Subjective:   HPI: Patient is a 69 y.o. male here for right shoulder, left forearm pain.  Patient reports pain in forearm started last year after hitting too many tennis balls (about 150) one day. Pain in on dorsal aspect of forearm. Has done a prednisone dose pack, dry needling, used a counterforce band, done some stretching. Did not do formal physical therapy for this. Pain in right shoulder started around September with repetitive motions in tennis. Is left handed. Tried ibuprofen 800mg  with minimal benefit. Tried voltaren gel for his shoulder with short term relief.  Past Medical History  Diagnosis Date  . Fatigue   . Vitamin D deficiency   . Allergic rhinitis   . Hx of colonic polyps   . Depression   . Other diseases of lung, not elsewhere classified   . Gout   . Nephrolithiasis   . Hypertension   . Hyperlipidemia   . Family hx of melanoma   . Family hx of colon cancer     Current Outpatient Prescriptions on File Prior to Visit  Medication Sig Dispense Refill  . allopurinol (ZYLOPRIM) 300 MG tablet Take 300 mg by mouth daily.      Marland Kitchen amLODipine (NORVASC) 5 MG tablet     . aspirin 81 MG tablet Take 81 mg by mouth daily.      Marland Kitchen buPROPion (WELLBUTRIN XL) 150 MG 24 hr tablet TAKE ONE TABLET BY MOUTH ONCE DAILY IN THE MORNING 30 tablet 2  . IPRATROPIUM BROMIDE NA Place 2 sprays into the nose daily.    Marland Kitchen lamoTRIgine (LAMICTAL) 100 MG tablet Take 1 tablet (100 mg total) by mouth daily. 30 tablet 2  . traZODone (DESYREL) 100 MG tablet Take 2 tablets (200 mg total) by mouth at bedtime. 60 tablet 2   No current facility-administered medications on file prior to visit.    No past surgical history on file.  Allergies  Allergen Reactions  . Tetracycline Hcl     History   Social History  . Marital Status: Married    Spouse Name: N/A  . Number of Children: N/A  . Years of Education: N/A   Occupational History  . RETIRED    Social History  Main Topics  . Smoking status: Former Smoker    Quit date: 03/22/1980  . Smokeless tobacco: Not on file  . Alcohol Use: 0.0 oz/week    0 Standard drinks or equivalent per week     Comment: DAILY USE  . Drug Use: No  . Sexual Activity: Not on file   Other Topics Concern  . Not on file   Social History Narrative    No family history on file.  BP 127/84 mmHg  Pulse 56  Ht 5\' 8"  (1.727 m)  Wt 148 lb (67.132 kg)  BMI 22.51 kg/m2  Review of Systems: See HPI above.    Objective:  Physical Exam:  Gen: NAD  Right shoulder: No swelling, ecchymoses.  No gross deformity. No TTP. FROM with painful arc. Positive Neers, negative hawkins. Negative Speeds, Yergasons. Strength 5/5 with empty can and resisted internal/external rotation. Mild pain with o'briens. Negative apprehension. NV intact distally.  Left elbow/forearm: No gross deformity, swelling, bruising. Tenderness within extensor musculature of forearm proximally but not at lateral epicondyle.   FROM elbow without pain. Pain with wrist extension, supination but not with finger extension, elbow motions. Collateral ligaments intact. NVI distally.    Assessment & Plan:  1. Left forearm strain -  treat similar to lateral epicondylitis.  Using a counterforce brace which has helped.  Has not done home exercises/strengthening so this was reviewed today.  Will trial nitro patches as well - discussed risks of headaches, skin irritation.  Icing, tylenol or aleve if needed.  Consider physical therapy if not improving.  F/u in 6 weeks.  2. Right shoulder pain - 2/2 rotator cuff impingement.  Shown home exercise program for this.  NSAIDs as needed.  Consider injection (reports he had one of these in [place] with mild benefit but pain recurred).  Could add nitro patches for this too but advised to try them for his forearm first to make sure he tolerates them.  Consider physical therapy if not improving.  F/u in 6 weeks.

## 2014-05-10 NOTE — Assessment & Plan Note (Signed)
treat similar to lateral epicondylitis.  Using a counterforce brace which has helped.  Has not done home exercises/strengthening so this was reviewed today.  Will trial nitro patches as well - discussed risks of headaches, skin irritation.  Icing, tylenol or aleve if needed.  Consider physical therapy if not improving.  F/u in 6 weeks.

## 2014-05-10 NOTE — Assessment & Plan Note (Signed)
2/2 rotator cuff impingement.  Shown home exercise program for this.  NSAIDs as needed.  Consider injection (reports he had one of these in [place] with mild benefit but pain recurred).  Could add nitro patches for this too but advised to try them for his forearm first to make sure he tolerates them.  Consider physical therapy if not improving.  F/u in 6 weeks.

## 2014-05-14 ENCOUNTER — Other Ambulatory Visit (HOSPITAL_COMMUNITY): Payer: Self-pay | Admitting: Psychiatry

## 2014-05-28 ENCOUNTER — Ambulatory Visit (HOSPITAL_COMMUNITY): Payer: Self-pay | Admitting: Psychology

## 2014-06-07 DIAGNOSIS — C434 Malignant melanoma of scalp and neck: Secondary | ICD-10-CM | POA: Diagnosis not present

## 2014-06-07 DIAGNOSIS — L905 Scar conditions and fibrosis of skin: Secondary | ICD-10-CM | POA: Diagnosis not present

## 2014-06-07 DIAGNOSIS — Z85820 Personal history of malignant melanoma of skin: Secondary | ICD-10-CM | POA: Insufficient documentation

## 2014-06-11 DIAGNOSIS — M19072 Primary osteoarthritis, left ankle and foot: Secondary | ICD-10-CM | POA: Diagnosis not present

## 2014-06-11 DIAGNOSIS — D3613 Benign neoplasm of peripheral nerves and autonomic nervous system of lower limb, including hip: Secondary | ICD-10-CM | POA: Diagnosis not present

## 2014-06-11 DIAGNOSIS — M79672 Pain in left foot: Secondary | ICD-10-CM | POA: Diagnosis not present

## 2014-06-11 DIAGNOSIS — M2010 Hallux valgus (acquired), unspecified foot: Secondary | ICD-10-CM | POA: Diagnosis not present

## 2014-06-12 DIAGNOSIS — E782 Mixed hyperlipidemia: Secondary | ICD-10-CM | POA: Diagnosis not present

## 2014-06-12 DIAGNOSIS — I119 Hypertensive heart disease without heart failure: Secondary | ICD-10-CM | POA: Diagnosis not present

## 2014-06-17 ENCOUNTER — Other Ambulatory Visit (HOSPITAL_COMMUNITY): Payer: Self-pay | Admitting: Psychiatry

## 2014-06-17 NOTE — Telephone Encounter (Signed)
Refill of Lamictal requested to soon as patient returns to see Dr. Adele Schilder on 07/04/14.

## 2014-06-20 ENCOUNTER — Ambulatory Visit: Payer: Self-pay | Admitting: Family Medicine

## 2014-06-24 ENCOUNTER — Ambulatory Visit: Payer: Self-pay | Admitting: Neurology

## 2014-06-24 DIAGNOSIS — L820 Inflamed seborrheic keratosis: Secondary | ICD-10-CM | POA: Diagnosis not present

## 2014-06-25 ENCOUNTER — Ambulatory Visit (INDEPENDENT_AMBULATORY_CARE_PROVIDER_SITE_OTHER): Payer: Medicare Other | Admitting: Neurology

## 2014-06-25 ENCOUNTER — Encounter: Payer: Self-pay | Admitting: Neurology

## 2014-06-25 VITALS — BP 116/60 | HR 71 | Ht 69.0 in | Wt 155.1 lb

## 2014-06-25 DIAGNOSIS — R202 Paresthesia of skin: Secondary | ICD-10-CM

## 2014-06-25 DIAGNOSIS — R209 Unspecified disturbances of skin sensation: Secondary | ICD-10-CM | POA: Diagnosis not present

## 2014-06-25 LAB — VITAMIN B12: Vitamin B-12: 879 pg/mL (ref 211–911)

## 2014-06-25 LAB — TSH: TSH: 1.728 u[IU]/mL (ref 0.350–4.500)

## 2014-06-25 LAB — FERRITIN: Ferritin: 39 ng/mL (ref 22–322)

## 2014-06-25 NOTE — Progress Notes (Signed)
[facility] Clinic Note - Initial Visit   Date: 06/25/2014   Spero Geralds. MRN: [id number] DOB: 18-May-1945   Dear Dr. Farris Has:   Thank you for your kind referral of Einar Nolasco. for consultation of hand and foot paresthesias. Although his history is well known to you, please allow [place] to reiterate it for the purpose of our medical record. The patient was accompanied to the clinic by self.    History of Present Illness: Larnell Granlund. is a 69 y.o. left-handed Caucasian male with hyperlipidemia, gout, depression, hypertension, GERD presenting for evaluation of paresthesias of the hands and feet.    Starting in November 2015, he developed tingling/burning over the lateral and dorsum of the right foot.  Symptoms were insidious and intermittent.  It occurs at nighttime when he is resting.  He does not have these symptoms when active, standing, or walking. He denies the urge to move the legs.   Over the past few months, he noticing numbness involving the finger tips (three middle fingers) on both side, worse on the right. It lasts a few minutes and spontaneously resolves.  No associated neck pain or weakness.  Hand symptoms are most bothersome in the morning.  He does not wake up at night or sleep within his wrist flexed.  Denies any neck or back pain.  Previous diabetic screening has been negative.   Past Medical History  Diagnosis Date  . Fatigue   . Vitamin D deficiency   . Allergic rhinitis   . Hx of colonic polyps   . Depression   . Other diseases of lung, not elsewhere classified   . Gout   . Nephrolithiasis   . Hypertension   . Hyperlipidemia   . Family hx of melanoma   . Family hx of colon cancer     Past Surgical History  Procedure Laterality Date  . Skin cancer excision       Medications:  Current Outpatient Prescriptions on File Prior to Visit  Medication Sig Dispense Refill  . allopurinol (ZYLOPRIM) 300 MG tablet  Take 300 mg by mouth daily.      Marland Kitchen amLODipine (NORVASC) 5 MG tablet     . aspirin 81 MG tablet Take 81 mg by mouth daily.      Marland Kitchen buPROPion (WELLBUTRIN XL) 150 MG 24 hr tablet TAKE ONE TABLET BY MOUTH ONCE DAILY IN THE MORNING 30 tablet 2  . IPRATROPIUM BROMIDE NA Place 2 sprays into the nose daily.    Marland Kitchen lamoTRIgine (LAMICTAL) 100 MG tablet Take 1 tablet (100 mg total) by mouth daily. 30 tablet 2  . losartan (COZAAR) 100 MG tablet Take 100 mg by mouth daily.    . nitroGLYCERIN (MINITRAN) 0.2 mg/hr patch Apply 1/4th patch to affected area, change daily 30 patch 1  . simvastatin (ZOCOR) 40 MG tablet Take 40 mg by mouth daily.    . traZODone (DESYREL) 100 MG tablet Take 2 tablets (200 mg total) by mouth at bedtime. 60 tablet 2   No current facility-administered medications on file prior to visit.    Allergies:  Allergies  Allergen Reactions  . Tetracycline Hcl     Family History: Family History  Problem Relation Age of Onset  . Melanoma Mother     Deceased, 32  . Hypertension Mother   . Colon cancer Father     Deceased, 90  . Stroke Father   . Diabetes Mellitus II Brother   . Stroke  Brother     Deceased, 7  . Depression Son     Social History: History   Social History  . Marital Status: Married    Spouse Name: N/A  . Number of Children: N/A  . Years of Education: N/A   Occupational History  . RETIRED    Social History Main Topics  . Smoking status: Former Smoker    Quit date: 03/22/1980  . Smokeless tobacco: Not on file  . Alcohol Use: 0.0 oz/week    0 Standard drinks or equivalent per week     Comment: 1 beer/mixed drink daily  . Drug Use: No  . Sexual Activity: Not on file   Other Topics Concern  . Not on file   Social History Narrative   Lives with wife.    Retired Forensic psychologist, Chief Strategy Officer.   Highest level of education:  JD    Review of Systems:  CONSTITUTIONAL: No fevers, chills, night sweats, or weight loss.   EYES: No visual changes or eye  pain ENT: No hearing changes.  No history of nose bleeds.   RESPIRATORY: No cough, wheezing and shortness of breath.   CARDIOVASCULAR: Negative for chest pain, and palpitations.   GI: Negative for abdominal discomfort, blood in stools or black stools.  No recent change in bowel habits.   GU:  No history of incontinence.   MUSCLOSKELETAL: No history of joint pain or swelling.  No myalgias.   SKIN: Negative for lesions, rash, and itching.   HEMATOLOGY/ONCOLOGY: Negative for prolonged bleeding, bruising easily, and swollen nodes.  ENDOCRINE: Negative for cold or heat intolerance, polydipsia or goiter.   PSYCH:  No depression or anxiety symptoms.   NEURO: As Above.   Vital Signs:  BP 116/60 mmHg  Pulse 71  Ht 5\' 9"  (1.753 m)  Wt 155 lb 1.6 oz (70.353 kg)  BMI 22.89 kg/m2  SpO2 99% Pain Scale: 0 on a scale of 0-10   General Medical Exam:   General:  Well appearing, younger than stated age, comfortable.   Eyes/ENT: see cranial nerve examination.   Neck: No masses appreciated.  Full range of motion without tenderness.  No carotid bruits. Respiratory:  Clear to auscultation, good air entry bilaterally.   Cardiac:  Regular rate and rhythm, no murmur.   Extremities:  No deformities, edema, or skin discoloration.  Skin:  No rashes or lesions.  Neurological Exam: MENTAL STATUS including orientation to time, place, person, recent and remote memory, attention span and concentration, language, and fund of knowledge is normal.  Speech is not dysarthric.  CRANIAL NERVES: II:  No visual field defects.  Unremarkable fundi.   III-IV-VI: Pupils equal round and reactive to light.  Normal conjugate, extra-ocular eye movements in all directions of gaze.  No nystagmus.  No ptosis.   V:  Normal facial sensation.     VII:  Normal facial symmetry and movements.  No pathologic facial reflexes.  VIII:  Normal hearing and vestibular function.   IX-X:  Normal palatal movement.   XI:  Normal shoulder shrug  and head rotation.   XII:  Normal tongue strength and range of motion, no deviation or fasciculation.  MOTOR:  No atrophy, fasciculations or abnormal movements.  No pronator drift.  Tone is normal.    Right Upper Extremity:    Left Upper Extremity:    Deltoid  5/5   Deltoid  5/5   Biceps  5/5   Biceps  5/5   Triceps  5/5   Triceps  5/5   Wrist extensors  5/5   Wrist extensors  5/5   Wrist flexors  5/5   Wrist flexors  5/5   Finger extensors  5/5   Finger extensors  5/5   Finger flexors  5/5   Finger flexors  5/5   Dorsal interossei  5/5   Dorsal interossei  5/5   Abductor pollicis  5/5   Abductor pollicis  5/5   Tone (Ashworth scale)  0  Tone (Ashworth scale)  0   Right Lower Extremity:    Left Lower Extremity:    Hip flexors  5/5   Hip flexors  5/5   Hip extensors  5/5   Hip extensors  5/5   Knee flexors  5/5   Knee flexors  5/5   Knee extensors  5/5   Knee extensors  5/5   Dorsiflexors  5/5   Dorsiflexors  5/5   Plantarflexors  5/5   Plantarflexors  5/5   Toe extensors  5/5   Toe extensors  5/5   Toe flexors  5/5   Toe flexors  5/5   Tone (Ashworth scale)  0  Tone (Ashworth scale)  0   MSRs:  Right                                                                 Left brachioradialis 2+  brachioradialis 2+  biceps 2+  biceps 2+  triceps 2+  triceps 2+  patellar 2+  patellar 2+  ankle jerk 2+  ankle jerk 2+  Hoffman no  Hoffman no  plantar response down  plantar response down   SENSORY:  Normal and symmetric perception of light touch, pinprick, vibration, and proprioception.  Romberg's sign absent.  Tinel's sign at the wrist is negative.  COORDINATION/GAIT: Normal finger-to- nose-finger and heel-to-shin.  Intact rapid alternating movements bilaterally.  Able to rise from a chair without using arms.  Gait narrow based and stable. Tandem and stressed gait intact.    IMPRESSION: Mr. Olivar is a delightful 69 year-old gentleman presenting for evaluation of distal fingertip and  lateral foot paresthesias.  His exam is entirely normal and non-focal.  We discussed the possibilities to explain his intermittent paresthesias including C7 radiculopathy, L5-S1 radiculopathy, or carpal tunnel syndrome.  There is not enough evidence of his exam to suggest he has neuropathy.  I offered to schedule NCS/EMG of the right arm and leg to better characterize the nature of his symptoms, patient since it is nothing bothersome enough, patient deferred testing at this time.  In the meantime, I will screen him for causes of paresthesias including vitamin B12, TSH, copper, zinc, SPEP/UPEP with IFE, and ferritin.    If his symptoms worsen or evolve in an atypical manner, I would have low threshold to obtain electrodiagnostic testing (right arm and leg).    We will call him with the results of the testing and patient will return to clinic as needed.   The duration of this appointment visit was 40 minutes of face-to-face time with the patient.  Greater than 50% of this time was spent in counseling, explanation of diagnosis, planning of further management, and coordination of care.   Thank you for allowing me to participate in patient's care.  If I  can answer any additional questions, I would be pleased to do so.    Sincerely,    Donika K. Posey Pronto, DO

## 2014-06-25 NOTE — Patient Instructions (Signed)
1. Check blood work 2. If your symptoms worsen, please call my office so we can reassess or move forward with the EMG

## 2014-06-25 NOTE — Progress Notes (Signed)
forwarded

## 2014-06-26 ENCOUNTER — Ambulatory Visit (INDEPENDENT_AMBULATORY_CARE_PROVIDER_SITE_OTHER): Payer: Medicare Other | Admitting: Family Medicine

## 2014-06-26 ENCOUNTER — Encounter: Payer: Self-pay | Admitting: Family Medicine

## 2014-06-26 VITALS — BP 101/67 | HR 72 | Ht 69.0 in | Wt 150.0 lb

## 2014-06-26 DIAGNOSIS — S56912A Strain of unspecified muscles, fascia and tendons at forearm level, left arm, initial encounter: Secondary | ICD-10-CM

## 2014-06-26 DIAGNOSIS — M25511 Pain in right shoulder: Secondary | ICD-10-CM

## 2014-06-26 MED ORDER — METHYLPREDNISOLONE ACETATE 40 MG/ML IJ SUSP
40.0000 mg | Freq: Once | INTRAMUSCULAR | Status: AC
  Administered 2014-06-26: 40 mg via INTRA_ARTICULAR

## 2014-06-26 NOTE — Patient Instructions (Signed)
You have a proximal forearm strain from overuse - I would treat this very similarly to lateral epicondylitis. Try to avoid painful activities as much as possible. Ice the area 3-4 times a day for 15 minutes at a time as needed. Tylenol or aleve as needed for pain. Counterforce brace as directed can help unload area - wear this regularly if it provides you with relief. Home Pronation/supination with a hammer, wrist extension with a 1 pound weight 3 sets of 10 once a day.  Stretching - hold for 20-30 seconds, repeat at least 3 times once or twice a day. Consider formal physical therapy. We tried a cortisone shot today. Continue the nitro patches - you can consider going up to 1/2 a patch to this area and your shoulder.  You have rotator cuff impingement of your right shoulder. Try to avoid painful activities (overhead activities, lifting with extended arm) as much as possible. Aleve 2 tabs twice a day with food OR ibuprofen 3 tabs three times a day with food for pain and inflammation as needed. Can take tylenol in addition to this. Subacromial injection may be beneficial to help with pain and to decrease inflammation - you were given this today. Consider physical therapy with transition to home exercise program. Do home exercise program with theraband and scapular stabilization exercises daily - these are very important for long term relief even if an injection was given.  3 sets of 10 once a day only. Follow up with me in 6 weeks.

## 2014-06-27 LAB — UIFE/LIGHT CHAINS/TP QN, 24-HR UR
ALBUMIN, U: DETECTED
ALPHA 2 UR: DETECTED — AB
Alpha 1, Urine: DETECTED — AB
BETA UR: DETECTED — AB
GAMMA UR: DETECTED — AB
Total Protein, Urine: 7 mg/dL (ref 5–25)

## 2014-06-27 LAB — PROTEIN ELECTROPHORESIS, SERUM
ALPHA-1-GLOBULIN: 0.3 g/dL (ref 0.2–0.3)
ALPHA-2-GLOBULIN: 0.5 g/dL (ref 0.5–0.9)
Albumin ELP: 4.1 g/dL (ref 3.8–4.8)
Beta 2: 0.3 g/dL (ref 0.2–0.5)
Beta Globulin: 0.4 g/dL (ref 0.4–0.6)
Gamma Globulin: 0.6 g/dL — ABNORMAL LOW (ref 0.8–1.7)
Total Protein, Serum Electrophoresis: 6.2 g/dL (ref 6.1–8.1)

## 2014-06-27 LAB — ZINC: ZINC: 67 ug/dL (ref 60–130)

## 2014-06-27 LAB — COPPER, SERUM: COPPER: 92 ug/dL (ref 70–175)

## 2014-07-01 NOTE — Assessment & Plan Note (Signed)
treat similar to lateral epicondylitis.  Not much improvement from last visit.  Continue with brace, home exercises, nitro patches - can increase to 1/2 if tolerated.  Given injection today as well in area of maximal pain.  Icing, tylenol or aleve if needed.  Consider physical therapy if not improving.  F/u in 6 weeks.  After informed written consent patient was seated on exam table.  Area of maximal pain extensors of forearm proximially prepped with alcohol swab then injected with 2:1 marcaine; depomedrol with multiple needle fenestrations.  Patient tolerated procedure well without immediate complications.

## 2014-07-01 NOTE — Progress Notes (Signed)
PCP: Vicente Males, MD  Subjective:   HPI: Patient is a 69 y.o. male here for right shoulder, left forearm pain.  2/18: Patient reports pain in forearm started last year after hitting too many tennis balls (about 150) one day. Pain in on dorsal aspect of forearm. Has done a prednisone dose pack, dry needling, used a counterforce band, done some stretching. Did not do formal physical therapy for this. Pain in right shoulder started around September with repetitive motions in tennis. Is left handed. Tried ibuprofen 800mg  with minimal benefit. Tried voltaren gel for his shoulder with short term relief.  4/6: Patient reports the forearm is some improved. Using nitro, counterforce brace, doing home exercises. Right shoulder is not improved - using patches here also. Motion feels limited. Taking ibuprofen. Pain 4/10 in both areas currently.  Past Medical History  Diagnosis Date  . Fatigue   . Vitamin D deficiency   . Allergic rhinitis   . Hx of colonic polyps   . Depression   . Other diseases of lung, not elsewhere classified   . Gout   . Nephrolithiasis   . Hypertension   . Hyperlipidemia   . Family hx of melanoma   . Family hx of colon cancer     Current Outpatient Prescriptions on File Prior to Visit  Medication Sig Dispense Refill  . allopurinol (ZYLOPRIM) 300 MG tablet Take 300 mg by mouth daily.      Marland Kitchen amLODipine (NORVASC) 5 MG tablet     . aspirin 81 MG tablet Take 81 mg by mouth daily.      Marland Kitchen buPROPion (WELLBUTRIN XL) 150 MG 24 hr tablet TAKE ONE TABLET BY MOUTH ONCE DAILY IN THE MORNING 30 tablet 2  . IPRATROPIUM BROMIDE NA Place 2 sprays into the nose daily.    Marland Kitchen lamoTRIgine (LAMICTAL) 100 MG tablet Take 1 tablet (100 mg total) by mouth daily. 30 tablet 2  . losartan (COZAAR) 100 MG tablet Take 100 mg by mouth daily.    . nitroGLYCERIN (MINITRAN) 0.2 mg/hr patch Apply 1/4th patch to affected area, change daily 30 patch 1  . simvastatin (ZOCOR) 40 MG tablet  Take 40 mg by mouth daily.    . traZODone (DESYREL) 100 MG tablet Take 2 tablets (200 mg total) by mouth at bedtime. 60 tablet 2  . Vitamin D, Ergocalciferol, (DRISDOL) 50000 UNITS CAPS capsule Take by mouth.     No current facility-administered medications on file prior to visit.    Past Surgical History  Procedure Laterality Date  . Skin cancer excision      Allergies  Allergen Reactions  . Tetracycline Hcl     History   Social History  . Marital Status: Married    Spouse Name: N/A  . Number of Children: N/A  . Years of Education: N/A   Occupational History  . RETIRED    Social History Main Topics  . Smoking status: Former Smoker    Quit date: 03/22/1980  . Smokeless tobacco: Not on file  . Alcohol Use: 0.0 oz/week    0 Standard drinks or equivalent per week     Comment: 1 beer/mixed drink daily  . Drug Use: No  . Sexual Activity: Not on file   Other Topics Concern  . Not on file   Social History Narrative   Lives with wife.    Retired Forensic psychologist, Chief Strategy Officer.   Highest level of education:  JD    Family History  Problem Relation Age of Onset  .  Melanoma Mother     Deceased, 85  . Hypertension Mother   . Colon cancer Father     Deceased, 61  . Stroke Father   . Diabetes Mellitus II Brother   . Stroke Brother     Deceased, 80  . Depression Son     BP 101/67 mmHg  Pulse 72  Ht 5\' 9"  (1.753 m)  Wt 150 lb (68.04 kg)  BMI 22.14 kg/m2  Review of Systems: See HPI above.    Objective:  Physical Exam:  Gen: NAD  Right shoulder: No swelling, ecchymoses.  No gross deformity. No TTP. FROM with painful arc. Positive Neers, negative hawkins. Negative Speeds, Yergasons. Strength 5/5 with empty can and resisted internal/external rotation. Mild pain with o'briens. Negative apprehension. NV intact distally.  Left elbow/forearm: No gross deformity, swelling, bruising. Tenderness within extensor musculature of forearm proximally but not at lateral  epicondyle.   FROM elbow without pain. Pain with wrist extension, supination but not with finger extension, elbow motions. Collateral ligaments intact. NVI distally.    Assessment & Plan:  1. Left forearm strain - treat similar to lateral epicondylitis.  Not much improvement from last visit.  Continue with brace, home exercises, nitro patches - can increase to 1/2 if tolerated.  Given injection today as well in area of maximal pain.  Icing, tylenol or aleve if needed.  Consider physical therapy if not improving.  F/u in 6 weeks.  After informed written consent patient was seated on exam table.  Area of maximal pain extensors of forearm proximially prepped with alcohol swab then injected with 2:1 marcaine; depomedrol with multiple needle fenestrations.  Patient tolerated procedure well without immediate complications.  2. Right shoulder pain - 2/2 rotator cuff impingement.  Also not improving.  Continue home exercises, nsaids.  Injection given today.  Can increase nitro to 1/2 patch if tolerated.  Consider physical therapy if still not improving.  F/u in 6 weeks.  After informed written consent, patient was seated on exam table. Right shoulder was prepped with alcohol swab and utilizing posterior approach, patient's right subacromial space was injected with 3:1 marcaine: depomedrol. Patient tolerated the procedure well without immediate complications.

## 2014-07-01 NOTE — Assessment & Plan Note (Signed)
2/2 rotator cuff impingement.  Also not improving.  Continue home exercises, nsaids.  Injection given today.  Can increase nitro to 1/2 patch if tolerated.  Consider physical therapy if still not improving.  F/u in 6 weeks.  After informed written consent, patient was seated on exam table. Right shoulder was prepped with alcohol swab and utilizing posterior approach, patient's right subacromial space was injected with 3:1 marcaine: depomedrol. Patient tolerated the procedure well without immediate complications.

## 2014-07-04 ENCOUNTER — Ambulatory Visit (HOSPITAL_COMMUNITY): Payer: Self-pay | Admitting: Psychiatry

## 2014-07-05 DIAGNOSIS — G5762 Lesion of plantar nerve, left lower limb: Secondary | ICD-10-CM | POA: Diagnosis not present

## 2014-07-15 ENCOUNTER — Telehealth (HOSPITAL_COMMUNITY): Payer: Self-pay

## 2014-07-15 ENCOUNTER — Other Ambulatory Visit (HOSPITAL_COMMUNITY): Payer: Self-pay | Admitting: Psychiatry

## 2014-07-15 DIAGNOSIS — F329 Major depressive disorder, single episode, unspecified: Secondary | ICD-10-CM

## 2014-07-15 DIAGNOSIS — F32A Depression, unspecified: Secondary | ICD-10-CM

## 2014-07-15 MED ORDER — BUPROPION HCL ER (XL) 150 MG PO TB24: ORAL_TABLET | ORAL | Status: DC

## 2014-07-15 NOTE — Telephone Encounter (Signed)
Medication refill request for patient's prescribed Wellbutrin XL.  Order last written 04/04/14 plus 2 refills.  Returns on 07/17/14.

## 2014-07-17 ENCOUNTER — Encounter (HOSPITAL_COMMUNITY): Payer: Self-pay | Admitting: Psychiatry

## 2014-07-17 ENCOUNTER — Ambulatory Visit (INDEPENDENT_AMBULATORY_CARE_PROVIDER_SITE_OTHER): Payer: Medicare Other | Admitting: Psychiatry

## 2014-07-17 VITALS — BP 111/74 | HR 69 | Ht 69.0 in | Wt 149.8 lb

## 2014-07-17 DIAGNOSIS — F32A Depression, unspecified: Secondary | ICD-10-CM

## 2014-07-17 DIAGNOSIS — F329 Major depressive disorder, single episode, unspecified: Secondary | ICD-10-CM

## 2014-07-17 MED ORDER — TRAZODONE HCL 100 MG PO TABS: 200.0000 mg | ORAL_TABLET | Freq: Every day | ORAL | Status: DC

## 2014-07-17 MED ORDER — LAMOTRIGINE 100 MG PO TABS: 100.0000 mg | ORAL_TABLET | Freq: Every day | ORAL | Status: DC

## 2014-07-17 MED ORDER — BUPROPION HCL ER (XL) 150 MG PO TB24: ORAL_TABLET | ORAL | Status: DC

## 2014-07-17 NOTE — Progress Notes (Signed)
[facility] [phone] Progress Note  Colin Galvan [id number] 69 y.o.  07/17/2014 10:29 AM  Chief Complaint: I'm doing fine.           History of Present Illness:   Colin Galvan came for Colin followup appointment.  He is taking Colin medication as prescribed.  Earlier he has called that he has more irritability and anger that Lamictal 100 mg but now he feels it is working very well.  He continued to endorse marital stress which sometimes makes him more anxious and some time poor sleep.  He admitted racing thoughts and the night but denies any agitation, anger or any mood swings.  He denies any crying spells and Colin energy level is good.  He has no rash or itching.  I have noticed Colin memory started to get worse.  He admitted some time difficulty remembering things and do not remember details very well.  Recently he was seen sports medicine physician cause of tendinitis and also he was complaining of tingling and numbness in Colin foot .  He was seen neurologist who order multiple blood test which came out normal.  Colin B12, TSH and serum copper is normal.  however he did not have dementia workup.  He has decided to see Dr. [name] this afternoon .  Colin Galvan is seeing Dr. [name] for counseling.  He does not believe he need to see a Dr. [name] because Colin main issue is marriage counseling.  He denies any paranoia or any hallucination.  He has no tremors or shakes.  He denies any paranoia or any feeling of hopelessness or worthlessness.  Colin appetite is okay.  Colin weight is a stable.  He is happy that Colin blood pressure is normal.  He has good energy level.  He denies using any illegal substances.  He lives with Colin Galvan.    Suicidal Ideation: No Plan Formed: No Patient has means to carry out plan: No  Homicidal Ideation: No Plan Formed: No Patient has means to carry out plan: No  Review of Systems  Constitutional: Negative.   Cardiovascular: Negative.   Musculoskeletal: Positive for  joint pain.  Skin: Negative for itching and rash.  Neurological: Positive for tingling. Negative for tremors and headaches.  Psychiatric/Behavioral: Negative for suicidal ideas, hallucinations and substance abuse. The patient is nervous/anxious and has insomnia.        Cognitive impairment   Psychiatric: Agitation: No Hallucination: No Depressed Mood: No Insomnia: Yes Hypersomnia: No Altered Concentration: No Feels Worthless: No Grandiose Ideas: No Belief In Special Powers: No New/Increased Substance Abuse: No Compulsions: No  Neurologic: Headache: No Seizure: No Paresthesias: No  Medical History;  Patient has GERD, gout, history of melanoma and hypertension.  Colin primary care physician is Dr. [name] who is also a close friend of the patient.  Colin cardiologist is Raechel Chute in [place].  Outpatient Encounter Prescriptions as of 07/17/2014  Medication Sig  . allopurinol (ZYLOPRIM) 300 MG tablet Take 300 mg by mouth daily.    Marland Kitchen aspirin 81 MG tablet Take 81 mg by mouth daily.    Marland Kitchen buPROPion (WELLBUTRIN XL) 150 MG 24 hr tablet TAKE ONE TABLET BY MOUTH ONCE DAILY IN THE MORNING  . IPRATROPIUM BROMIDE NA Place 2 sprays into the nose daily.  Marland Kitchen lamoTRIgine (LAMICTAL) 100 MG tablet Take 1 tablet (100 mg total) by mouth daily.  Marland Kitchen losartan (COZAAR) 100 MG tablet Take 100 mg by mouth daily.  . simvastatin (ZOCOR) 40 MG  tablet Take 40 mg by mouth daily.  . traZODone (DESYREL) 100 MG tablet Take 2 tablets (200 mg total) by mouth at bedtime.  . Vitamin D, Ergocalciferol, (DRISDOL) 50000 UNITS CAPS capsule Take by mouth.  . [DISCONTINUED] buPROPion (WELLBUTRIN XL) 150 MG 24 hr tablet TAKE ONE TABLET BY MOUTH ONCE DAILY IN THE MORNING  . [DISCONTINUED] lamoTRIgine (LAMICTAL) 100 MG tablet Take 1 tablet (100 mg total) by mouth daily.  . [DISCONTINUED] traZODone (DESYREL) 100 MG tablet Take 2 tablets (200 mg total) by mouth at bedtime.  Marland Kitchen amLODipine (NORVASC) 5 MG tablet   .  nitroGLYCERIN (MINITRAN) 0.2 mg/hr patch Apply 1/4th patch to affected area, change daily (Patient not taking: Reported on 07/17/2014)   Recent Results (from the past 2160 hour(s))  Vitamin B12     Status: None   Collection Time: 06/25/14 10:57 AM  Result Value Ref Range   Vitamin B-12 879 211 - 911 pg/mL  TSH     Status: None   Collection Time: 06/25/14 10:57 AM  Result Value Ref Range   TSH 1.728 0.350 - 4.500 uIU/mL  Copper, serum     Status: None   Collection Time: 06/25/14 10:57 AM  Result Value Ref Range   Copper 92 70 - 175 mcg/dL  Ferritin     Status: None   Collection Time: 06/25/14 10:57 AM  Result Value Ref Range   Ferritin 39 22 - 322 ng/mL  Protein electrophoresis, serum     Status: Abnormal   Collection Time: 06/25/14 10:57 AM  Result Value Ref Range   Total Protein, Serum Electrophoresis 6.2 6.1 - 8.1 g/dL   Albumin ELP 4.1 3.8 - 4.8 g/dL   Alpha-1-Globulin 0.3 0.2 - 0.3 g/dL   Alpha-2-Globulin 0.5 0.5 - 0.9 g/dL   Beta Globulin 0.4 0.4 - 0.6 g/dL   Beta 2 0.3 0.2 - 0.5 g/dL   Gamma Globulin 0.6 (L) 0.8 - 1.7 g/dL   Abnormal Protein Band1 NOT DET g/dL   Abnormal Protein Band2 NOT DET g/dL   Abnormal Protein Band3 NOT DET g/dL   SPE Interp. SEE NOTE     Comment: Gamma globulins slightly decreased. Reviewed by Odis Hollingshead, MD, PhD, FCAP (Electronic Signature on File)    COMMENT (PROTEIN ELECTROPHOR) SEE NOTE     Comment: --------------- Serum protein electrophoresis is a useful screening procedure in the detection of various pathophysiologic states such as inflammation, gammopathies, protein loss and other dysproteinemias.  Immunofixation electrophoresis (IFE) is a more sensitive technique for the identification of M-proteins found in patients with monoclonal gammopathy of unknown significance (MGUS), amyloidosis, early or treated myeloma or macroglobulinemia, solitary plasmacytoma or extramedullary plasmacytoma.   ** Please note change in reference  range(s). **     IFE, Urine (with Tot Prot)     Status: Abnormal   Collection Time: 06/25/14 10:57 AM  Result Value Ref Range   Total Protein, Urine 7 5 - 25 mg/dL    Comment: No established reference range.   Total Protein, Urine-Ur/day NOT CALC <150 mg/day    Comment: Total urinary protein is determined by adding the albumin and Kappa and/or Lambda light chains.  This value may not agree with the total protein as determined by chemical methods, which characteristically underestimate urinary light chains.      Albumin, U DETECTED DETECTED   Alpha 1, Urine DETECTED (A) NONE DET   Alpha 2, Urine DETECTED (A) NONE DET   Beta, Urine DETECTED (A) NONE DET  Gamma Globulin, Urine DETECTED (A) NONE DET   Interpretation SEE NOTE     Comment: No monoclonal free light chains (Bence Jones Protein) are detected. Urine IFE shows polyclonal increase in free Kappa and/or free Lambda light chains.   Reviewed by Odis Hollingshead, MD, PhD, FCAP (Electronic Signature on File)   Zinc     Status: None   Collection Time: 06/25/14 10:57 AM  Result Value Ref Range   Zinc 67 60 - 130 mcg/dL     Past Psychiatric History/Hospitalization(s)  Patient has history of depression for almost 20 years.  In the past he has tried Cymbalta, Klonopin and Prestiq.  Patient denies any history of mania or psychosis.  He denies any history of psychiatric inpatient treatment or any suicidal attempt. Anxiety: Yes Bipolar Disorder: No Depression: Yes Mania: No Psychosis: No Schizophrenia: No Personality Disorder: No Hospitalization for psychiatric illness: No History of Electroconvulsive Shock Therapy: No Prior Suicide Attempts: No  Physical Exam: Constitutional:  BP 111/74 mmHg  Pulse 69  Ht 5\' 9"  (1.753 m)  Wt 149 lb 12.8 oz (67.949 kg)  BMI 22.11 kg/m2  Musculoskeletal: Strength & Muscle Tone: within normal limits Gait & Station: normal Patient leans: N/A  Mental Status Examination;  Patient is  well dressed and well groomed man.  He is pleasant and cooperative.  Colin speech is slow, clear and coherent.  Colin attention and concentration is fair.  He has some difficulty remembering things but he denies any active or passive suicidal thoughts or homicidal thought.  Colin fund of knowledge is good. Colin thought process is slow but logical.  There were no paranoia or delusions present at this time.  He described Colin mood is euthymic and Colin affect is mood appropriate.  There were no tremors or shakes.  He is alert and oriented x3.  Colin insight judgment and impulse control is okay.  Established Problem, Stable/Improving (1), Review of Psycho-Social Stressors (1), Review or order clinical lab tests (1), Review and summation of old records (2), New Problem, with no additional work-up planned (3), Review of Last Therapy Session (1), Review of Medication Regimen & Side Effects (2) and Review of New Medication or Change in Dosage (2)  Assessment: Axis I: Depressive disorder NOS, rule out bipolar disorder depressed type  Axis II: Deferred  Axis III: see medical history  Plan:   I reviewed records, recent blood work, collateral information and Colin psychosocial stressors.  I have noticed worsening memory impairment.  I recommended to see neurologist for cognitive impairment patient does not feel that he need any more appointment.  He reported Colin cognitive impairment is related to Colin age.  He like to continue Colin current medication.  He admitted distress coming from Colin marriage and is hoping to see Dr. [name] with Colin Galvan to address this issue.  He does not want to see Dr. [name] oh for individual counseling.  Discussed medication side effects and benefits.  I will continue Wellbutrin XL 150 mg daily, Lamictal 100 mg daily and trazodone 200 mg at bedtime.  Recommended to call us back if he has any question or any concern.  Follow-up in 3 months.  Time spent 25 minutes.  More than 50% of the time spent  in psychoeducation, counseling and coordination of care.  Discuss safety plan that anytime having active suicidal thoughts or homicidal thoughts then patient need to call 911 or go to the local emergency room.  ARFEEN,SYED T., MD 07/17/2014

## 2014-07-19 DIAGNOSIS — M2669 Other specified disorders of temporomandibular joint: Secondary | ICD-10-CM | POA: Diagnosis not present

## 2014-08-08 ENCOUNTER — Ambulatory Visit: Payer: Self-pay | Admitting: Family Medicine

## 2014-08-14 DIAGNOSIS — L821 Other seborrheic keratosis: Secondary | ICD-10-CM | POA: Diagnosis not present

## 2014-08-14 DIAGNOSIS — D485 Neoplasm of uncertain behavior of skin: Secondary | ICD-10-CM | POA: Diagnosis not present

## 2014-08-27 DIAGNOSIS — N200 Calculus of kidney: Secondary | ICD-10-CM | POA: Diagnosis not present

## 2014-08-27 DIAGNOSIS — F439 Reaction to severe stress, unspecified: Secondary | ICD-10-CM | POA: Diagnosis not present

## 2014-08-27 DIAGNOSIS — E785 Hyperlipidemia, unspecified: Secondary | ICD-10-CM | POA: Diagnosis not present

## 2014-08-27 DIAGNOSIS — R05 Cough: Secondary | ICD-10-CM | POA: Diagnosis not present

## 2014-08-27 DIAGNOSIS — J449 Chronic obstructive pulmonary disease, unspecified: Secondary | ICD-10-CM | POA: Diagnosis not present

## 2014-08-27 DIAGNOSIS — J301 Allergic rhinitis due to pollen: Secondary | ICD-10-CM | POA: Diagnosis not present

## 2014-08-27 DIAGNOSIS — M109 Gout, unspecified: Secondary | ICD-10-CM | POA: Diagnosis not present

## 2014-08-27 DIAGNOSIS — C4490 Unspecified malignant neoplasm of skin, unspecified: Secondary | ICD-10-CM | POA: Diagnosis not present

## 2014-08-28 DIAGNOSIS — L820 Inflamed seborrheic keratosis: Secondary | ICD-10-CM | POA: Diagnosis not present

## 2014-08-29 ENCOUNTER — Encounter (HOSPITAL_COMMUNITY): Payer: Self-pay | Admitting: Psychology

## 2014-08-29 NOTE — Progress Notes (Signed)
   THERAPIST PROGRESS NOTE  Session Time: 9 AM  Participation Level: Active  Behavioral Response: Well GroomedAlertDepressed  Type of Therapy: Individual Therapy  Treatment Goals addressed: Coping  Interventions: CBT  Summary: Colin Galvan. is a 69 y.o. male who presents with issues related to struggles between he and his wife and stress associated with recurrent depression.   Suicidal/Homicidal: Negativewithout intent/plan  Therapist Response: The patient has continued to struggle with issues related to his marriage and the conflicts that regularly spur between the patient and his wife. The patient tends to avoid conflicts when they arise in the patient's wife becomes very upset when these are not addressed. We worked on Radiographer, therapeutic and strategies around these issues.  Plan: Return again in 4 weeks weeks.  Diagnosis: Axis I: Depressive Disorder NOS    Axis II: No diagnosis    Taran Hable R, PsyD 08/29/2014

## 2014-09-05 DIAGNOSIS — E785 Hyperlipidemia, unspecified: Secondary | ICD-10-CM | POA: Diagnosis not present

## 2014-10-16 ENCOUNTER — Encounter (HOSPITAL_COMMUNITY): Payer: Self-pay | Admitting: Psychiatry

## 2014-10-16 ENCOUNTER — Other Ambulatory Visit (HOSPITAL_COMMUNITY): Payer: Self-pay | Admitting: Psychiatry

## 2014-10-16 ENCOUNTER — Ambulatory Visit (INDEPENDENT_AMBULATORY_CARE_PROVIDER_SITE_OTHER): Payer: Medicare Other | Admitting: Psychiatry

## 2014-10-16 VITALS — BP 119/79 | HR 66 | Ht 68.0 in | Wt 152.4 lb

## 2014-10-16 DIAGNOSIS — F32A Depression, unspecified: Secondary | ICD-10-CM

## 2014-10-16 DIAGNOSIS — F329 Major depressive disorder, single episode, unspecified: Secondary | ICD-10-CM

## 2014-10-16 MED ORDER — LAMOTRIGINE 100 MG PO TABS: 100.0000 mg | ORAL_TABLET | Freq: Every day | ORAL | Status: DC

## 2014-10-16 MED ORDER — TRAZODONE HCL 100 MG PO TABS: 200.0000 mg | ORAL_TABLET | Freq: Every day | ORAL | Status: DC

## 2014-10-16 MED ORDER — BUPROPION HCL ER (XL) 150 MG PO TB24: ORAL_TABLET | ORAL | Status: DC

## 2014-10-16 NOTE — Progress Notes (Signed)
[facility] [phone] Progress Note  Colin Galvan [id number] 69 y.o.  10/16/2014 10:45 AM  Chief Complaint: Medication management and follow-up.             History of Present Illness:   Colin Galvan came for his followup appointment.  He is taking his medication as prescribed and denies any side effects.  He admitted some time irritability with his wife but denies any anger or any major issues.  His wife continues to see Dr. [name] for counseling .  Patient has stopped seeing Dr. [name] and he does not feel he needed counseling at this time.  He sleeping good with the trazodone.  He continues to play tennis however he had noticed some decline is an energy level overall.  He also endorsed some time forgetful but admitted these changes could be due to his age.  He likes his current medication.  He has no rash or itching.  He denies any paranoia or any hallucination.  Recently he seen his cardiologist and carvedilol 6.25 mg was added.  He denies any paranoia or any hallucination.  He denies any crying spells or any feeling of hopelessness or worthlessness.  His weight is stable.  His appetite is okay.  His vitals are stable.  Patient denies any illegal substance use.  He lives with his wife.  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  Suicidal Ideation: No Plan Formed: No Patient has means to carry out plan: No  Homicidal Ideation: No Plan Formed: No Patient has means to carry out plan: No  Review of Systems  Constitutional: Negative.   Cardiovascular: Negative.   Skin: Negative for itching and rash.  Psychiatric/Behavioral:       Cognitive impairment   Psychiatric: Agitation: No Hallucination: No Depressed Mood: No Insomnia: No Hypersomnia: No Altered Concentration: No Feels Worthless: No Grandiose Ideas: No Belief In Special Powers: No New/Increased Substance Abuse: No Compulsions: No  Neurologic: Headache: No Seizure: No Paresthesias:  No  Medical History;  Patient has GERD, gout, history of melanoma and hypertension.  His primary care physician is Dr. [name] who is also a close friend of the patient.  His cardiologist is Raechel Chute in [place].  Outpatient Encounter Prescriptions as of 10/16/2014  Medication Sig  . allopurinol (ZYLOPRIM) 300 MG tablet Take 300 mg by mouth daily.    Marland Kitchen amLODipine (NORVASC) 5 MG tablet   . aspirin 81 MG tablet Take 81 mg by mouth daily.    Marland Kitchen buPROPion (WELLBUTRIN XL) 150 MG 24 hr tablet TAKE ONE TABLET BY MOUTH ONCE DAILY IN THE MORNING  . carvedilol (COREG) 6.25 MG tablet   . IPRATROPIUM BROMIDE NA Place 2 sprays into the nose daily.  Marland Kitchen lamoTRIgine (LAMICTAL) 100 MG tablet Take 1 tablet (100 mg total) by mouth daily.  Marland Kitchen losartan (COZAAR) 100 MG tablet Take 100 mg by mouth daily.  . simvastatin (ZOCOR) 40 MG tablet Take 40 mg by mouth daily.  . traZODone (DESYREL) 100 MG tablet Take 2 tablets (200 mg total) by mouth at bedtime.  . Vitamin D, Ergocalciferol, (DRISDOL) 50000 UNITS CAPS capsule Take by mouth.  . [DISCONTINUED] buPROPion (WELLBUTRIN XL) 150 MG 24 hr tablet TAKE ONE TABLET BY MOUTH ONCE DAILY IN THE MORNING  . [DISCONTINUED] lamoTRIgine (LAMICTAL) 100 MG tablet Take 1 tablet (100 mg total) by mouth daily.  . [DISCONTINUED] nitroGLYCERIN (MINITRAN) 0.2 mg/hr patch Apply 1/4th patch to affected area, change daily (Patient not  taking: Reported on 07/17/2014)  . [DISCONTINUED] traZODone (DESYREL) 100 MG tablet Take 2 tablets (200 mg total) by mouth at bedtime.   No facility-administered encounter medications on file as of 10/16/2014.   No results found for this or any previous visit (from the past 2160 hour(s)).   Past Psychiatric History/Hospitalization(s)  Patient has history of depression for almost 20 years.  In the past he has tried Cymbalta, Klonopin and Prestiq.  Patient denies any history of mania or psychosis.  He denies any history of psychiatric inpatient  treatment or any suicidal attempt. Anxiety: Yes Bipolar Disorder: No Depression: Yes Mania: No Psychosis: No Schizophrenia: No Personality Disorder: No Hospitalization for psychiatric illness: No History of Electroconvulsive Shock Therapy: No Prior Suicide Attempts: No  Physical Exam: Constitutional:  BP 119/79 mmHg  Pulse 66  Ht 5\' 8"  (1.727 m)  Wt 152 lb 6.4 oz (69.128 kg)  BMI 23.18 kg/m2  Musculoskeletal: Strength & Muscle Tone: within normal limits Gait & Station: normal Patient leans: N/A  Mental Status Examination;  Patient is well dressed and well groomed man.  He is pleasant and cooperative.  His speech is slow, clear and coherent.  His attention and concentration is fair.  He has some difficulty remembering things but he denies any active or passive suicidal thoughts or homicidal thought.  His fund of knowledge is good. His thought process is slow but logical.  There were no paranoia or delusions present at this time.  He described his mood is euthymic and his affect is mood appropriate.  There were no tremors or shakes.  He is alert and oriented x3.  His insight judgment and impulse control is okay.  Established Problem, Stable/Improving (1), Review of Last Therapy Session (1) and Review of Medication Regimen & Side Effects (2)  Assessment: Axis I: Depressive disorder NOS, rule out bipolar disorder depressed type  Axis II: Deferred  Axis III: see medical history  Plan:  Patient is a stable on his current psychometric with medication.  He has no rash itching or any side effects.  Continue Lamictal 100 mg daily, trazodone 200 mg at bedtime and Wellbutrin XL 150 mg daily.  He has no tremors shakes or any side effects.  He is not interested in counseling.  However he agreed to call us back if he feels he needed counseling or any help.  I will see him again in 3 months.  Aleecia Tapia T., MD 10/16/2014

## 2014-10-25 DIAGNOSIS — M2669 Other specified disorders of temporomandibular joint: Secondary | ICD-10-CM | POA: Diagnosis not present

## 2014-10-29 DIAGNOSIS — J342 Deviated nasal septum: Secondary | ICD-10-CM | POA: Diagnosis not present

## 2014-10-29 DIAGNOSIS — J309 Allergic rhinitis, unspecified: Secondary | ICD-10-CM | POA: Diagnosis not present

## 2014-11-12 DIAGNOSIS — Z85828 Personal history of other malignant neoplasm of skin: Secondary | ICD-10-CM | POA: Diagnosis not present

## 2014-11-12 DIAGNOSIS — C434 Malignant melanoma of scalp and neck: Secondary | ICD-10-CM | POA: Diagnosis not present

## 2014-11-12 DIAGNOSIS — L570 Actinic keratosis: Secondary | ICD-10-CM | POA: Diagnosis not present

## 2014-11-20 DIAGNOSIS — H10423 Simple chronic conjunctivitis, bilateral: Secondary | ICD-10-CM | POA: Diagnosis not present

## 2014-11-20 DIAGNOSIS — H2513 Age-related nuclear cataract, bilateral: Secondary | ICD-10-CM | POA: Diagnosis not present

## 2014-11-20 DIAGNOSIS — H16223 Keratoconjunctivitis sicca, not specified as Sjogren's, bilateral: Secondary | ICD-10-CM | POA: Diagnosis not present

## 2014-11-20 DIAGNOSIS — H0289 Other specified disorders of eyelid: Secondary | ICD-10-CM | POA: Diagnosis not present

## 2014-12-10 DIAGNOSIS — G5760 Lesion of plantar nerve, unspecified lower limb: Secondary | ICD-10-CM | POA: Diagnosis not present

## 2014-12-11 DIAGNOSIS — E785 Hyperlipidemia, unspecified: Secondary | ICD-10-CM | POA: Diagnosis not present

## 2014-12-11 DIAGNOSIS — I10 Essential (primary) hypertension: Secondary | ICD-10-CM | POA: Diagnosis not present

## 2014-12-18 ENCOUNTER — Telehealth (HOSPITAL_COMMUNITY): Payer: Self-pay

## 2014-12-18 NOTE — Telephone Encounter (Signed)
Telephone call with patient to inform our office did not have him scheduled for a follow up appointment and last seen 10/16/14 by Dr. Adele Schilder but we had now received a request for 90 day orders for his medications.  Patient reported he typically returns every 6 months and would like medications changed to 90 days.  Arranged with patient to question if Dr. Adele Schilder was okay with changing patient over to 90 day orders and with assisting patient with a new appointment at the end of that 90 days if Dr. Adele Schilder okay with patient going 5-6 months from 10/16/14 appointment.

## 2014-12-19 ENCOUNTER — Other Ambulatory Visit (HOSPITAL_COMMUNITY): Payer: Self-pay | Admitting: Psychiatry

## 2014-12-19 DIAGNOSIS — F329 Major depressive disorder, single episode, unspecified: Secondary | ICD-10-CM

## 2014-12-19 DIAGNOSIS — F32A Depression, unspecified: Secondary | ICD-10-CM

## 2014-12-19 MED ORDER — BUPROPION HCL ER (XL) 150 MG PO TB24: ORAL_TABLET | ORAL | Status: DC

## 2014-12-19 MED ORDER — TRAZODONE HCL 100 MG PO TABS: 200.0000 mg | ORAL_TABLET | Freq: Every day | ORAL | Status: DC

## 2014-12-19 MED ORDER — LAMOTRIGINE 100 MG PO TABS: 100.0000 mg | ORAL_TABLET | Freq: Every day | ORAL | Status: DC

## 2014-12-19 NOTE — Telephone Encounter (Signed)
Okay with 90 day supply and patient can be seen in 3 months.  He will need appointment.

## 2014-12-19 NOTE — Progress Notes (Signed)
We will do 90 day supply.

## 2014-12-19 NOTE — Telephone Encounter (Signed)
Left patient a message Dr. Adele Schilder had e-scribed in all new 63 day orders for his medications and patient is now scheduled to return on 03/03/15 at 3pm.  Requested patient call back if any problems getting medications filled at his [facility] in [place] or if problems with new appointment time.

## 2014-12-31 DIAGNOSIS — M67919 Unspecified disorder of synovium and tendon, unspecified shoulder: Secondary | ICD-10-CM | POA: Diagnosis not present

## 2014-12-31 DIAGNOSIS — M719 Bursopathy, unspecified: Secondary | ICD-10-CM | POA: Diagnosis not present

## 2015-01-31 DIAGNOSIS — M26629 Arthralgia of temporomandibular joint, unspecified side: Secondary | ICD-10-CM | POA: Diagnosis not present

## 2015-02-05 DIAGNOSIS — N400 Enlarged prostate without lower urinary tract symptoms: Secondary | ICD-10-CM | POA: Diagnosis not present

## 2015-02-11 DIAGNOSIS — K645 Perianal venous thrombosis: Secondary | ICD-10-CM | POA: Diagnosis not present

## 2015-02-11 DIAGNOSIS — N400 Enlarged prostate without lower urinary tract symptoms: Secondary | ICD-10-CM | POA: Diagnosis not present

## 2015-02-18 ENCOUNTER — Ambulatory Visit (INDEPENDENT_AMBULATORY_CARE_PROVIDER_SITE_OTHER): Payer: Medicare Other | Admitting: Psychiatry

## 2015-02-18 ENCOUNTER — Encounter (HOSPITAL_COMMUNITY): Payer: Self-pay | Admitting: Psychiatry

## 2015-02-18 VITALS — BP 104/70 | HR 68 | Resp 12 | Wt 146.2 lb

## 2015-02-18 DIAGNOSIS — F32A Depression, unspecified: Secondary | ICD-10-CM

## 2015-02-18 DIAGNOSIS — F329 Major depressive disorder, single episode, unspecified: Secondary | ICD-10-CM | POA: Diagnosis not present

## 2015-02-18 MED ORDER — LAMOTRIGINE 150 MG PO TABS: 150.0000 mg | ORAL_TABLET | Freq: Every day | ORAL | Status: DC

## 2015-02-18 MED ORDER — BUPROPION HCL ER (XL) 150 MG PO TB24: ORAL_TABLET | ORAL | Status: DC

## 2015-02-18 MED ORDER — TRAZODONE HCL 100 MG PO TABS: 200.0000 mg | ORAL_TABLET | Freq: Every day | ORAL | Status: DC

## 2015-02-18 NOTE — Progress Notes (Signed)
[facility] [phone] Progress Note  Colin Galvan [id number] 69 y.o.  02/18/2015 10:59 AM  Chief Complaint: I have noticed I get easily frustrated and irritable.  I don't think my current dose of Lamictal working very well.               History of Present Illness:   Colin Galvan came for his followup appointment.  He is taking his medication as prescribed.  He has noticed more irritability, frustration and short temper and recent months.  He is not sure what triggered that but like to resolve this issue.  He reported his wrist showed with the wife is going very well.  His wife is no longer seeing Dr. [name] .  Patient admitted he stopped playing tennis because of tennis elbow and recently he has pain in his right shoulder.  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  He is excited about upcoming visit to [place] for skiing but he is not sure due to his pain in his shoulder he can ski .  He sleeping good.  He denies any paranoia, hallucination, rage or any crying spells.  Today his blood pressure is slightly decreased .  He is taking multiple medication for his heart .  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  He had appointment with a cardiologist and he will discuss if his blood pressure medicine dosage can be reduced.  Patient has no other associated symptoms including any chest pain, dizziness, palpitation, sweating.  Patient denies drinking or using any illegal substances.  His energy level is good.  His appetite is okay.  He denies any feeling of hopelessness or worthlessness.  He lives with his wife who is supportive.  Patient is retired.  Suicidal Ideation: No Plan Formed: No Patient has means to carry out plan: No  Homicidal Ideation: No Plan Formed: No Patient has means to carry out plan: No  Review of Systems   Constitutional: Negative.   Cardiovascular: Negative.   Skin: Negative for itching and rash.  Neurological: Negative for headaches.  Psychiatric/Behavioral:       Mild Cognitive impairment   Psychiatric: Agitation: No Hallucination: No Depressed Mood: No Insomnia: No Hypersomnia: No Altered Concentration: No Feels Worthless: No Grandiose Ideas: No Belief In Special Powers: No New/Increased Substance Abuse: No Compulsions: No  Neurologic: Headache: No Seizure: No Paresthesias: No  Medical History;  Patient has GERD, gout, history of melanoma and hypertension.  His primary care physician is Dr. [name] who is also a close friend of the patient.  His cardiologist is Raechel Chute in [place].  Outpatient Encounter Prescriptions as of 02/18/2015  Medication Sig  . allopurinol (ZYLOPRIM) 300 MG tablet Take 300 mg by mouth daily.    Marland Kitchen amLODipine (NORVASC) 5 MG tablet   . aspirin 81 MG tablet Take 81 mg by mouth daily.    Marland Kitchen buPROPion (WELLBUTRIN XL) 150 MG 24 hr tablet TAKE ONE TABLET BY MOUTH ONCE DAILY IN THE MORNING  . carvedilol (COREG) 6.25 MG tablet   . IPRATROPIUM BROMIDE NA Place 2 sprays into the nose daily.  Marland Kitchen lamoTRIgine (LAMICTAL) 150 MG tablet Take 1 tablet (150 mg total) by mouth daily.  Marland Kitchen losartan (COZAAR) 100 MG tablet Take 100 mg by mouth daily.  Marland Kitchen omeprazole (PRILOSEC) 20 MG capsule   . simvastatin (  ZOCOR) 40 MG tablet Take 40 mg by mouth daily.  . traZODone (DESYREL) 100 MG tablet Take 2 tablets (200 mg total) by mouth at bedtime.  . Vitamin D, Ergocalciferol, (DRISDOL) 50000 UNITS CAPS capsule Take by mouth.  . [DISCONTINUED] buPROPion (WELLBUTRIN XL) 150 MG 24 hr tablet TAKE ONE TABLET BY MOUTH ONCE DAILY IN THE MORNING  . [DISCONTINUED] lamoTRIgine (LAMICTAL) 100 MG tablet Take 1 tablet (100 mg total) by mouth daily.  . [DISCONTINUED] traZODone (DESYREL) 100 MG tablet Take 2 tablets (200 mg total) by mouth at bedtime.   No facility-administered  encounter medications on file as of 02/18/2015.   No results found for this or any previous visit (from the past 2160 hour(s)).   Past Psychiatric History/Hospitalization(s)  Patient has history of depression for almost 20 years.  In the past he has tried Cymbalta, Klonopin and Prestiq.  Patient denies any history of mania or psychosis.  He denies any history of psychiatric inpatient treatment or any suicidal attempt. Anxiety: Yes Bipolar Disorder: No Depression: Yes Mania: No Psychosis: No Schizophrenia: No Personality Disorder: No Hospitalization for psychiatric illness: No History of Electroconvulsive Shock Therapy: No Prior Suicide Attempts: No  Physical Exam: Constitutional:  BP 104/70 mmHg  Pulse 68  Resp 12  Wt 146 lb 3.2 oz (66.316 kg)  Musculoskeletal: Strength & Muscle Tone: within normal limits Gait & Station: normal Patient leans: N/A  Mental Status Examination;  Patient is well dressed and well groomed man.  He is pleasant and cooperative.  His speech is slow, clear and coherent.  His attention and concentration is fair.  He has some difficulty remembering things but he denies any active or passive suicidal thoughts or homicidal thought.  His fund of knowledge is good. His thought process is slow but logical.  There were no paranoia or delusions present at this time.  He described his mood is euthymic and his affect is mood appropriate.  There were no tremors or shakes.  He is alert and oriented x3.  His insight judgment and impulse control is okay.  Established Problem, Stable/Improving (1), Review of Psycho-Social Stressors (1), Established Problem, Worsening (2), New Problem, with no additional work-up planned (3), Review of Last Therapy Session (1), Review of Medication Regimen & Side Effects (2) and Review of New Medication or Change in Dosage (2)  Assessment: Axis I: Depressive disorder NOS, rule out bipolar disorder depressed type  Axis II: Deferred  Axis  III: see medical history  Plan:  I review his medication .  I recommended to try Lamictal 150 mg daily to help his irritability and frustration.  He is tolerating his medication without any side effects.  He has no rash or itching.  I also discuss lowering the trazodone since he has mild hypotension but patient does not want to change trazodone dosage.  He sleeping better.  He promised that he will discuss with his cardiologist about his blood pressure reading .  He is taking multiple medication for his blood pressure.  I will continue Wellbutrin XL 150 mg daily and trazodone 200 mg daily.  I recommended to call us back if increase Lamictal did not help his mood and irritability.  Reminded if rash appears that he needed to stop the medication immediately.  Patient has no tremors or EPS.  Recommended to call us back if he has any question or any concern.  Follow-up in 3 months.  Brean Carberry T., MD 02/18/2015

## 2015-03-03 ENCOUNTER — Ambulatory Visit (HOSPITAL_COMMUNITY): Payer: Self-pay | Admitting: Psychiatry

## 2015-03-05 DIAGNOSIS — F439 Reaction to severe stress, unspecified: Secondary | ICD-10-CM | POA: Diagnosis not present

## 2015-03-05 DIAGNOSIS — K644 Residual hemorrhoidal skin tags: Secondary | ICD-10-CM | POA: Diagnosis not present

## 2015-03-05 DIAGNOSIS — N200 Calculus of kidney: Secondary | ICD-10-CM | POA: Diagnosis not present

## 2015-03-05 DIAGNOSIS — M109 Gout, unspecified: Secondary | ICD-10-CM | POA: Diagnosis not present

## 2015-03-05 DIAGNOSIS — Z23 Encounter for immunization: Secondary | ICD-10-CM | POA: Diagnosis not present

## 2015-03-05 DIAGNOSIS — C4490 Unspecified malignant neoplasm of skin, unspecified: Secondary | ICD-10-CM | POA: Diagnosis not present

## 2015-03-05 DIAGNOSIS — J324 Chronic pansinusitis: Secondary | ICD-10-CM | POA: Diagnosis not present

## 2015-03-05 DIAGNOSIS — J301 Allergic rhinitis due to pollen: Secondary | ICD-10-CM | POA: Diagnosis not present

## 2015-03-06 DIAGNOSIS — M67911 Unspecified disorder of synovium and tendon, right shoulder: Secondary | ICD-10-CM | POA: Diagnosis not present

## 2015-03-07 DIAGNOSIS — R0602 Shortness of breath: Secondary | ICD-10-CM | POA: Diagnosis not present

## 2015-03-12 DIAGNOSIS — M719 Bursopathy, unspecified: Secondary | ICD-10-CM | POA: Diagnosis not present

## 2015-03-12 DIAGNOSIS — M67919 Unspecified disorder of synovium and tendon, unspecified shoulder: Secondary | ICD-10-CM | POA: Diagnosis not present

## 2015-03-25 DIAGNOSIS — J342 Deviated nasal septum: Secondary | ICD-10-CM | POA: Diagnosis not present

## 2015-03-25 DIAGNOSIS — J387 Other diseases of larynx: Secondary | ICD-10-CM | POA: Diagnosis not present

## 2015-03-25 DIAGNOSIS — J302 Other seasonal allergic rhinitis: Secondary | ICD-10-CM | POA: Diagnosis not present

## 2015-04-01 DIAGNOSIS — G5760 Lesion of plantar nerve, unspecified lower limb: Secondary | ICD-10-CM | POA: Diagnosis not present

## 2015-04-29 DIAGNOSIS — M25511 Pain in right shoulder: Secondary | ICD-10-CM | POA: Diagnosis not present

## 2015-05-06 DIAGNOSIS — M25511 Pain in right shoulder: Secondary | ICD-10-CM | POA: Diagnosis not present

## 2015-05-08 DIAGNOSIS — M7540 Impingement syndrome of unspecified shoulder: Secondary | ICD-10-CM | POA: Diagnosis not present

## 2015-05-08 DIAGNOSIS — M25511 Pain in right shoulder: Secondary | ICD-10-CM | POA: Diagnosis not present

## 2015-05-13 DIAGNOSIS — M25511 Pain in right shoulder: Secondary | ICD-10-CM | POA: Diagnosis not present

## 2015-05-15 DIAGNOSIS — M25511 Pain in right shoulder: Secondary | ICD-10-CM | POA: Diagnosis not present

## 2015-05-15 DIAGNOSIS — R0602 Shortness of breath: Secondary | ICD-10-CM | POA: Diagnosis not present

## 2015-05-15 DIAGNOSIS — K4090 Unilateral inguinal hernia, without obstruction or gangrene, not specified as recurrent: Secondary | ICD-10-CM | POA: Diagnosis not present

## 2015-05-19 ENCOUNTER — Encounter (HOSPITAL_COMMUNITY): Payer: Self-pay | Admitting: Psychiatry

## 2015-05-19 ENCOUNTER — Ambulatory Visit (INDEPENDENT_AMBULATORY_CARE_PROVIDER_SITE_OTHER): Payer: Medicare Other | Admitting: Psychiatry

## 2015-05-19 VITALS — BP 117/79 | HR 70 | Ht 69.0 in | Wt 149.0 lb

## 2015-05-19 DIAGNOSIS — F32A Depression, unspecified: Secondary | ICD-10-CM

## 2015-05-19 DIAGNOSIS — F329 Major depressive disorder, single episode, unspecified: Secondary | ICD-10-CM

## 2015-05-19 MED ORDER — LAMOTRIGINE 150 MG PO TABS: 150.0000 mg | ORAL_TABLET | Freq: Every day | ORAL | Status: DC

## 2015-05-19 MED ORDER — BUPROPION HCL ER (XL) 150 MG PO TB24: ORAL_TABLET | ORAL | Status: DC

## 2015-05-19 MED ORDER — TRAZODONE HCL 100 MG PO TABS: 200.0000 mg | ORAL_TABLET | Freq: Every day | ORAL | Status: DC

## 2015-05-19 NOTE — Progress Notes (Signed)
[facility] [phone] Progress Note  Colin Galvan [id number] 70 y.o.  05/19/2015 10:56 AM  Chief Complaint: I like increase Lamictal.  My depression is better.                 History of Present Illness:   Colin Galvan came earlier than his is scheduled appointment.  His appointment was 2 days from now but he forgot and came today believing he has appointment today.  On his last visit we increase Lamictal 150.  He is tolerating his medication and denies any side effects.  He seen much improvement in his mood and depression.  However he fell while skiing in [place] and injured his shoulder.  He is getting physical therapy.  He was given Moban but he has not taken it because he believed one of the medicine causing insomnia.  He also stopped taking Zyrtec.  He admitted since taking higher dose of Lamictal he has noticed improvement in his mood .  He is no longer seeing Dr. [name] counseling.  He endorse good relationship with his wife and denies any irritability, anger, mood swing.  Apart from insomnia he has no other symptoms.  He has no rash itching or any headaches.  He denies any EPS or any tremors.  Patient continues to have struggled remembering things.  Patient denies drinking or using any illegal substances.  His appetite is okay but he believed he lost weight.  His energy level is okay.  He lives with his wife who is very supportive.  He is taking Wellbutrin, trazodone and Lamictal.  Suicidal Ideation: No Plan Formed: No Patient has means to carry out plan: No  Homicidal Ideation: No Plan Formed: No Patient has means to carry out plan: No  Review of Systems  Constitutional: Negative.   Cardiovascular: Negative.   Skin: Negative for itching and rash.  Neurological: Negative for headaches.  Psychiatric/Behavioral:       Mild Cognitive impairment   Psychiatric: Agitation: No Hallucination: No Depressed Mood: No Insomnia: No Hypersomnia: No Altered Concentration:  No Feels Worthless: No Grandiose Ideas: No Belief In Special Powers: No New/Increased Substance Abuse: No Compulsions: No  Neurologic: Headache: No Seizure: No Paresthesias: No  Medical History;  Patient has GERD, gout, history of melanoma and hypertension.  His primary care physician is Dr. [name] who is also a close friend of the patient.  His cardiologist is Raechel Chute in [place].  Outpatient Encounter Prescriptions as of 05/19/2015  Medication Sig  . Cetirizine HCl 10 MG CAPS Take by mouth.  Marland Kitchen allopurinol (ZYLOPRIM) 300 MG tablet Take 300 mg by mouth daily.    Marland Kitchen amLODipine (NORVASC) 5 MG tablet   . aspirin 81 MG tablet Take 81 mg by mouth daily.    Marland Kitchen buPROPion (WELLBUTRIN XL) 150 MG 24 hr tablet TAKE ONE TABLET BY MOUTH ONCE DAILY IN THE MORNING  . carvedilol (COREG) 6.25 MG tablet   . IPRATROPIUM BROMIDE NA Place 2 sprays into the nose daily.  Marland Kitchen lamoTRIgine (LAMICTAL) 150 MG tablet Take 1 tablet (150 mg total) by mouth daily.  Marland Kitchen losartan (COZAAR) 100 MG tablet Take 100 mg by mouth daily.  Marland Kitchen omeprazole (PRILOSEC) 20 MG capsule   . simvastatin (ZOCOR) 40 MG tablet Take 40 mg by mouth daily.  . traZODone (DESYREL) 100 MG tablet Take 2 tablets (200 mg total) by mouth at bedtime.  . Vitamin D, Ergocalciferol, (DRISDOL) 50000 UNITS CAPS capsule Take by mouth.  . [DISCONTINUED] buPROPion (  WELLBUTRIN XL) 150 MG 24 hr tablet TAKE ONE TABLET BY MOUTH ONCE DAILY IN THE MORNING  . [DISCONTINUED] lamoTRIgine (LAMICTAL) 150 MG tablet Take 1 tablet (150 mg total) by mouth daily.  . [DISCONTINUED] traZODone (DESYREL) 100 MG tablet Take 2 tablets (200 mg total) by mouth at bedtime.   No facility-administered encounter medications on file as of 05/19/2015.   No results found for this or any previous visit (from the past 2160 hour(s)).   Past Psychiatric History/Hospitalization(s)  Patient has history of depression for almost 20 years.  In the past he has tried Cymbalta, Klonopin  and Prestiq.  Patient denies any history of mania or psychosis.  He denies any history of psychiatric inpatient treatment or any suicidal attempt. Anxiety: Yes Bipolar Disorder: No Depression: Yes Mania: No Psychosis: No Schizophrenia: No Personality Disorder: No Hospitalization for psychiatric illness: No History of Electroconvulsive Shock Therapy: No Prior Suicide Attempts: No  Physical Exam: Constitutional:  BP 117/79 mmHg  Pulse 70  Ht 5\' 9"  (1.753 m)  Wt 149 lb (67.586 kg)  BMI 21.99 kg/m2  Musculoskeletal: Strength & Muscle Tone: within normal limits Gait & Station: normal Patient leans: N/A  Mental Status Examination;  Patient is well dressed and well groomed man.  He is pleasant and cooperative.  His speech is slow, clear and coherent.  His attention and concentration is fair.  He has some difficulty remembering things but he denies any active or passive suicidal thoughts or homicidal thought.  His fund of knowledge is good. His thought process is slow but logical.  There were no paranoia or delusions present at this time.  He described his mood is euthymic and his affect is mood appropriate.  There were no tremors or shakes.  He is alert and oriented x3.  His insight judgment and impulse control is okay.  Established Problem, Stable/Improving (1), Review of Last Therapy Session (1) and Review of Medication Regimen & Side Effects (2)  Assessment: Axis I: Depressive disorder NOS, rule out bipolar disorder depressed type  Axis II: Deferred  Axis III: see medical history  Plan:  Patient doing better on his current psychiatric medication.  I will continue Lamictal 150 mg daily, trazodone 200 mg at bedtime and Wellbutrin XL 150 mg daily.  Discussed medication side effects and benefits.  Recommended to call us back if he has any question or any concern.  Follow-up in 3 months.   Dayden Viverette T., MD 05/19/2015

## 2015-05-20 DIAGNOSIS — M25511 Pain in right shoulder: Secondary | ICD-10-CM | POA: Diagnosis not present

## 2015-05-21 ENCOUNTER — Ambulatory Visit (HOSPITAL_COMMUNITY): Payer: Self-pay | Admitting: Psychiatry

## 2015-05-22 DIAGNOSIS — M25511 Pain in right shoulder: Secondary | ICD-10-CM | POA: Diagnosis not present

## 2015-05-26 DIAGNOSIS — M25511 Pain in right shoulder: Secondary | ICD-10-CM | POA: Diagnosis not present

## 2015-05-27 DIAGNOSIS — M25511 Pain in right shoulder: Secondary | ICD-10-CM | POA: Diagnosis not present

## 2015-05-28 DIAGNOSIS — E785 Hyperlipidemia, unspecified: Secondary | ICD-10-CM | POA: Diagnosis not present

## 2015-05-28 DIAGNOSIS — I10 Essential (primary) hypertension: Secondary | ICD-10-CM | POA: Diagnosis not present

## 2015-05-28 DIAGNOSIS — R55 Syncope and collapse: Secondary | ICD-10-CM | POA: Diagnosis not present

## 2015-05-29 DIAGNOSIS — Z85820 Personal history of malignant melanoma of skin: Secondary | ICD-10-CM | POA: Diagnosis not present

## 2015-05-29 DIAGNOSIS — D225 Melanocytic nevi of trunk: Secondary | ICD-10-CM | POA: Diagnosis not present

## 2015-05-29 DIAGNOSIS — L821 Other seborrheic keratosis: Secondary | ICD-10-CM | POA: Diagnosis not present

## 2015-05-29 DIAGNOSIS — B079 Viral wart, unspecified: Secondary | ICD-10-CM | POA: Diagnosis not present

## 2015-05-30 DIAGNOSIS — M109 Gout, unspecified: Secondary | ICD-10-CM | POA: Diagnosis not present

## 2015-05-30 DIAGNOSIS — F329 Major depressive disorder, single episode, unspecified: Secondary | ICD-10-CM | POA: Diagnosis not present

## 2015-05-30 DIAGNOSIS — E7800 Pure hypercholesterolemia, unspecified: Secondary | ICD-10-CM | POA: Diagnosis not present

## 2015-05-30 DIAGNOSIS — Z883 Allergy status to other anti-infective agents status: Secondary | ICD-10-CM | POA: Diagnosis not present

## 2015-05-30 DIAGNOSIS — K219 Gastro-esophageal reflux disease without esophagitis: Secondary | ICD-10-CM | POA: Diagnosis not present

## 2015-05-30 DIAGNOSIS — I10 Essential (primary) hypertension: Secondary | ICD-10-CM | POA: Diagnosis not present

## 2015-05-30 DIAGNOSIS — M25511 Pain in right shoulder: Secondary | ICD-10-CM | POA: Diagnosis not present

## 2015-05-30 DIAGNOSIS — K4090 Unilateral inguinal hernia, without obstruction or gangrene, not specified as recurrent: Secondary | ICD-10-CM | POA: Diagnosis not present

## 2015-05-30 DIAGNOSIS — Z87891 Personal history of nicotine dependence: Secondary | ICD-10-CM | POA: Diagnosis not present

## 2015-06-02 DIAGNOSIS — E7800 Pure hypercholesterolemia, unspecified: Secondary | ICD-10-CM | POA: Diagnosis not present

## 2015-06-02 DIAGNOSIS — F329 Major depressive disorder, single episode, unspecified: Secondary | ICD-10-CM | POA: Diagnosis not present

## 2015-06-02 DIAGNOSIS — I10 Essential (primary) hypertension: Secondary | ICD-10-CM | POA: Diagnosis not present

## 2015-06-02 DIAGNOSIS — Z87891 Personal history of nicotine dependence: Secondary | ICD-10-CM | POA: Diagnosis not present

## 2015-06-02 DIAGNOSIS — K4090 Unilateral inguinal hernia, without obstruction or gangrene, not specified as recurrent: Secondary | ICD-10-CM | POA: Diagnosis not present

## 2015-06-02 DIAGNOSIS — M109 Gout, unspecified: Secondary | ICD-10-CM | POA: Diagnosis not present

## 2015-06-10 DIAGNOSIS — M25511 Pain in right shoulder: Secondary | ICD-10-CM | POA: Diagnosis not present

## 2015-06-11 DIAGNOSIS — M25511 Pain in right shoulder: Secondary | ICD-10-CM | POA: Diagnosis not present

## 2015-06-13 DIAGNOSIS — M25511 Pain in right shoulder: Secondary | ICD-10-CM | POA: Diagnosis not present

## 2015-06-16 DIAGNOSIS — M25511 Pain in right shoulder: Secondary | ICD-10-CM | POA: Diagnosis not present

## 2015-06-18 DIAGNOSIS — M25511 Pain in right shoulder: Secondary | ICD-10-CM | POA: Diagnosis not present

## 2015-06-20 DIAGNOSIS — M25511 Pain in right shoulder: Secondary | ICD-10-CM | POA: Diagnosis not present

## 2015-06-23 DIAGNOSIS — M25511 Pain in right shoulder: Secondary | ICD-10-CM | POA: Diagnosis not present

## 2015-06-26 ENCOUNTER — Other Ambulatory Visit (HOSPITAL_COMMUNITY): Payer: Self-pay | Admitting: Psychiatry

## 2015-06-27 DIAGNOSIS — M25511 Pain in right shoulder: Secondary | ICD-10-CM | POA: Diagnosis not present

## 2015-07-04 DIAGNOSIS — G5762 Lesion of plantar nerve, left lower limb: Secondary | ICD-10-CM | POA: Diagnosis not present

## 2015-07-04 DIAGNOSIS — M6530 Trigger finger, unspecified finger: Secondary | ICD-10-CM | POA: Diagnosis not present

## 2015-08-12 DIAGNOSIS — N400 Enlarged prostate without lower urinary tract symptoms: Secondary | ICD-10-CM | POA: Diagnosis not present

## 2015-08-22 DIAGNOSIS — M26629 Arthralgia of temporomandibular joint, unspecified side: Secondary | ICD-10-CM | POA: Diagnosis not present

## 2015-09-03 DIAGNOSIS — K4090 Unilateral inguinal hernia, without obstruction or gangrene, not specified as recurrent: Secondary | ICD-10-CM | POA: Diagnosis not present

## 2015-09-03 DIAGNOSIS — J301 Allergic rhinitis due to pollen: Secondary | ICD-10-CM | POA: Diagnosis not present

## 2015-09-03 DIAGNOSIS — D649 Anemia, unspecified: Secondary | ICD-10-CM | POA: Diagnosis not present

## 2015-09-03 DIAGNOSIS — M109 Gout, unspecified: Secondary | ICD-10-CM | POA: Diagnosis not present

## 2015-09-03 DIAGNOSIS — N182 Chronic kidney disease, stage 2 (mild): Secondary | ICD-10-CM | POA: Diagnosis not present

## 2015-09-03 DIAGNOSIS — N200 Calculus of kidney: Secondary | ICD-10-CM | POA: Diagnosis not present

## 2015-09-03 DIAGNOSIS — E782 Mixed hyperlipidemia: Secondary | ICD-10-CM | POA: Diagnosis not present

## 2015-09-03 DIAGNOSIS — J324 Chronic pansinusitis: Secondary | ICD-10-CM | POA: Diagnosis not present

## 2015-09-03 DIAGNOSIS — F439 Reaction to severe stress, unspecified: Secondary | ICD-10-CM | POA: Diagnosis not present

## 2015-09-03 DIAGNOSIS — R05 Cough: Secondary | ICD-10-CM | POA: Diagnosis not present

## 2015-09-03 DIAGNOSIS — R0602 Shortness of breath: Secondary | ICD-10-CM | POA: Diagnosis not present

## 2015-09-03 DIAGNOSIS — J449 Chronic obstructive pulmonary disease, unspecified: Secondary | ICD-10-CM | POA: Diagnosis not present

## 2015-09-05 ENCOUNTER — Other Ambulatory Visit (HOSPITAL_COMMUNITY): Payer: Self-pay | Admitting: Psychiatry

## 2015-09-05 DIAGNOSIS — R0602 Shortness of breath: Secondary | ICD-10-CM | POA: Diagnosis not present

## 2015-09-05 DIAGNOSIS — E785 Hyperlipidemia, unspecified: Secondary | ICD-10-CM | POA: Diagnosis not present

## 2015-09-08 ENCOUNTER — Telehealth (HOSPITAL_COMMUNITY): Payer: Self-pay

## 2015-09-08 ENCOUNTER — Other Ambulatory Visit (HOSPITAL_COMMUNITY): Payer: Self-pay | Admitting: Psychiatry

## 2015-09-08 NOTE — Telephone Encounter (Signed)
Needs appointment

## 2015-09-08 NOTE — Telephone Encounter (Signed)
Called patients pharmacy and let them know.

## 2015-09-08 NOTE — Telephone Encounter (Signed)
Fax received from patients pharmacy refill on Desyrel 100 mg - patient was last seen 2/27 and does not have a follow up appointment, please review and advise, thank you

## 2015-09-10 ENCOUNTER — Telehealth (HOSPITAL_COMMUNITY): Payer: Self-pay

## 2015-09-10 DIAGNOSIS — F329 Major depressive disorder, single episode, unspecified: Secondary | ICD-10-CM

## 2015-09-10 DIAGNOSIS — F32A Depression, unspecified: Secondary | ICD-10-CM

## 2015-09-10 MED ORDER — LAMOTRIGINE 150 MG PO TABS: 150.0000 mg | ORAL_TABLET | Freq: Every day | ORAL | Status: DC

## 2015-09-10 MED ORDER — TRAZODONE HCL 100 MG PO TABS: 200.0000 mg | ORAL_TABLET | Freq: Every day | ORAL | Status: DC

## 2015-09-10 MED ORDER — BUPROPION HCL ER (XL) 150 MG PO TB24: ORAL_TABLET | ORAL | Status: DC

## 2015-09-10 NOTE — Telephone Encounter (Signed)
Patient called back and has made a follow up appointment for 6/29, however he is out of his medications and would like to know if he can get enough until his appointment. Please review and advise, thank you.

## 2015-09-10 NOTE — Telephone Encounter (Signed)
We can provide 30 day supply medication.

## 2015-09-10 NOTE — Telephone Encounter (Signed)
30 day supply of Wellbutrin, Lamictal, and Trazodone were sent to the pharmacy

## 2015-09-18 ENCOUNTER — Encounter (HOSPITAL_COMMUNITY): Payer: Self-pay | Admitting: Psychiatry

## 2015-09-18 ENCOUNTER — Ambulatory Visit (INDEPENDENT_AMBULATORY_CARE_PROVIDER_SITE_OTHER): Payer: Medicare Other | Admitting: Psychiatry

## 2015-09-18 VITALS — BP 122/68 | HR 67 | Ht 69.0 in | Wt 145.6 lb

## 2015-09-18 DIAGNOSIS — F329 Major depressive disorder, single episode, unspecified: Secondary | ICD-10-CM | POA: Diagnosis not present

## 2015-09-18 DIAGNOSIS — F32A Depression, unspecified: Secondary | ICD-10-CM

## 2015-09-18 MED ORDER — BUPROPION HCL ER (XL) 150 MG PO TB24: ORAL_TABLET | ORAL | Status: DC

## 2015-09-18 MED ORDER — LAMOTRIGINE 150 MG PO TABS: 150.0000 mg | ORAL_TABLET | Freq: Every day | ORAL | Status: DC

## 2015-09-18 MED ORDER — TRAZODONE HCL 100 MG PO TABS: 200.0000 mg | ORAL_TABLET | Freq: Every day | ORAL | Status: DC

## 2015-09-18 NOTE — Progress Notes (Signed)
[facility] [phone] Progress Note  Colin Galvan [id number] 70 y.o.  09/18/2015 2:33 PM  Chief Complaint: Medication management and follow-up.                   History of Present Illness:   Colin Galvan came for his follow-up appointment.  He is taking his medication as prescribed.  He reported much improvement since the Lamictal increase few months ago.  He is not irritable, angry or depressed.  He reported a good relationship with his wife.  He admitted cognitive impairment and sometime he has difficulty finding directions .  He recently saw his primary care physician and there has been no new medications.  He is taking blood pressure medication .  Patient denies any agitation, anger, mood swing.  He denies any hallucination or any paranoia.  He is concerned about his weight and despite a good appetite he continued to lose 3 pounds from his last visit.  His energy level is good.  His vital signs are stable.  He denies any feeling of hopelessness or any worthlessness.  He has no tremors shakes or any EPS.  He has no rash or itching.  He lives with his wife.  Suicidal Ideation: No Plan Formed: No Patient has means to carry out plan: No  Homicidal Ideation: No Plan Formed: No Patient has means to carry out plan: No  Review of Systems  Constitutional: Negative.   Cardiovascular: Negative.   Skin: Negative for itching and rash.  Neurological: Negative for headaches.  Psychiatric/Behavioral:       Mild Cognitive impairment   Psychiatric: Agitation: No Hallucination: No Depressed Mood: No Insomnia: No Hypersomnia: No Altered Concentration: No Feels Worthless: No Grandiose Ideas: No Belief In Special Powers: No New/Increased Substance Abuse: No Compulsions: No  Neurologic: Headache: No Seizure: No Paresthesias: No  Medical History;  Patient has GERD, gout, history of melanoma and hypertension.  His primary care physician is Dr. [name] who is also a close  friend of the patient.  His cardiologist is Raechel Chute in [place].  Outpatient Encounter Prescriptions as of 09/18/2015  Medication Sig  . triamterene-hydrochlorothiazide (MAXZIDE-25) 37.5-25 MG tablet   . allopurinol (ZYLOPRIM) 300 MG tablet Take 300 mg by mouth daily.    Marland Kitchen amLODipine (NORVASC) 5 MG tablet   . aspirin 81 MG tablet Take 81 mg by mouth daily.    Marland Kitchen buPROPion (WELLBUTRIN XL) 150 MG 24 hr tablet TAKE ONE TABLET BY MOUTH ONCE DAILY IN THE MORNING  . carvedilol (COREG) 6.25 MG tablet   . Cetirizine HCl 10 MG CAPS Take by mouth.  . lamoTRIgine (LAMICTAL) 150 MG tablet Take 1 tablet (150 mg total) by mouth daily.  Marland Kitchen omeprazole (PRILOSEC) 20 MG capsule   . simvastatin (ZOCOR) 40 MG tablet Take 40 mg by mouth daily.  . traZODone (DESYREL) 100 MG tablet Take 2 tablets (200 mg total) by mouth at bedtime.  . Vitamin D, Ergocalciferol, (DRISDOL) 50000 UNITS CAPS capsule Take by mouth.  . [DISCONTINUED] buPROPion (WELLBUTRIN XL) 150 MG 24 hr tablet TAKE ONE TABLET BY MOUTH ONCE DAILY IN THE MORNING  . [DISCONTINUED] IPRATROPIUM BROMIDE NA Place 2 sprays into the nose daily.  . [DISCONTINUED] lamoTRIgine (LAMICTAL) 150 MG tablet Take 1 tablet (150 mg total) by mouth daily.  . [DISCONTINUED] losartan (COZAAR) 100 MG tablet Take 100 mg by mouth daily.  . [DISCONTINUED] traZODone (DESYREL) 100 MG tablet Take 2 tablets (200 mg total) by mouth at bedtime.  No facility-administered encounter medications on file as of 09/18/2015.   No results found for this or any previous visit (from the past 2160 hour(s)).   Past Psychiatric History/Hospitalization(s)  Patient has history of depression for almost 20 years.  In the past he has tried Cymbalta, Klonopin and Prestiq.  Patient denies any history of mania or psychosis.  He denies any history of psychiatric inpatient treatment or any suicidal attempt. Anxiety: Yes Bipolar Disorder: No Depression: Yes Mania: No Psychosis: No Schizophrenia:  No Personality Disorder: No Hospitalization for psychiatric illness: No History of Electroconvulsive Shock Therapy: No Prior Suicide Attempts: No  Physical Exam: Constitutional:  BP 122/68 mmHg  Pulse 67  Ht 5\' 9"  (1.753 m)  Wt 145 lb 9.6 oz (66.044 kg)  BMI 21.49 kg/m2  Musculoskeletal: Strength & Muscle Tone: within normal limits Gait & Station: normal Patient leans: N/A  Mental Status Examination;  Patient is well dressed and well groomed man.  He is pleasant and cooperative.  His speech is slow, clear and coherent.  His attention and concentration is fair.  He has some difficulty remembering things but he denies any active or passive suicidal thoughts or homicidal thought.  His fund of knowledge is good. His thought process is slow but logical.  There were no paranoia or delusions present at this time.  He described his mood is euthymic and his affect is mood appropriate.  There were no tremors or shakes.  He is alert and oriented x3.  His insight judgment and impulse control is okay.  Established Problem, Stable/Improving (1), Review of Last Therapy Session (1) and Review of Medication Regimen & Side Effects (2)  Assessment: Axis I: Depressive disorder NOS, rule out bipolar disorder depressed type  Axis II: Deferred  Axis III: see medical history  Plan:  Patient Is a stable on his current psychiatric meds.  He has no concern or side effects.  I will continue Lamictal 150 mg daily, trazodone 200 mg at bedtime and Wellbutrin XL 150 mg daily.  Discussed medication side effects and benefits.  Recommended to call us back if he has any question or any concern.  Follow-up in 6 months.   Avice Funchess T., MD 09/18/2015

## 2015-10-03 DIAGNOSIS — N183 Chronic kidney disease, stage 3 (moderate): Secondary | ICD-10-CM | POA: Diagnosis not present

## 2015-10-03 DIAGNOSIS — M1990 Unspecified osteoarthritis, unspecified site: Secondary | ICD-10-CM | POA: Diagnosis not present

## 2015-10-03 DIAGNOSIS — I1310 Hypertensive heart and chronic kidney disease without heart failure, with stage 1 through stage 4 chronic kidney disease, or unspecified chronic kidney disease: Secondary | ICD-10-CM | POA: Diagnosis not present

## 2015-10-09 DIAGNOSIS — G5762 Lesion of plantar nerve, left lower limb: Secondary | ICD-10-CM | POA: Diagnosis not present

## 2015-10-15 DIAGNOSIS — N183 Chronic kidney disease, stage 3 (moderate): Secondary | ICD-10-CM | POA: Diagnosis not present

## 2015-10-15 DIAGNOSIS — J0100 Acute maxillary sinusitis, unspecified: Secondary | ICD-10-CM | POA: Diagnosis not present

## 2015-10-15 DIAGNOSIS — J342 Deviated nasal septum: Secondary | ICD-10-CM | POA: Diagnosis not present

## 2015-10-15 DIAGNOSIS — J301 Allergic rhinitis due to pollen: Secondary | ICD-10-CM | POA: Diagnosis not present

## 2015-11-20 DIAGNOSIS — M7541 Impingement syndrome of right shoulder: Secondary | ICD-10-CM | POA: Diagnosis not present

## 2015-11-20 DIAGNOSIS — M25511 Pain in right shoulder: Secondary | ICD-10-CM | POA: Diagnosis not present

## 2015-12-05 DIAGNOSIS — L820 Inflamed seborrheic keratosis: Secondary | ICD-10-CM | POA: Diagnosis not present

## 2015-12-05 DIAGNOSIS — D485 Neoplasm of uncertain behavior of skin: Secondary | ICD-10-CM | POA: Diagnosis not present

## 2015-12-05 DIAGNOSIS — D225 Melanocytic nevi of trunk: Secondary | ICD-10-CM | POA: Diagnosis not present

## 2015-12-05 DIAGNOSIS — L570 Actinic keratosis: Secondary | ICD-10-CM | POA: Diagnosis not present

## 2015-12-05 DIAGNOSIS — Z85828 Personal history of other malignant neoplasm of skin: Secondary | ICD-10-CM | POA: Diagnosis not present

## 2015-12-08 DIAGNOSIS — I10 Essential (primary) hypertension: Secondary | ICD-10-CM | POA: Diagnosis not present

## 2015-12-08 DIAGNOSIS — R42 Dizziness and giddiness: Secondary | ICD-10-CM | POA: Diagnosis not present

## 2015-12-08 DIAGNOSIS — E785 Hyperlipidemia, unspecified: Secondary | ICD-10-CM | POA: Diagnosis not present

## 2015-12-08 DIAGNOSIS — R55 Syncope and collapse: Secondary | ICD-10-CM | POA: Diagnosis not present

## 2015-12-22 DIAGNOSIS — I10 Essential (primary) hypertension: Secondary | ICD-10-CM | POA: Diagnosis not present

## 2015-12-22 DIAGNOSIS — R55 Syncope and collapse: Secondary | ICD-10-CM | POA: Diagnosis not present

## 2015-12-22 DIAGNOSIS — R42 Dizziness and giddiness: Secondary | ICD-10-CM | POA: Diagnosis not present

## 2015-12-24 DIAGNOSIS — F439 Reaction to severe stress, unspecified: Secondary | ICD-10-CM | POA: Diagnosis not present

## 2015-12-24 DIAGNOSIS — J301 Allergic rhinitis due to pollen: Secondary | ICD-10-CM | POA: Diagnosis not present

## 2015-12-24 DIAGNOSIS — R5383 Other fatigue: Secondary | ICD-10-CM | POA: Diagnosis not present

## 2015-12-24 DIAGNOSIS — J449 Chronic obstructive pulmonary disease, unspecified: Secondary | ICD-10-CM | POA: Diagnosis not present

## 2015-12-24 DIAGNOSIS — E785 Hyperlipidemia, unspecified: Secondary | ICD-10-CM | POA: Diagnosis not present

## 2015-12-24 DIAGNOSIS — N182 Chronic kidney disease, stage 2 (mild): Secondary | ICD-10-CM | POA: Diagnosis not present

## 2015-12-24 DIAGNOSIS — R05 Cough: Secondary | ICD-10-CM | POA: Diagnosis not present

## 2015-12-24 DIAGNOSIS — N200 Calculus of kidney: Secondary | ICD-10-CM | POA: Diagnosis not present

## 2015-12-24 DIAGNOSIS — J309 Allergic rhinitis, unspecified: Secondary | ICD-10-CM | POA: Diagnosis not present

## 2016-01-01 DIAGNOSIS — R3912 Poor urinary stream: Secondary | ICD-10-CM | POA: Diagnosis not present

## 2016-01-01 DIAGNOSIS — J040 Acute laryngitis: Secondary | ICD-10-CM | POA: Diagnosis not present

## 2016-01-01 DIAGNOSIS — N401 Enlarged prostate with lower urinary tract symptoms: Secondary | ICD-10-CM | POA: Diagnosis not present

## 2016-01-27 DIAGNOSIS — M6530 Trigger finger, unspecified finger: Secondary | ICD-10-CM | POA: Diagnosis not present

## 2016-01-27 DIAGNOSIS — M67919 Unspecified disorder of synovium and tendon, unspecified shoulder: Secondary | ICD-10-CM | POA: Diagnosis not present

## 2016-01-27 DIAGNOSIS — M719 Bursopathy, unspecified: Secondary | ICD-10-CM | POA: Diagnosis not present

## 2016-02-06 DIAGNOSIS — R3912 Poor urinary stream: Secondary | ICD-10-CM | POA: Diagnosis not present

## 2016-02-06 DIAGNOSIS — N401 Enlarged prostate with lower urinary tract symptoms: Secondary | ICD-10-CM | POA: Diagnosis not present

## 2016-02-10 DIAGNOSIS — M75111 Incomplete rotator cuff tear or rupture of right shoulder, not specified as traumatic: Secondary | ICD-10-CM | POA: Diagnosis not present

## 2016-02-17 DIAGNOSIS — R55 Syncope and collapse: Secondary | ICD-10-CM | POA: Diagnosis not present

## 2016-02-17 DIAGNOSIS — I10 Essential (primary) hypertension: Secondary | ICD-10-CM | POA: Diagnosis not present

## 2016-02-17 DIAGNOSIS — R0602 Shortness of breath: Secondary | ICD-10-CM | POA: Diagnosis not present

## 2016-02-17 DIAGNOSIS — E785 Hyperlipidemia, unspecified: Secondary | ICD-10-CM | POA: Diagnosis not present

## 2016-02-17 DIAGNOSIS — R42 Dizziness and giddiness: Secondary | ICD-10-CM | POA: Diagnosis not present

## 2016-02-20 HISTORY — PX: ROTATOR CUFF REPAIR: SHX139

## 2016-02-23 DIAGNOSIS — M65312 Trigger thumb, left thumb: Secondary | ICD-10-CM | POA: Diagnosis not present

## 2016-02-24 ENCOUNTER — Encounter (HOSPITAL_COMMUNITY): Payer: Self-pay | Admitting: Psychiatry

## 2016-02-24 ENCOUNTER — Ambulatory Visit (INDEPENDENT_AMBULATORY_CARE_PROVIDER_SITE_OTHER): Payer: Medicare Other | Admitting: Psychiatry

## 2016-02-24 VITALS — BP 110/70 | HR 85 | Ht 69.0 in | Wt 149.8 lb

## 2016-02-24 DIAGNOSIS — F331 Major depressive disorder, recurrent, moderate: Secondary | ICD-10-CM

## 2016-02-24 MED ORDER — LAMOTRIGINE 200 MG PO TABS: 200.0000 mg | ORAL_TABLET | Freq: Every day | ORAL | 1 refills | Status: DC

## 2016-02-24 MED ORDER — TRAZODONE HCL 100 MG PO TABS: 200.0000 mg | ORAL_TABLET | Freq: Every day | ORAL | 1 refills | Status: DC

## 2016-02-24 MED ORDER — BUPROPION HCL ER (XL) 150 MG PO TB24: ORAL_TABLET | ORAL | 1 refills | Status: DC

## 2016-02-24 NOTE — Progress Notes (Signed)
[facility] [phone] Progress Note  Colin Galvan [id number] 70 y.o.  02/24/2016 10:46 AM  Chief Complaint: I have a lot of racing thoughts.  I am stressed because my wife have a lot of health issues.                     History of Present Illness:   Colin Galvan came for his follow-up appointment.  He is feeling more anxious and nervous in recent weeks.  He endorsed that his wife has gallbladder surgery and also upcoming knee surgery in January.  He is taking care of his wife and sometime he feels overwhelmed and stressed out.  He is trying to be a good husband but now he is also concerned about his own future and age.  He admitted sometime feeling tired and having no motivation.  He admitted having racing thoughts and obsessive thinking in the night and sometime he has difficulty falling asleep.  He's taking his medication and reported no side effects.  He has no rash, itching or any tremors.  He has not done any volunteer work because he is taking care of his wife.  Patient denies any mania, psychosis, agitation, hallucination or any suicidal thinking.  His energy level is fair.  His appetite is okay.  His vital signs are stable.  He endorsed his wife has not seen therapist in a while due to health reasons but he thinks that she needed to be seen once she gets recover from her surgery.  Suicidal Ideation: No Plan Formed: No Patient has means to carry out plan: No  Homicidal Ideation: No Plan Formed: No Patient has means to carry out plan: No  Review of Systems  Constitutional: Negative.   HENT: Negative.   Eyes: Negative.   Respiratory: Negative.   Cardiovascular: Negative.   Gastrointestinal: Negative.   Genitourinary: Negative.   Musculoskeletal: Negative.   Skin: Negative.  Negative for itching and rash.  Neurological: Negative.   Psychiatric/Behavioral: The patient is nervous/anxious.        Mild Cognitive impairment   Neurologic: Headache: No Seizure:  No Paresthesias: No  Medical History;  Patient has GERD, gout, history of melanoma and hypertension.  His primary care physician is Dr. [name] who is also a close friend of the patient.  His cardiologist is Raechel Chute in [place].  Outpatient Encounter Prescriptions as of 02/24/2016  Medication Sig Dispense Refill  . allopurinol (ZYLOPRIM) 300 MG tablet Take 300 mg by mouth daily.      Marland Kitchen amLODipine (NORVASC) 5 MG tablet     . buPROPion (WELLBUTRIN XL) 150 MG 24 hr tablet TAKE ONE TABLET BY MOUTH ONCE DAILY IN THE MORNING 90 tablet 1  . carvedilol (COREG) 6.25 MG tablet     . Cetirizine HCl 10 MG CAPS Take by mouth.    . lamoTRIgine (LAMICTAL) 200 MG tablet Take 1 tablet (200 mg total) by mouth daily. 90 tablet 1  . omeprazole (PRILOSEC) 20 MG capsule     . simvastatin (ZOCOR) 40 MG tablet Take 40 mg by mouth daily.    . traZODone (DESYREL) 100 MG tablet Take 2 tablets (200 mg total) by mouth at bedtime. 180 tablet 1  . triamterene-hydrochlorothiazide (MAXZIDE-25) 37.5-25 MG tablet     . Vitamin D, Ergocalciferol, (DRISDOL) 50000 UNITS CAPS capsule Take by mouth.    . [DISCONTINUED] buPROPion (WELLBUTRIN XL) 150 MG 24 hr tablet TAKE ONE TABLET BY MOUTH ONCE DAILY IN THE MORNING  90 tablet 1  . [DISCONTINUED] lamoTRIgine (LAMICTAL) 150 MG tablet Take 1 tablet (150 mg total) by mouth daily. 90 tablet 1  . [DISCONTINUED] traZODone (DESYREL) 100 MG tablet Take 2 tablets (200 mg total) by mouth at bedtime. 180 tablet 1  . aspirin 81 MG tablet Take 81 mg by mouth daily.       No facility-administered encounter medications on file as of 02/24/2016.    No results found for this or any previous visit (from the past 2160 hour(s)).   Past Psychiatric History/Hospitalization(s)  Patient has history of depression for almost 20 years.  In the past he has tried Cymbalta, Klonopin and Prestiq.  Patient denies any history of mania or psychosis.  He denies any history of psychiatric inpatient  treatment or any suicidal attempt. Anxiety: Yes Bipolar Disorder: No Depression: Yes Mania: No Psychosis: No Schizophrenia: No Personality Disorder: No Hospitalization for psychiatric illness: No History of Electroconvulsive Shock Therapy: No Prior Suicide Attempts: No  Physical Exam: Constitutional:  BP 110/70   Pulse 85   Ht 5\' 9"  (1.753 m)   Wt 149 lb 12.8 oz (67.9 kg)   BMI 22.12 kg/m   Mental Status Examination;  Patient is well dressed well groomed man who is pleasant and cooperative.  He maintained good eye contact.  His his speech is slow but clear, coherent with normal tone and volume.  He described his mood anxious and nervous and his affect is appropriate.  There were no delusions, paranoia or any obsessive thoughts.  His attention and concentration is fair.  He has some difficulty remembering things but he was alert and oriented 3.  Denies any active or passive suicidal thoughts or homicidal thought.  His psychomotor activity is slow.  There were no flight of ideas or any loose association.  His insight judgment and impulse control is okay.  Review of Psycho-Social Stressors (1), Established Problem, Worsening (2), Review of Last Therapy Session (1), Review of Medication Regimen & Side Effects (2) and Review of New Medication or Change in Dosage (2)  Assessment: Axis I: Moderate episode of recurrent major depressive disorder (HCC) - Plan: traZODone (DESYREL) 100 MG tablet, lamoTRIgine (LAMICTAL) 200 MG tablet, buPROPion (WELLBUTRIN XL) 150 MG 24 hr tablet  Plan:  I discuss psychosocial stressors, current medication.  Patient is taking Lamictal 150 mg, trazodone 200 mg at bedtime and Wellbutrin XL 150 mg daily.  I recommended to increase Lamictal 200 mg daily to help his residual anxiety and depressive symptoms.  One more time I also offer to see counselor but patient declined due to his busy schedule.  Patient is scheduled to see his primary care physician in few weeks and  I recommended to have his blood work faxed to [place].Discussed medication side effects and benefits.  Recommended to call us back if there is any question, concern or worsening of the symptoms.  Discuss safety plan that anytime having active suicidal thoughts or homicidal thoughts and she need to call 911 or go to the local emergency room follow-up in 6 months.  Costella Schwarz T., MD 02/24/2016

## 2016-02-26 DIAGNOSIS — N486 Induration penis plastica: Secondary | ICD-10-CM | POA: Diagnosis not present

## 2016-02-26 DIAGNOSIS — N400 Enlarged prostate without lower urinary tract symptoms: Secondary | ICD-10-CM | POA: Diagnosis not present

## 2016-03-01 ENCOUNTER — Ambulatory Visit (HOSPITAL_COMMUNITY): Payer: Self-pay | Admitting: Psychiatry

## 2016-03-03 DIAGNOSIS — M7541 Impingement syndrome of right shoulder: Secondary | ICD-10-CM | POA: Diagnosis not present

## 2016-03-03 DIAGNOSIS — Z888 Allergy status to other drugs, medicaments and biological substances status: Secondary | ICD-10-CM | POA: Diagnosis not present

## 2016-03-03 DIAGNOSIS — M25711 Osteophyte, right shoulder: Secondary | ICD-10-CM | POA: Diagnosis not present

## 2016-03-03 DIAGNOSIS — M25511 Pain in right shoulder: Secondary | ICD-10-CM | POA: Diagnosis not present

## 2016-03-03 DIAGNOSIS — C439 Malignant melanoma of skin, unspecified: Secondary | ICD-10-CM | POA: Diagnosis not present

## 2016-03-03 DIAGNOSIS — G8918 Other acute postprocedural pain: Secondary | ICD-10-CM | POA: Diagnosis not present

## 2016-03-03 DIAGNOSIS — M7501 Adhesive capsulitis of right shoulder: Secondary | ICD-10-CM | POA: Diagnosis not present

## 2016-03-03 DIAGNOSIS — Z01818 Encounter for other preprocedural examination: Secondary | ICD-10-CM | POA: Diagnosis not present

## 2016-03-03 DIAGNOSIS — M19011 Primary osteoarthritis, right shoulder: Secondary | ICD-10-CM | POA: Diagnosis not present

## 2016-03-03 DIAGNOSIS — K219 Gastro-esophageal reflux disease without esophagitis: Secondary | ICD-10-CM | POA: Diagnosis not present

## 2016-03-03 DIAGNOSIS — M75111 Incomplete rotator cuff tear or rupture of right shoulder, not specified as traumatic: Secondary | ICD-10-CM | POA: Diagnosis not present

## 2016-03-03 DIAGNOSIS — M7521 Bicipital tendinitis, right shoulder: Secondary | ICD-10-CM | POA: Diagnosis not present

## 2016-03-03 DIAGNOSIS — E785 Hyperlipidemia, unspecified: Secondary | ICD-10-CM | POA: Diagnosis not present

## 2016-03-03 DIAGNOSIS — S46811A Strain of other muscles, fascia and tendons at shoulder and upper arm level, right arm, initial encounter: Secondary | ICD-10-CM | POA: Diagnosis not present

## 2016-03-03 DIAGNOSIS — I10 Essential (primary) hypertension: Secondary | ICD-10-CM | POA: Diagnosis not present

## 2016-03-03 DIAGNOSIS — I499 Cardiac arrhythmia, unspecified: Secondary | ICD-10-CM | POA: Diagnosis not present

## 2016-03-03 DIAGNOSIS — M66311 Spontaneous rupture of flexor tendons, right shoulder: Secondary | ICD-10-CM | POA: Diagnosis not present

## 2016-03-03 DIAGNOSIS — M109 Gout, unspecified: Secondary | ICD-10-CM | POA: Diagnosis not present

## 2016-03-08 DIAGNOSIS — M7501 Adhesive capsulitis of right shoulder: Secondary | ICD-10-CM | POA: Diagnosis not present

## 2016-03-08 DIAGNOSIS — M25511 Pain in right shoulder: Secondary | ICD-10-CM | POA: Diagnosis not present

## 2016-03-08 DIAGNOSIS — Z4789 Encounter for other orthopedic aftercare: Secondary | ICD-10-CM | POA: Diagnosis not present

## 2016-03-10 DIAGNOSIS — Z4789 Encounter for other orthopedic aftercare: Secondary | ICD-10-CM | POA: Diagnosis not present

## 2016-03-10 DIAGNOSIS — M25511 Pain in right shoulder: Secondary | ICD-10-CM | POA: Diagnosis not present

## 2016-03-10 DIAGNOSIS — M7501 Adhesive capsulitis of right shoulder: Secondary | ICD-10-CM | POA: Diagnosis not present

## 2016-03-12 DIAGNOSIS — Z4789 Encounter for other orthopedic aftercare: Secondary | ICD-10-CM | POA: Diagnosis not present

## 2016-03-12 DIAGNOSIS — M25511 Pain in right shoulder: Secondary | ICD-10-CM | POA: Diagnosis not present

## 2016-03-12 DIAGNOSIS — M7501 Adhesive capsulitis of right shoulder: Secondary | ICD-10-CM | POA: Diagnosis not present

## 2016-03-17 DIAGNOSIS — M7501 Adhesive capsulitis of right shoulder: Secondary | ICD-10-CM | POA: Diagnosis not present

## 2016-03-17 DIAGNOSIS — M25511 Pain in right shoulder: Secondary | ICD-10-CM | POA: Diagnosis not present

## 2016-03-17 DIAGNOSIS — M79631 Pain in right forearm: Secondary | ICD-10-CM | POA: Diagnosis not present

## 2016-03-17 DIAGNOSIS — Z4789 Encounter for other orthopedic aftercare: Secondary | ICD-10-CM | POA: Diagnosis not present

## 2016-03-19 DIAGNOSIS — M7501 Adhesive capsulitis of right shoulder: Secondary | ICD-10-CM | POA: Diagnosis not present

## 2016-03-19 DIAGNOSIS — M25511 Pain in right shoulder: Secondary | ICD-10-CM | POA: Diagnosis not present

## 2016-03-19 DIAGNOSIS — Z4789 Encounter for other orthopedic aftercare: Secondary | ICD-10-CM | POA: Diagnosis not present

## 2016-03-24 DIAGNOSIS — M25511 Pain in right shoulder: Secondary | ICD-10-CM | POA: Diagnosis not present

## 2016-03-24 DIAGNOSIS — Z4789 Encounter for other orthopedic aftercare: Secondary | ICD-10-CM | POA: Diagnosis not present

## 2016-03-24 DIAGNOSIS — M7501 Adhesive capsulitis of right shoulder: Secondary | ICD-10-CM | POA: Diagnosis not present

## 2016-03-26 DIAGNOSIS — Z4789 Encounter for other orthopedic aftercare: Secondary | ICD-10-CM | POA: Diagnosis not present

## 2016-03-26 DIAGNOSIS — M7501 Adhesive capsulitis of right shoulder: Secondary | ICD-10-CM | POA: Diagnosis not present

## 2016-03-26 DIAGNOSIS — M25511 Pain in right shoulder: Secondary | ICD-10-CM | POA: Diagnosis not present

## 2016-03-31 DIAGNOSIS — M25511 Pain in right shoulder: Secondary | ICD-10-CM | POA: Diagnosis not present

## 2016-03-31 DIAGNOSIS — M7501 Adhesive capsulitis of right shoulder: Secondary | ICD-10-CM | POA: Diagnosis not present

## 2016-03-31 DIAGNOSIS — Z4789 Encounter for other orthopedic aftercare: Secondary | ICD-10-CM | POA: Diagnosis not present

## 2016-03-31 DIAGNOSIS — Z23 Encounter for immunization: Secondary | ICD-10-CM | POA: Diagnosis not present

## 2016-04-02 DIAGNOSIS — M7501 Adhesive capsulitis of right shoulder: Secondary | ICD-10-CM | POA: Diagnosis not present

## 2016-04-02 DIAGNOSIS — M25511 Pain in right shoulder: Secondary | ICD-10-CM | POA: Diagnosis not present

## 2016-04-02 DIAGNOSIS — Z4789 Encounter for other orthopedic aftercare: Secondary | ICD-10-CM | POA: Diagnosis not present

## 2016-04-05 DIAGNOSIS — M7501 Adhesive capsulitis of right shoulder: Secondary | ICD-10-CM | POA: Diagnosis not present

## 2016-04-05 DIAGNOSIS — Z4789 Encounter for other orthopedic aftercare: Secondary | ICD-10-CM | POA: Diagnosis not present

## 2016-04-05 DIAGNOSIS — M25511 Pain in right shoulder: Secondary | ICD-10-CM | POA: Diagnosis not present

## 2016-04-09 DIAGNOSIS — M25511 Pain in right shoulder: Secondary | ICD-10-CM | POA: Diagnosis not present

## 2016-04-09 DIAGNOSIS — Z4789 Encounter for other orthopedic aftercare: Secondary | ICD-10-CM | POA: Diagnosis not present

## 2016-04-09 DIAGNOSIS — M7501 Adhesive capsulitis of right shoulder: Secondary | ICD-10-CM | POA: Diagnosis not present

## 2016-04-13 DIAGNOSIS — M25511 Pain in right shoulder: Secondary | ICD-10-CM | POA: Diagnosis not present

## 2016-04-13 DIAGNOSIS — M7501 Adhesive capsulitis of right shoulder: Secondary | ICD-10-CM | POA: Diagnosis not present

## 2016-04-13 DIAGNOSIS — Z4789 Encounter for other orthopedic aftercare: Secondary | ICD-10-CM | POA: Diagnosis not present

## 2016-04-15 DIAGNOSIS — M7501 Adhesive capsulitis of right shoulder: Secondary | ICD-10-CM | POA: Diagnosis not present

## 2016-04-15 DIAGNOSIS — Z4789 Encounter for other orthopedic aftercare: Secondary | ICD-10-CM | POA: Diagnosis not present

## 2016-04-15 DIAGNOSIS — M25511 Pain in right shoulder: Secondary | ICD-10-CM | POA: Diagnosis not present

## 2016-04-20 DIAGNOSIS — M25511 Pain in right shoulder: Secondary | ICD-10-CM | POA: Diagnosis not present

## 2016-04-20 DIAGNOSIS — Z85820 Personal history of malignant melanoma of skin: Secondary | ICD-10-CM | POA: Diagnosis not present

## 2016-04-20 DIAGNOSIS — Z4789 Encounter for other orthopedic aftercare: Secondary | ICD-10-CM | POA: Diagnosis not present

## 2016-04-20 DIAGNOSIS — L989 Disorder of the skin and subcutaneous tissue, unspecified: Secondary | ICD-10-CM | POA: Diagnosis not present

## 2016-04-20 DIAGNOSIS — M7501 Adhesive capsulitis of right shoulder: Secondary | ICD-10-CM | POA: Diagnosis not present

## 2016-04-22 DIAGNOSIS — Z4789 Encounter for other orthopedic aftercare: Secondary | ICD-10-CM | POA: Diagnosis not present

## 2016-04-22 DIAGNOSIS — M7501 Adhesive capsulitis of right shoulder: Secondary | ICD-10-CM | POA: Diagnosis not present

## 2016-04-22 DIAGNOSIS — M25511 Pain in right shoulder: Secondary | ICD-10-CM | POA: Diagnosis not present

## 2016-04-27 DIAGNOSIS — M7501 Adhesive capsulitis of right shoulder: Secondary | ICD-10-CM | POA: Diagnosis not present

## 2016-04-27 DIAGNOSIS — M25511 Pain in right shoulder: Secondary | ICD-10-CM | POA: Diagnosis not present

## 2016-04-27 DIAGNOSIS — Z4789 Encounter for other orthopedic aftercare: Secondary | ICD-10-CM | POA: Diagnosis not present

## 2016-04-29 DIAGNOSIS — Z4789 Encounter for other orthopedic aftercare: Secondary | ICD-10-CM | POA: Diagnosis not present

## 2016-04-29 DIAGNOSIS — L989 Disorder of the skin and subcutaneous tissue, unspecified: Secondary | ICD-10-CM | POA: Diagnosis not present

## 2016-04-29 DIAGNOSIS — M7501 Adhesive capsulitis of right shoulder: Secondary | ICD-10-CM | POA: Diagnosis not present

## 2016-04-29 DIAGNOSIS — Z85820 Personal history of malignant melanoma of skin: Secondary | ICD-10-CM | POA: Diagnosis not present

## 2016-04-29 DIAGNOSIS — D234 Other benign neoplasm of skin of scalp and neck: Secondary | ICD-10-CM | POA: Diagnosis not present

## 2016-04-29 DIAGNOSIS — L821 Other seborrheic keratosis: Secondary | ICD-10-CM | POA: Diagnosis not present

## 2016-04-29 DIAGNOSIS — M25511 Pain in right shoulder: Secondary | ICD-10-CM | POA: Diagnosis not present

## 2016-04-29 DIAGNOSIS — L820 Inflamed seborrheic keratosis: Secondary | ICD-10-CM | POA: Diagnosis not present

## 2016-05-04 DIAGNOSIS — M7501 Adhesive capsulitis of right shoulder: Secondary | ICD-10-CM | POA: Diagnosis not present

## 2016-05-04 DIAGNOSIS — Z4789 Encounter for other orthopedic aftercare: Secondary | ICD-10-CM | POA: Diagnosis not present

## 2016-05-04 DIAGNOSIS — M25511 Pain in right shoulder: Secondary | ICD-10-CM | POA: Diagnosis not present

## 2016-05-06 DIAGNOSIS — M7501 Adhesive capsulitis of right shoulder: Secondary | ICD-10-CM | POA: Diagnosis not present

## 2016-05-06 DIAGNOSIS — M25511 Pain in right shoulder: Secondary | ICD-10-CM | POA: Diagnosis not present

## 2016-05-06 DIAGNOSIS — Z4789 Encounter for other orthopedic aftercare: Secondary | ICD-10-CM | POA: Diagnosis not present

## 2016-05-11 DIAGNOSIS — M7501 Adhesive capsulitis of right shoulder: Secondary | ICD-10-CM | POA: Diagnosis not present

## 2016-05-11 DIAGNOSIS — M25511 Pain in right shoulder: Secondary | ICD-10-CM | POA: Diagnosis not present

## 2016-05-11 DIAGNOSIS — Z4789 Encounter for other orthopedic aftercare: Secondary | ICD-10-CM | POA: Diagnosis not present

## 2016-05-13 DIAGNOSIS — M7501 Adhesive capsulitis of right shoulder: Secondary | ICD-10-CM | POA: Diagnosis not present

## 2016-05-13 DIAGNOSIS — M25511 Pain in right shoulder: Secondary | ICD-10-CM | POA: Diagnosis not present

## 2016-05-13 DIAGNOSIS — Z4789 Encounter for other orthopedic aftercare: Secondary | ICD-10-CM | POA: Diagnosis not present

## 2016-05-18 DIAGNOSIS — M7501 Adhesive capsulitis of right shoulder: Secondary | ICD-10-CM | POA: Diagnosis not present

## 2016-05-18 DIAGNOSIS — M25511 Pain in right shoulder: Secondary | ICD-10-CM | POA: Diagnosis not present

## 2016-05-18 DIAGNOSIS — Z4789 Encounter for other orthopedic aftercare: Secondary | ICD-10-CM | POA: Diagnosis not present

## 2016-05-25 DIAGNOSIS — M25511 Pain in right shoulder: Secondary | ICD-10-CM | POA: Diagnosis not present

## 2016-05-25 DIAGNOSIS — Z4789 Encounter for other orthopedic aftercare: Secondary | ICD-10-CM | POA: Diagnosis not present

## 2016-05-25 DIAGNOSIS — M7501 Adhesive capsulitis of right shoulder: Secondary | ICD-10-CM | POA: Diagnosis not present

## 2016-06-17 DIAGNOSIS — G5762 Lesion of plantar nerve, left lower limb: Secondary | ICD-10-CM | POA: Diagnosis not present

## 2016-07-05 DIAGNOSIS — L821 Other seborrheic keratosis: Secondary | ICD-10-CM | POA: Diagnosis not present

## 2016-07-05 DIAGNOSIS — D225 Melanocytic nevi of trunk: Secondary | ICD-10-CM | POA: Diagnosis not present

## 2016-07-05 DIAGNOSIS — D485 Neoplasm of uncertain behavior of skin: Secondary | ICD-10-CM | POA: Diagnosis not present

## 2016-07-05 DIAGNOSIS — D034 Melanoma in situ of scalp and neck: Secondary | ICD-10-CM | POA: Diagnosis not present

## 2016-07-05 DIAGNOSIS — Z85820 Personal history of malignant melanoma of skin: Secondary | ICD-10-CM | POA: Diagnosis not present

## 2016-07-05 DIAGNOSIS — L570 Actinic keratosis: Secondary | ICD-10-CM | POA: Diagnosis not present

## 2016-07-05 DIAGNOSIS — L219 Seborrheic dermatitis, unspecified: Secondary | ICD-10-CM | POA: Diagnosis not present

## 2016-07-06 DIAGNOSIS — Z87891 Personal history of nicotine dependence: Secondary | ICD-10-CM | POA: Diagnosis not present

## 2016-07-06 DIAGNOSIS — E785 Hyperlipidemia, unspecified: Secondary | ICD-10-CM | POA: Diagnosis not present

## 2016-07-06 DIAGNOSIS — M109 Gout, unspecified: Secondary | ICD-10-CM | POA: Diagnosis not present

## 2016-07-06 DIAGNOSIS — N182 Chronic kidney disease, stage 2 (mild): Secondary | ICD-10-CM | POA: Diagnosis not present

## 2016-07-06 DIAGNOSIS — R5383 Other fatigue: Secondary | ICD-10-CM | POA: Diagnosis not present

## 2016-07-06 DIAGNOSIS — J301 Allergic rhinitis due to pollen: Secondary | ICD-10-CM | POA: Diagnosis not present

## 2016-07-06 DIAGNOSIS — F419 Anxiety disorder, unspecified: Secondary | ICD-10-CM | POA: Diagnosis not present

## 2016-07-06 DIAGNOSIS — J324 Chronic pansinusitis: Secondary | ICD-10-CM | POA: Diagnosis not present

## 2016-07-06 DIAGNOSIS — R05 Cough: Secondary | ICD-10-CM | POA: Diagnosis not present

## 2016-07-06 DIAGNOSIS — N200 Calculus of kidney: Secondary | ICD-10-CM | POA: Diagnosis not present

## 2016-07-06 DIAGNOSIS — Z85820 Personal history of malignant melanoma of skin: Secondary | ICD-10-CM | POA: Diagnosis not present

## 2016-07-06 DIAGNOSIS — J449 Chronic obstructive pulmonary disease, unspecified: Secondary | ICD-10-CM | POA: Diagnosis not present

## 2016-07-08 DIAGNOSIS — R42 Dizziness and giddiness: Secondary | ICD-10-CM | POA: Diagnosis not present

## 2016-07-08 DIAGNOSIS — R001 Bradycardia, unspecified: Secondary | ICD-10-CM | POA: Diagnosis not present

## 2016-07-08 DIAGNOSIS — E785 Hyperlipidemia, unspecified: Secondary | ICD-10-CM | POA: Diagnosis not present

## 2016-07-08 DIAGNOSIS — I10 Essential (primary) hypertension: Secondary | ICD-10-CM | POA: Diagnosis not present

## 2016-07-12 ENCOUNTER — Encounter (HOSPITAL_COMMUNITY): Payer: Self-pay | Admitting: Psychiatry

## 2016-07-12 ENCOUNTER — Ambulatory Visit (INDEPENDENT_AMBULATORY_CARE_PROVIDER_SITE_OTHER): Payer: Medicare Other | Admitting: Psychiatry

## 2016-07-12 VITALS — BP 110/68 | HR 58 | Ht 69.0 in | Wt 149.8 lb

## 2016-07-12 DIAGNOSIS — Z818 Family history of other mental and behavioral disorders: Secondary | ICD-10-CM

## 2016-07-12 DIAGNOSIS — F331 Major depressive disorder, recurrent, moderate: Secondary | ICD-10-CM | POA: Diagnosis not present

## 2016-07-12 DIAGNOSIS — Z79899 Other long term (current) drug therapy: Secondary | ICD-10-CM | POA: Diagnosis not present

## 2016-07-12 DIAGNOSIS — Z87891 Personal history of nicotine dependence: Secondary | ICD-10-CM | POA: Diagnosis not present

## 2016-07-12 DIAGNOSIS — Z7982 Long term (current) use of aspirin: Secondary | ICD-10-CM | POA: Diagnosis not present

## 2016-07-12 MED ORDER — TRAZODONE HCL 100 MG PO TABS: 200.0000 mg | ORAL_TABLET | Freq: Every day | ORAL | 0 refills | Status: DC

## 2016-07-12 MED ORDER — LAMOTRIGINE 150 MG PO TABS: 150.0000 mg | ORAL_TABLET | Freq: Every day | ORAL | 0 refills | Status: DC

## 2016-07-12 MED ORDER — BUPROPION HCL ER (XL) 150 MG PO TB24: ORAL_TABLET | ORAL | 0 refills | Status: DC

## 2016-07-12 MED ORDER — LORAZEPAM 0.5 MG PO TABS: 0.5000 mg | ORAL_TABLET | Freq: Every day | ORAL | 0 refills | Status: AC | PRN

## 2016-07-12 NOTE — Progress Notes (Signed)
[facility] MD/PA/NP OP Progress Note  07/12/2016 11:38 AM Colin Galvan.  MRN:  [id number]  Chief Complaint:  Subjective:  I'm noticing more irritable and frustrated.  I think my wife needs to see psychiatrist.  HPI: Colin Galvan came for his follow-up appointment.  On his last visit we increase Lamictal but he has noticed itching .  Overall he is not doing as good.  He admitted irritability, anger, frustration and having highs and lows in his mood.  His wife recently had knee surgery and he is a primary caretaker.  He believes his wife has a lot of anxiety and depression but she is not interested to see psychiatrist.  She used to see Dr. [name] for counseling however have not seen therapist in recent months.  Patient is concerned about his own behavior because sometimes he gets very frustrated and agitated.  He does not see a huge improvement since increase Lamictal and he notice itching.  He denies any rash .  He denies any suicidal thoughts or homicidal thought.  He recently diagnosed with melanoma fourth time .  He is seeing specialist at [facility] and scheduled to see physician next week for possible treatment option.  He may need surgery.  He feels tired, no motivation, continues to have racing thoughts and some nights poor sleep.  He denies any tremors or any rash.  He has not done any volunteer work because he is taking care for his wife.  His appetite is okay.  His vital signs are stable.  His energy level is low.  He lives with his wife.  Patient has mild cognitive impairment.  His other physical issue is pain in his shoulder.  Visit Diagnosis:    ICD-9-CM ICD-10-CM   1. Moderate episode of recurrent major depressive disorder (HCC) 296.32 F33.1 traZODone (DESYREL) 100 MG tablet     lamoTRIgine (LAMICTAL) 150 MG tablet     buPROPion (WELLBUTRIN XL) 150 MG 24 hr tablet     LORazepam (ATIVAN) 0.5 MG tablet    Past Psychiatric History: Reviewed.   Patient has history of depression for almost 20  years.  In the past he has tried Cymbalta, Klonopin and Prestiq.  Patient denies any history of mania or psychosis.  He denies any history of psychiatric inpatient treatment or any suicidal attempt.  Past Medical History:  Past Medical History:  Diagnosis Date  . Allergic rhinitis   . Depression   . Family hx of colon cancer   . Family hx of melanoma   . Fatigue   . Gout   . Hx of colonic polyps   . Hyperlipidemia   . Hypertension   . Nephrolithiasis   . Other diseases of lung, not elsewhere classified   . Vitamin D deficiency     Past Surgical History:  Procedure Laterality Date  . ROTATOR CUFF REPAIR Right 02/2016  . SKIN CANCER EXCISION      Family Psychiatric History: Reviewed.   Family History:  Family History  Problem Relation Age of Onset  . Colon cancer Father     Deceased, 66  . Stroke Father   . Melanoma Mother     Deceased, 10  . Hypertension Mother   . Diabetes Mellitus II Brother   . Stroke Brother     Deceased, 76  . Depression Son     Social History:  Social History   Social History  . Marital status: Married    Spouse name: N/A  . Number of  children: N/A  . Years of education: N/A   Occupational History  . RETIRED    Social History Main Topics  . Smoking status: Former Smoker    Quit date: 03/22/1980  . Smokeless tobacco: Never Used  . Alcohol use 0.0 oz/week     Comment: 1 beer/mixed drink daily  . Drug use: No  . Sexual activity: Not Asked   Other Topics Concern  . None   Social History Narrative   Lives with wife.    Retired Forensic psychologist, Chief Strategy Officer.   Highest level of education:  JD    Allergies:  Allergies  Allergen Reactions  . Tetracycline Hcl     Metabolic Disorder Labs: No results found for: HGBA1C, MPG No results found for: PROLACTIN No results found for: CHOL, TRIG, HDL, CHOLHDL, VLDL, LDLCALC   Current Medications: Current Outpatient Prescriptions  Medication Sig Dispense Refill  . allopurinol (ZYLOPRIM) 300  MG tablet Take 300 mg by mouth daily.      Marland Kitchen amLODipine (NORVASC) 5 MG tablet     . aspirin 81 MG tablet Take 81 mg by mouth daily.      Marland Kitchen buPROPion (WELLBUTRIN XL) 150 MG 24 hr tablet TAKE ONE TABLET BY MOUTH ONCE DAILY IN THE MORNING 90 tablet 1  . carvedilol (COREG) 6.25 MG tablet     . Cetirizine HCl 10 MG CAPS Take by mouth.    . lamoTRIgine (LAMICTAL) 200 MG tablet Take 1 tablet (200 mg total) by mouth daily. 90 tablet 1  . omeprazole (PRILOSEC) 20 MG capsule     . simvastatin (ZOCOR) 40 MG tablet Take 40 mg by mouth daily.    . traZODone (DESYREL) 100 MG tablet Take 2 tablets (200 mg total) by mouth at bedtime. 180 tablet 1  . triamterene-hydrochlorothiazide (MAXZIDE-25) 37.5-25 MG tablet     . Vitamin D, Ergocalciferol, (DRISDOL) 50000 UNITS CAPS capsule Take by mouth.     No current facility-administered medications for this visit.     Neurologic: Headache: No Seizure: No Paresthesias: No  Musculoskeletal: Strength & Muscle Tone: within normal limits Gait & Station: normal Patient leans: N/A  Psychiatric Specialty Exam: Review of Systems  Constitutional: Negative.   Musculoskeletal:       Shoulder pain  Skin: Positive for itching. Negative for rash.  Psychiatric/Behavioral: The patient is nervous/anxious and has insomnia.     Blood pressure 110/68, pulse (!) 58, height 5\' 9"  (1.753 m), weight 149 lb 12.8 oz (67.9 kg).Body mass index is 22.12 kg/m.  General Appearance: Casual  Eye Contact:  Good  Speech:  Clear and Coherent  Volume:  Normal  Mood:  Anxious and Depressed  Affect:  Congruent  Thought Process:  Goal Directed  Orientation:  Full (Time, Place, and Person)  Thought Content: Logical and Rumination   Suicidal Thoughts:  No  Homicidal Thoughts:  No  Memory:  Immediate;   Fair Recent;   Fair Remote;   Fair  Judgement:  Good  Insight:  Good  Psychomotor Activity:  Normal  Concentration:  Concentration: Fair and Attention Span: Fair  Recall:  Good   Fund of Knowledge: Good  Language: Good  Akathisia:  No  Handed:  Right  AIMS (if indicated):  0  Assets:  Communication Skills Desire for Improvement Housing Resilience  ADL's:  Intact  Cognition: WNL  Sleep:  Fair   Assessment: Major depressive disorder, moderate.  Plan: I review his psychosocial stressors.  Patient believe that his depression and anxiety is due  to situation and if his wife started seeing therapist and psychiatrist and take the medication his depression and anxiety will improve.  Recommended that if his wife agreed then we can schedule appointment with the psychiatrist in this office.  Patient agreed with the plan.  I would also reduce his Lamictal 250 mg as 200 causing itching.  I recommended to use lorazepam 0.5 mg as needed if he is very irritable and very anxious.  Continue trazodone 200 mg daily and Wellbutrin 150 mg daily.  Reassurance given.  Recommended to call us back if he has any question or if he feel worsening of the symptom.  I will see him again in 3 months.  Discuss safety plan that anytime having active suicidal thoughts or homicidal thoughts and he need to call 911 or go to the local emergency room.  Nasia Cannan T., MD 07/12/2016, 11:38 AM

## 2016-07-19 DIAGNOSIS — D034 Melanoma in situ of scalp and neck: Secondary | ICD-10-CM | POA: Diagnosis not present

## 2016-07-20 HISTORY — PX: MELANOMA EXCISION: SHX5266

## 2016-07-29 ENCOUNTER — Telehealth (HOSPITAL_COMMUNITY): Payer: Self-pay | Admitting: Psychiatry

## 2016-07-29 NOTE — Telephone Encounter (Signed)
Patient like to see Dr. [name].  He was seeing him until recently Dr. [name] moved to Hospital.  Please call Dr. [name] if he like to see him.

## 2016-08-05 NOTE — Telephone Encounter (Signed)
Called Dr. [name] and left a voicemail for him to call me back about this patient.

## 2016-08-12 ENCOUNTER — Telehealth (HOSPITAL_COMMUNITY): Payer: Self-pay | Admitting: Psychiatry

## 2016-08-17 DIAGNOSIS — N400 Enlarged prostate without lower urinary tract symptoms: Secondary | ICD-10-CM | POA: Diagnosis not present

## 2016-08-20 DIAGNOSIS — Z85820 Personal history of malignant melanoma of skin: Secondary | ICD-10-CM | POA: Diagnosis not present

## 2016-08-20 DIAGNOSIS — D034 Melanoma in situ of scalp and neck: Secondary | ICD-10-CM | POA: Diagnosis not present

## 2016-08-24 ENCOUNTER — Ambulatory Visit (HOSPITAL_COMMUNITY): Payer: Self-pay | Admitting: Psychiatry

## 2016-08-25 DIAGNOSIS — H16223 Keratoconjunctivitis sicca, not specified as Sjogren's, bilateral: Secondary | ICD-10-CM | POA: Diagnosis not present

## 2016-08-25 DIAGNOSIS — H0289 Other specified disorders of eyelid: Secondary | ICD-10-CM | POA: Diagnosis not present

## 2016-08-25 DIAGNOSIS — H2513 Age-related nuclear cataract, bilateral: Secondary | ICD-10-CM | POA: Diagnosis not present

## 2016-09-01 DIAGNOSIS — R3912 Poor urinary stream: Secondary | ICD-10-CM | POA: Diagnosis not present

## 2016-09-01 DIAGNOSIS — N401 Enlarged prostate with lower urinary tract symptoms: Secondary | ICD-10-CM | POA: Diagnosis not present

## 2016-09-10 DIAGNOSIS — H16223 Keratoconjunctivitis sicca, not specified as Sjogren's, bilateral: Secondary | ICD-10-CM | POA: Diagnosis not present

## 2016-09-10 DIAGNOSIS — H40013 Open angle with borderline findings, low risk, bilateral: Secondary | ICD-10-CM | POA: Diagnosis not present

## 2016-09-10 DIAGNOSIS — H0289 Other specified disorders of eyelid: Secondary | ICD-10-CM | POA: Diagnosis not present

## 2016-09-10 DIAGNOSIS — H43813 Vitreous degeneration, bilateral: Secondary | ICD-10-CM | POA: Diagnosis not present

## 2016-09-10 DIAGNOSIS — H16142 Punctate keratitis, left eye: Secondary | ICD-10-CM | POA: Diagnosis not present

## 2016-09-10 DIAGNOSIS — H2513 Age-related nuclear cataract, bilateral: Secondary | ICD-10-CM | POA: Diagnosis not present

## 2016-09-15 DIAGNOSIS — L98491 Non-pressure chronic ulcer of skin of other sites limited to breakdown of skin: Secondary | ICD-10-CM | POA: Diagnosis not present

## 2016-09-15 DIAGNOSIS — C434 Malignant melanoma of scalp and neck: Secondary | ICD-10-CM | POA: Diagnosis not present

## 2016-10-06 ENCOUNTER — Encounter (HOSPITAL_COMMUNITY): Payer: Self-pay | Admitting: Psychiatry

## 2016-10-06 ENCOUNTER — Ambulatory Visit (INDEPENDENT_AMBULATORY_CARE_PROVIDER_SITE_OTHER): Payer: Medicare Other | Admitting: Psychiatry

## 2016-10-06 DIAGNOSIS — F331 Major depressive disorder, recurrent, moderate: Secondary | ICD-10-CM | POA: Diagnosis not present

## 2016-10-06 DIAGNOSIS — Z818 Family history of other mental and behavioral disorders: Secondary | ICD-10-CM

## 2016-10-06 DIAGNOSIS — Z87891 Personal history of nicotine dependence: Secondary | ICD-10-CM | POA: Diagnosis not present

## 2016-10-06 MED ORDER — TRAZODONE HCL 100 MG PO TABS: 200.0000 mg | ORAL_TABLET | Freq: Every day | ORAL | 0 refills | Status: DC

## 2016-10-06 MED ORDER — LAMOTRIGINE 150 MG PO TABS: 150.0000 mg | ORAL_TABLET | Freq: Every day | ORAL | 0 refills | Status: DC

## 2016-10-06 MED ORDER — BUPROPION HCL ER (XL) 150 MG PO TB24: ORAL_TABLET | ORAL | 0 refills | Status: DC

## 2016-10-06 NOTE — Progress Notes (Signed)
[facility] MD/PA/NP OP Progress Note  10/06/2016 11:03 AM Colin Galvan.  MRN:  [id number]  Chief Complaint:  Chief Complaint    Follow-up     Subjective:  I am doing good.  I'm seeing marriage counselor.  My relationship with my wife is going very well.  HPI: Patient came for his follow-up appointment.  On his last visit we decreased Lamictal 150 because he was complaining of itching.  He is doing much better since dose decrease.  He denies any itching or rash.  He also started seeing a marriage counselor with his wife and believed it is working very well.  He denies any irritability, anger, mania or any psychosis.  He sleeping good.  He is not doing any volunteer work but keeping himself very busy.  He spent mostly time at [employer].  Recently he need a surgery for melanoma and easily it went well.  Patient denies any suicidal thoughts or homicidal thought.  He denies any feeling of hopelessness or worthlessness.  His attention concentration is okay.  Sometime he is forgetful but denies any issues with driving or his ADLs.  Patient is excited about upcoming trip with his wife to [place] in October.  Patient like to keep his current medication.  He has not taken any lorazepam since the last visit.  He still has refill remaining.    Visit Diagnosis:    ICD-10-CM   1. Moderate episode of recurrent major depressive disorder (HCC) F33.1 traZODone (DESYREL) 100 MG tablet    lamoTRIgine (LAMICTAL) 150 MG tablet    buPROPion (WELLBUTRIN XL) 150 MG 24 hr tablet    Past Psychiatric History: Reviewed. Patient has history of depression for almost 20 years. In the past he has tried Cymbalta, Klonopin and Prestiq. Patient denies any history of mania or psychosis. He denies any history of psychiatric inpatient treatment or any suicidal attempt.  We tried increasing Lamictal but he developed itching and we reduced the dose.  Past Medical History:  Past Medical History:  Diagnosis Date  . Allergic rhinitis    . Depression   . Family hx of colon cancer   . Family hx of melanoma   . Fatigue   . Gout   . Hx of colonic polyps   . Hyperlipidemia   . Hypertension   . Nephrolithiasis   . Other diseases of lung, not elsewhere classified   . Vitamin D deficiency     Past Surgical History:  Procedure Laterality Date  . MELANOMA EXCISION N/A 07/2016  . ROTATOR CUFF REPAIR Right 02/2016  . SKIN CANCER EXCISION      Family Psychiatric History: Reviewed.  Family History:  Family History  Problem Relation Age of Onset  . Colon cancer Father        Deceased, 55  . Stroke Father   . Melanoma Mother        Deceased, 46  . Hypertension Mother   . Diabetes Mellitus II Brother   . Stroke Brother        Deceased, 80  . Depression Son     Social History:  Social History   Social History  . Marital status: Married    Spouse name: N/A  . Number of children: N/A  . Years of education: N/A   Occupational History  . RETIRED    Social History Main Topics  . Smoking status: Former Smoker    Quit date: 03/22/1980  . Smokeless tobacco: Never Used  . Alcohol use 5.4  oz/week    3 Glasses of wine, 6 Cans of beer per week     Comment: 1 beer/mixed drink daily  . Drug use: No  . Sexual activity: Yes    Partners: Female    Birth control/ protection: None   Other Topics Concern  . None   Social History Narrative   Lives with wife.    Retired Forensic psychologist, Chief Strategy Officer.   Highest level of education:  JD    Allergies:  Allergies  Allergen Reactions  . Tetracycline Hcl     Metabolic Disorder Labs: No results found for: HGBA1C, MPG No results found for: PROLACTIN No results found for: CHOL, TRIG, HDL, CHOLHDL, VLDL, LDLCALC   Current Medications: Current Outpatient Prescriptions  Medication Sig Dispense Refill  . allopurinol (ZYLOPRIM) 300 MG tablet Take 300 mg by mouth daily.      Marland Kitchen amLODipine (NORVASC) 5 MG tablet     . buPROPion (WELLBUTRIN XL) 150 MG 24 hr tablet TAKE ONE  TABLET BY MOUTH ONCE DAILY IN THE MORNING 90 tablet 0  . carvedilol (COREG) 6.25 MG tablet     . lamoTRIgine (LAMICTAL) 150 MG tablet Take 1 tablet (150 mg total) by mouth daily. 90 tablet 0  . LORazepam (ATIVAN) 0.5 MG tablet Take 1 tablet (0.5 mg total) by mouth daily as needed for anxiety. 20 tablet 0  . simvastatin (ZOCOR) 40 MG tablet Take 40 mg by mouth daily.    . traZODone (DESYREL) 100 MG tablet Take 2 tablets (200 mg total) by mouth at bedtime. 180 tablet 0  . triamterene-hydrochlorothiazide (MAXZIDE-25) 37.5-25 MG tablet     . Vitamin D, Ergocalciferol, (DRISDOL) 50000 UNITS CAPS capsule Take by mouth.    Marland Kitchen aspirin 81 MG tablet Take 81 mg by mouth daily.      . Cetirizine HCl 10 MG CAPS Take by mouth.    Marland Kitchen omeprazole (PRILOSEC) 20 MG capsule      No current facility-administered medications for this visit.     Neurologic: Headache: No Seizure: No Paresthesias: No  Musculoskeletal: Strength & Muscle Tone: within normal limits Gait & Station: normal Patient leans: N/A  Psychiatric Specialty Exam: ROS  Blood pressure 114/78, pulse 73, height 5' 7.25" (1.708 m), weight 148 lb (67.1 kg).Body mass index is 23.01 kg/m.  General Appearance: Casual  Eye Contact:  Good  Speech:  Clear and Coherent  Volume:  Normal  Mood:  Euthymic  Affect:  Appropriate  Thought Process:  Goal Directed  Orientation:  Full (Time, Place, and Person)  Thought Content: Logical   Suicidal Thoughts:  No  Homicidal Thoughts:  No  Memory:  Immediate;   Fair Recent;   Fair Remote;   Good  Judgement:  Good  Insight:  Good  Psychomotor Activity:  Normal  Concentration:  Concentration: Fair and Attention Span: Fair  Recall:  Good  Fund of Knowledge: Good  Language: Good  Akathisia:  No  Handed:  Right  AIMS (if indicated):  0  Assets:  Communication Skills Desire for Improvement Housing Resilience  ADL's:  Intact  Cognition: WNL  Sleep:  good   Assessment: Major depressive disorder,  recurrent  Plan:  patient is doing better since he started marriage counseling with his wife.  He reported improved relationship.  I will continue Lamictal 150 mg daily, Wellbutrin 150 mg daily and trazodone 200 mg at bedtime.  He has not used lorazepam which was given last time.  Recommended to call us back if he has  any question, concern or if he feel worsening of the symptom.  Follow-up in 3 months.  Maureena Dabbs T., MD 10/06/2016, 11:03 AM

## 2016-10-11 ENCOUNTER — Other Ambulatory Visit (HOSPITAL_COMMUNITY): Payer: Self-pay | Admitting: Psychiatry

## 2016-10-11 ENCOUNTER — Ambulatory Visit (HOSPITAL_COMMUNITY): Payer: Self-pay | Admitting: Psychiatry

## 2016-10-11 DIAGNOSIS — F331 Major depressive disorder, recurrent, moderate: Secondary | ICD-10-CM

## 2016-10-25 ENCOUNTER — Other Ambulatory Visit (HOSPITAL_COMMUNITY): Payer: Self-pay | Admitting: Psychiatry

## 2016-11-17 DIAGNOSIS — R0602 Shortness of breath: Secondary | ICD-10-CM | POA: Diagnosis not present

## 2016-11-17 DIAGNOSIS — R5383 Other fatigue: Secondary | ICD-10-CM | POA: Diagnosis not present

## 2016-11-17 DIAGNOSIS — J449 Chronic obstructive pulmonary disease, unspecified: Secondary | ICD-10-CM | POA: Diagnosis not present

## 2016-11-17 DIAGNOSIS — Z85820 Personal history of malignant melanoma of skin: Secondary | ICD-10-CM | POA: Diagnosis not present

## 2016-11-17 DIAGNOSIS — Z87891 Personal history of nicotine dependence: Secondary | ICD-10-CM | POA: Diagnosis not present

## 2016-11-17 DIAGNOSIS — E559 Vitamin D deficiency, unspecified: Secondary | ICD-10-CM | POA: Diagnosis not present

## 2016-11-17 DIAGNOSIS — J4520 Mild intermittent asthma, uncomplicated: Secondary | ICD-10-CM | POA: Diagnosis not present

## 2016-11-17 DIAGNOSIS — M109 Gout, unspecified: Secondary | ICD-10-CM | POA: Diagnosis not present

## 2016-11-17 DIAGNOSIS — E785 Hyperlipidemia, unspecified: Secondary | ICD-10-CM | POA: Diagnosis not present

## 2016-11-17 DIAGNOSIS — J301 Allergic rhinitis due to pollen: Secondary | ICD-10-CM | POA: Diagnosis not present

## 2016-11-17 DIAGNOSIS — R05 Cough: Secondary | ICD-10-CM | POA: Diagnosis not present

## 2016-11-17 DIAGNOSIS — N200 Calculus of kidney: Secondary | ICD-10-CM | POA: Diagnosis not present

## 2016-11-19 DIAGNOSIS — K219 Gastro-esophageal reflux disease without esophagitis: Secondary | ICD-10-CM | POA: Diagnosis not present

## 2016-11-19 DIAGNOSIS — J3089 Other allergic rhinitis: Secondary | ICD-10-CM | POA: Diagnosis not present

## 2016-11-19 DIAGNOSIS — L98491 Non-pressure chronic ulcer of skin of other sites limited to breakdown of skin: Secondary | ICD-10-CM | POA: Diagnosis not present

## 2016-11-25 DIAGNOSIS — G5762 Lesion of plantar nerve, left lower limb: Secondary | ICD-10-CM | POA: Diagnosis not present

## 2016-11-26 DIAGNOSIS — E785 Hyperlipidemia, unspecified: Secondary | ICD-10-CM | POA: Diagnosis not present

## 2016-11-26 DIAGNOSIS — R0602 Shortness of breath: Secondary | ICD-10-CM | POA: Diagnosis not present

## 2016-11-26 DIAGNOSIS — E559 Vitamin D deficiency, unspecified: Secondary | ICD-10-CM | POA: Diagnosis not present

## 2016-11-26 DIAGNOSIS — R5383 Other fatigue: Secondary | ICD-10-CM | POA: Diagnosis not present

## 2016-11-30 DIAGNOSIS — J040 Acute laryngitis: Secondary | ICD-10-CM | POA: Diagnosis not present

## 2016-12-08 ENCOUNTER — Emergency Department (HOSPITAL_COMMUNITY): Payer: Medicare Other

## 2016-12-08 ENCOUNTER — Emergency Department (HOSPITAL_COMMUNITY)
Admission: EM | Admit: 2016-12-08 | Discharge: 2016-12-09 | Disposition: A | Discharge status: Home / Self Care | Payer: Medicare Other | Attending: Physician Assistant | Admitting: Physician Assistant

## 2016-12-08 ENCOUNTER — Encounter (HOSPITAL_COMMUNITY): Payer: Self-pay | Admitting: *Deleted

## 2016-12-08 DIAGNOSIS — Z7982 Long term (current) use of aspirin: Secondary | ICD-10-CM | POA: Diagnosis not present

## 2016-12-08 DIAGNOSIS — J040 Acute laryngitis: Secondary | ICD-10-CM | POA: Insufficient documentation

## 2016-12-08 DIAGNOSIS — R300 Dysuria: Secondary | ICD-10-CM | POA: Insufficient documentation

## 2016-12-08 DIAGNOSIS — Z87891 Personal history of nicotine dependence: Secondary | ICD-10-CM | POA: Insufficient documentation

## 2016-12-08 DIAGNOSIS — J387 Other diseases of larynx: Secondary | ICD-10-CM | POA: Diagnosis not present

## 2016-12-08 DIAGNOSIS — I10 Essential (primary) hypertension: Secondary | ICD-10-CM | POA: Diagnosis not present

## 2016-12-08 DIAGNOSIS — R7989 Other specified abnormal findings of blood chemistry: Secondary | ICD-10-CM | POA: Insufficient documentation

## 2016-12-08 DIAGNOSIS — Z79899 Other long term (current) drug therapy: Secondary | ICD-10-CM | POA: Insufficient documentation

## 2016-12-08 DIAGNOSIS — R5383 Other fatigue: Secondary | ICD-10-CM | POA: Diagnosis present

## 2016-12-08 DIAGNOSIS — J029 Acute pharyngitis, unspecified: Secondary | ICD-10-CM | POA: Insufficient documentation

## 2016-12-08 DIAGNOSIS — R0789 Other chest pain: Secondary | ICD-10-CM | POA: Diagnosis not present

## 2016-12-08 LAB — CBC WITH DIFFERENTIAL/PLATELET
Basophils Absolute: 0 10*3/uL (ref 0.0–0.1)
Basophils Relative: 0 %
Eosinophils Absolute: 0.3 10*3/uL (ref 0.0–0.7)
Eosinophils Relative: 4 %
HEMATOCRIT: 40.1 % (ref 39.0–52.0)
HEMOGLOBIN: 13.6 g/dL (ref 13.0–17.0)
LYMPHS ABS: 1.2 10*3/uL (ref 0.7–4.0)
Lymphocytes Relative: 16 %
MCH: 31.4 pg (ref 26.0–34.0)
MCHC: 33.9 g/dL (ref 30.0–36.0)
MCV: 92.6 fL (ref 78.0–100.0)
MONOS PCT: 6 %
Monocytes Absolute: 0.5 10*3/uL (ref 0.1–1.0)
NEUTROS ABS: 5.6 10*3/uL (ref 1.7–7.7)
NEUTROS PCT: 74 %
Platelets: 190 10*3/uL (ref 150–400)
RBC: 4.33 MIL/uL (ref 4.22–5.81)
RDW: 13 % (ref 11.5–15.5)
WBC: 7.6 10*3/uL (ref 4.0–10.5)

## 2016-12-08 LAB — COMPREHENSIVE METABOLIC PANEL
ALT: 13 U/L — ABNORMAL LOW (ref 17–63)
ANION GAP: 7 (ref 5–15)
AST: 17 U/L (ref 15–41)
Albumin: 3.8 g/dL (ref 3.5–5.0)
Alkaline Phosphatase: 57 U/L (ref 38–126)
BUN: 18 mg/dL (ref 6–20)
CHLORIDE: 100 mmol/L — AB (ref 101–111)
CO2: 28 mmol/L (ref 22–32)
Calcium: 9.9 mg/dL (ref 8.9–10.3)
Creatinine, Ser: 1.64 mg/dL — ABNORMAL HIGH (ref 0.61–1.24)
GFR calc non Af Amer: 40 mL/min — ABNORMAL LOW (ref >60)
GFR, EST AFRICAN AMERICAN: 46 mL/min — AB (ref >60)
Glucose, Bld: 90 mg/dL (ref 65–99)
POTASSIUM: 3.6 mmol/L (ref 3.5–5.1)
SODIUM: 135 mmol/L (ref 135–145)
Total Bilirubin: 0.9 mg/dL (ref 0.3–1.2)
Total Protein: 6.1 g/dL — ABNORMAL LOW (ref 6.5–8.1)

## 2016-12-08 LAB — URINALYSIS, ROUTINE W REFLEX MICROSCOPIC
BILIRUBIN URINE: NEGATIVE
Glucose, UA: NEGATIVE mg/dL
HGB URINE DIPSTICK: NEGATIVE
Ketones, ur: NEGATIVE mg/dL
Leukocytes, UA: NEGATIVE
NITRITE: NEGATIVE
PH: 6 (ref 5.0–8.0)
Protein, ur: NEGATIVE mg/dL
SPECIFIC GRAVITY, URINE: 1.01 (ref 1.005–1.030)

## 2016-12-08 MED ORDER — IPRATROPIUM-ALBUTEROL 0.5-2.5 (3) MG/3ML IN SOLN
3.0000 mL | Freq: Once | RESPIRATORY_TRACT | Status: AC
  Administered 2016-12-08: 3 mL via RESPIRATORY_TRACT
  Filled 2016-12-08: qty 3

## 2016-12-08 MED ORDER — IOPAMIDOL (ISOVUE-300) INJECTION 61%
INTRAVENOUS | Status: AC
  Administered 2016-12-08: 75 mL
  Filled 2016-12-08: qty 75

## 2016-12-08 MED ORDER — SODIUM CHLORIDE 0.9 % IV BOLUS (SEPSIS)
1000.0000 mL | Freq: Once | INTRAVENOUS | Status: AC
  Administered 2016-12-08: 1000 mL via INTRAVENOUS

## 2016-12-08 NOTE — ED Provider Notes (Signed)
[facility] DEPT Provider Note   CSN: [id number] Arrival date & time: 12/08/16  1726     History   Chief Complaint Chief Complaint  Patient presents with  . Fatigue  . Sore Throat  . Dysuria    HPI Colin Galvan. is a 71 y.o. male.  Patient presents to the ED with a chief complaint of sore throat.  He states that he has lost his voice and had a sore throat for the past 2 weeks.  He states that he has seen his doctor as well as and ENT in [place], where he had a scope done which showed nothing.  He states that due to the persistent nature of his symptoms, his PCP sent him to the ED for further evaluation and CT imaging.  Patient's PCP spoke with Dr. Thomasene Lot over the phone today, and also stated that he was concerned about a possible kidney infection due to increase white count and the patient having urinary hesitancy.  Patient states that he has tried taking prednisone with no relief.  Patient denies any fever, or chills.  He states that he has had some associated nonproductive cough, as well as some chest tightness.  He is a never smoker and    The history is provided by the patient. No language interpreter was used.    Past Medical History:  Diagnosis Date  . Allergic rhinitis   . Depression   . Family hx of colon cancer   . Family hx of melanoma   . Fatigue   . Gout   . Hx of colonic polyps   . Hyperlipidemia   . Hypertension   . Nephrolithiasis   . Other diseases of lung, not elsewhere classified   . Vitamin D deficiency     Patient Active Problem List   Diagnosis Date Noted  . Paresthesia of foot, bilateral 06/25/2014  . Paresthesia of both feet 06/25/2014  . H/O malignant melanoma of skin 06/07/2014  . Strain of forearm, left 05/10/2014  . Right shoulder pain 05/10/2014  . Weight loss 12/15/2011  . COLONIC POLYPS, BENIGN 04/21/2010  . UNSPECIFIED VITAMIN D DEFICIENCY 04/21/2010  . HYPERLIPIDEMIA 04/21/2010  . GOUT, UNSPECIFIED 04/21/2010  .  DEPRESSION, MILD 04/21/2010  . HYPERTENSION 04/21/2010  . ALLERGIC RHINITIS CAUSE UNSPECIFIED 04/21/2010  . OTHER DISEASES OF LUNG NOT ELSEWHERE CLASSIFIED 04/21/2010  . NEPHROLITHIASIS 04/21/2010  . FATIGUE 04/21/2010    Past Surgical History:  Procedure Laterality Date  . MELANOMA EXCISION N/A 07/2016  . ROTATOR CUFF REPAIR Right 02/2016  . SKIN CANCER EXCISION         Home Medications    Prior to Admission medications   Medication Sig Start Date End Date Taking? Authorizing Provider  allopurinol (ZYLOPRIM) 300 MG tablet Take 300 mg by mouth daily.      [provider]  amLODipine (NORVASC) 5 MG tablet  10/25/13   [provider]  aspirin 81 MG tablet Take 81 mg by mouth daily.      [provider]  buPROPion (WELLBUTRIN XL) 150 MG 24 hr tablet TAKE ONE TABLET BY MOUTH ONCE DAILY IN THE MORNING 10/06/16   Arfeen, Arlyce Harman, MD  carvedilol (COREG) 6.25 MG tablet  09/26/14   [provider]  Cetirizine HCl 10 MG CAPS Take by mouth. 03/25/15 03/24/16  [provider]  lamoTRIgine (LAMICTAL) 150 MG tablet Take 1 tablet (150 mg total) by mouth daily. 10/06/16   Arfeen, Arlyce Harman, MD  LORazepam (ATIVAN) 0.5  MG tablet Take 1 tablet (0.5 mg total) by mouth daily as needed for anxiety. 07/12/16 07/12/17  Kathlee Nations, MD  omeprazole (PRILOSEC) 20 MG capsule  01/22/15   [provider]  simvastatin (ZOCOR) 40 MG tablet Take 40 mg by mouth daily.    [provider]  traZODone (DESYREL) 100 MG tablet Take 2 tablets (200 mg total) by mouth at bedtime. 10/06/16   Kathlee Nations, MD  triamterene-hydrochlorothiazide [facility]) 37.5-25 MG tablet  05/29/15   [provider]  Vitamin D, Ergocalciferol, (DRISDOL) 50000 UNITS CAPS capsule Take by mouth.    [provider]    Family History Family History  Problem Relation Age of Onset  . Colon cancer Father        Deceased, 1  . Stroke Father   . Melanoma Mother        Deceased,  53  . Hypertension Mother   . Diabetes Mellitus II Brother   . Stroke Brother        Deceased, 33  . Depression Son     Social History Social History  Substance Use Topics  . Smoking status: Former Smoker    Quit date: 03/22/1980  . Smokeless tobacco: Never Used  . Alcohol use 5.4 oz/week    3 Glasses of wine, 6 Cans of beer per week     Comment: 1 beer/mixed drink daily     Allergies   Tetracycline hcl   Review of Systems Review of Systems  All other systems reviewed and are negative.    Physical Exam Updated Vital Signs BP 119/80 (BP Location: Left Arm)   Pulse 66   Temp 98.6 F (37 C) (Oral)   Resp 18   SpO2 99%   Physical Exam  Constitutional: He is oriented to person, place, and time. He appears well-developed and well-nourished.  HENT:  Head: Normocephalic and atraumatic.  Oropharynx is clear, no abscess, no visible masses, no stridor, tolerating secretions, speaks in a whisper  Eyes: Pupils are equal, round, and reactive to light. Conjunctivae and EOM are normal. Right eye exhibits no discharge. Left eye exhibits no discharge. No scleral icterus.  Neck: Normal range of motion. Neck supple. No JVD present.  Cardiovascular: Normal rate, regular rhythm and normal heart sounds.  Exam reveals no gallop and no friction rub.   No murmur heard. Pulmonary/Chest: Effort normal and breath sounds normal. No respiratory distress. He has no wheezes. He has no rales. He exhibits no tenderness.  Abdominal: Soft. He exhibits no distension and no mass. There is no tenderness. There is no rebound and no guarding.  Musculoskeletal: Normal range of motion. He exhibits no edema or tenderness.  Neurological: He is alert and oriented to person, place, and time.  Skin: Skin is warm and dry.  Psychiatric: He has a normal mood and affect. His behavior is normal. Judgment and thought content normal.  Nursing note and vitals reviewed.    ED Treatments / Results  Labs (all labs  ordered are listed, but only abnormal results are displayed) Labs Reviewed  COMPREHENSIVE METABOLIC PANEL - Abnormal; Notable for the following:       Result Value   Chloride 100 (*)    Creatinine, Ser 1.64 (*)    Total Protein 6.1 (*)    ALT 13 (*)    GFR calc non Af Amer 40 (*)    GFR calc Af Amer 46 (*)    All other components within normal limits  CBC WITH  DIFFERENTIAL/PLATELET  URINALYSIS, ROUTINE W REFLEX MICROSCOPIC    EKG  EKG Interpretation None       Radiology Ct Soft Tissue Neck W Contrast  Result Date: 12/08/2016 CLINICAL DATA:  Initial evaluation for chronic laryngitis for 2 weeks. EXAM: CT NECK WITH CONTRAST TECHNIQUE: Multidetector CT imaging of the neck was performed using the standard protocol following the bolus administration of intravenous contrast. CONTRAST:  <See Chart> ISOVUE-300 IOPAMIDOL (ISOVUE-300) INJECTION 61% COMPARISON:  None. FINDINGS: Pharynx and larynx: Oral cavity within normal limits without mass lesion or loculated fluid collection. No acute abnormality about the dentition. Palatine tonsils symmetric are within normal limits. Parapharyngeal fat preserved. Nasopharynx within normal limits. Epiglottis demonstrates no acute inflammatory changes. There is 6 mm nodular thickening at the left base of the epiglottis, protruding into the vallecula (series 3, image 56). Mild nodularity of the vallecular mucosa noted on sagittal projection as well. Vallecula otherwise clear. Retropharyngeal soft tissues within normal limits. Left piriform sinus effaced and not well visualized. Right piriform sinus clear. Remainder of the hypopharynx and supraglottic larynx demonstrates no acute abnormality. True cords are largely apposed and not well evaluated. Minimal layering secretions noted within the subglottic airway. Salivary glands: Salivary glands including the parotid and submandibular glands demonstrate no acute abnormality. Subcentimeter sialolith noted within the left  submandibular gland. Salivary glands otherwise unremarkable. Thyroid: Thyroid within normal limits. Lymph nodes: No pathologically enlarged lymph nodes identified within the neck. Vascular: Normal intravascular enhancement seen throughout the neck. Left carotid artery's medialized into the retropharyngeal space. Limited intracranial: Unremarkable. Visualized orbits: Limited visualization orbits is unremarkable. Mastoids and visualized paranasal sinuses: Mucosal thickening within the inferior maxillary sinuses. Visualized paranasal sinuses otherwise clear. Mastoids and middle ear cavities are clear. Skeleton: No acute osseus abnormality. No worrisome lytic or blastic osseous lesions. 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 Upper chest: Visualized upper chest demonstrates no acute abnormality. Visualized lungs are clear. Azygos lobe noted. Other: None. IMPRESSION: 1. 6 mm nodular density at the anterior base of the epiglottis as above, indeterminate. ENT referral for possible direct visualization recommended. 2. No other acute abnormality within the neck. No significant inflammatory changes. 3. Mild left submandibular sialolithiasis. Electronically Signed   By: Jeannine Boga M.D.   On: 12/08/2016 21:32    Procedures Procedures (including critical care time)  Medications Ordered in ED Medications  sodium chloride 0.9 % bolus 1,000 mL (not administered)  ipratropium-albuterol (DUONEB) 0.5-2.5 (3) MG/3ML nebulizer solution 3 mL (not administered)  iopamidol (ISOVUE-300) 61 % injection (75 mLs  Contrast Given 12/08/16 2100)     Initial Impression / Assessment and Plan / ED Course  I have reviewed the triage vital signs and the nursing notes.  Pertinent labs & imaging results that were available during my care of the patient were reviewed by me and considered in my medical decision making (see chart for details).     Patient with laryngitis symptoms 2 weeks. Sent  to ED for CT scan of his neck. CT scan shows 6 mm nodule of his epiglottis.  I discussed these findings with ENT, Dr. Janace Hoard, who recommends outpatient follow-up. Patient is tolerating secretions, has no difficulty breathing or swallowing. His vital signs are stable. He has no evidence of infection on CT or on physical exam. He does have an elevated creatinine, but I have no other values to compare this to. Recommend close recheck with his PCP and increasing fluids. Patient understands and agrees with this plan.  Patient seen  by and discussed with Dr. Thomasene Lot, who agrees with the plan.  Final Clinical Impressions(s) / ED Diagnoses   Final diagnoses:  Mass of epiglottis  Laryngitis  Elevated serum creatinine    New Prescriptions New Prescriptions   No medications on file     Montine Circle, Hershal Coria 12/08/16 2356    Macarthur Critchley, MD 12/12/16 1002

## 2016-12-08 NOTE — ED Triage Notes (Signed)
Pt reports having laryngitis x 2 weeks, nonproductive cough, no fever, generalized fatigue. Has taken meds x 2 weeks with no relief. Also having difficulty urinating x 2 weeks. No acute distress noted at triage.

## 2016-12-08 NOTE — Discharge Instructions (Signed)
You have a 6 mm nodule on your epiglottis.  It is recommended that you follow-up with an ENT doctor for this.  I spoke with Dr. Janace Hoard, the on call ENT in [facility].  He said that he is happy to see you in his office, or you can see your ENT in [place].  You must dial 911 if you have difficulty breathing, swallowing, or drooling.  You have an elevated serum creatinine, which measures kidney function.  Please drink more water and have this rechecked by your doctor.  There is no evidence of infection in your urine or your blood work today.  CLINICAL DATA:  Initial evaluation for chronic laryngitis for 2  weeks.     EXAM:  CT NECK WITH CONTRAST     TECHNIQUE:  Multidetector CT imaging of the neck was performed using the  standard protocol following the bolus administration of intravenous  contrast.     CONTRAST:  <See Chart> ISOVUE-300 IOPAMIDOL (ISOVUE-300) INJECTION  61%     COMPARISON:  None.     FINDINGS:  Pharynx and larynx: Oral cavity within normal limits without mass  lesion or loculated fluid collection. No acute abnormality about the  dentition. Palatine tonsils symmetric are within normal limits.  Parapharyngeal fat preserved. Nasopharynx within normal limits.  Epiglottis demonstrates no acute inflammatory changes. There is 6 mm  nodular thickening at the left base of the epiglottis, protruding  into the vallecula (series 3, image 56). Mild nodularity of the  vallecular mucosa noted on sagittal projection as well. Vallecula  otherwise clear. Retropharyngeal soft tissues within normal limits.  Left piriform sinus effaced and not well visualized. Right piriform  sinus clear. Remainder of the hypopharynx and supraglottic larynx  demonstrates no acute abnormality. True cords are largely apposed  and not well evaluated. Minimal layering secretions noted within the  subglottic airway.     Salivary glands: Salivary glands including the parotid and  submandibular  glands demonstrate no acute abnormality. Subcentimeter  sialolith noted within the left submandibular gland. Salivary glands  otherwise unremarkable.     Thyroid: Thyroid within normal limits.     Lymph nodes: No pathologically enlarged lymph nodes identified  within the neck.     Vascular: Normal intravascular enhancement seen throughout the neck.  Left carotid artery's medialized into the retropharyngeal space.     Limited intracranial: Unremarkable.     Visualized orbits: Limited visualization orbits is unremarkable.     Mastoids and visualized paranasal sinuses: Mucosal thickening within  the inferior maxillary sinuses. Visualized paranasal sinuses  otherwise clear. Mastoids and middle ear cavities are clear.     Skeleton: No acute osseus abnormality. No worrisome lytic or blastic  osseous lesions. Mild to moderate degenerate spondylolysis present  at C4-5 through C6-7.     Upper chest: Visualized upper chest demonstrates no acute  abnormality. Visualized lungs are clear. Azygos lobe noted.     Other: None.     IMPRESSION:  1. 6 mm nodular density at the anterior base of the epiglottis as  above, indeterminate. ENT referral for possible direct visualization  recommended.  2. No other acute abnormality within the neck. No significant  inflammatory changes.  3. Mild left submandibular sialolithiasis.        Electronically Signed    By: Jeannine Boga M.D.    On: 12/08/2016 21:32

## 2016-12-08 NOTE — ED Notes (Addendum)
Pt. To xray via stretcher

## 2016-12-09 NOTE — ED Notes (Signed)
Patient Alert and oriented X4. Stable and ambulatory. Patient verbalized understanding of the discharge instructions.  Patient belongings were taken by the patient.  

## 2016-12-10 DIAGNOSIS — J342 Deviated nasal septum: Secondary | ICD-10-CM | POA: Diagnosis not present

## 2016-12-10 DIAGNOSIS — J383 Other diseases of vocal cords: Secondary | ICD-10-CM | POA: Diagnosis not present

## 2016-12-10 DIAGNOSIS — J387 Other diseases of larynx: Secondary | ICD-10-CM | POA: Diagnosis not present

## 2016-12-10 DIAGNOSIS — K219 Gastro-esophageal reflux disease without esophagitis: Secondary | ICD-10-CM | POA: Diagnosis not present

## 2016-12-10 DIAGNOSIS — R0981 Nasal congestion: Secondary | ICD-10-CM | POA: Diagnosis not present

## 2016-12-14 DIAGNOSIS — H1013 Acute atopic conjunctivitis, bilateral: Secondary | ICD-10-CM | POA: Diagnosis not present

## 2016-12-14 DIAGNOSIS — H5711 Ocular pain, right eye: Secondary | ICD-10-CM | POA: Diagnosis not present

## 2017-01-03 DIAGNOSIS — L570 Actinic keratosis: Secondary | ICD-10-CM | POA: Diagnosis not present

## 2017-01-03 DIAGNOSIS — R21 Rash and other nonspecific skin eruption: Secondary | ICD-10-CM | POA: Diagnosis not present

## 2017-01-03 DIAGNOSIS — D485 Neoplasm of uncertain behavior of skin: Secondary | ICD-10-CM | POA: Diagnosis not present

## 2017-01-03 DIAGNOSIS — D225 Melanocytic nevi of trunk: Secondary | ICD-10-CM | POA: Diagnosis not present

## 2017-01-03 DIAGNOSIS — Z85828 Personal history of other malignant neoplasm of skin: Secondary | ICD-10-CM | POA: Diagnosis not present

## 2017-01-04 DIAGNOSIS — Z23 Encounter for immunization: Secondary | ICD-10-CM | POA: Diagnosis not present

## 2017-01-06 ENCOUNTER — Ambulatory Visit (HOSPITAL_COMMUNITY): Payer: Self-pay | Admitting: Psychiatry

## 2017-01-10 ENCOUNTER — Ambulatory Visit (HOSPITAL_COMMUNITY): Payer: Self-pay | Admitting: Psychiatry

## 2017-02-01 ENCOUNTER — Ambulatory Visit (INDEPENDENT_AMBULATORY_CARE_PROVIDER_SITE_OTHER): Payer: Medicare Other | Admitting: Psychiatry

## 2017-02-01 ENCOUNTER — Encounter (HOSPITAL_COMMUNITY): Payer: Self-pay | Admitting: Psychiatry

## 2017-02-01 DIAGNOSIS — Z630 Problems in relationship with spouse or partner: Secondary | ICD-10-CM | POA: Diagnosis not present

## 2017-02-01 DIAGNOSIS — F331 Major depressive disorder, recurrent, moderate: Secondary | ICD-10-CM | POA: Diagnosis not present

## 2017-02-01 DIAGNOSIS — Z818 Family history of other mental and behavioral disorders: Secondary | ICD-10-CM

## 2017-02-01 DIAGNOSIS — Z87891 Personal history of nicotine dependence: Secondary | ICD-10-CM

## 2017-02-01 MED ORDER — LAMOTRIGINE 150 MG PO TABS: 150.0000 mg | ORAL_TABLET | Freq: Every day | ORAL | 0 refills | Status: DC

## 2017-02-01 MED ORDER — BUPROPION HCL ER (XL) 150 MG PO TB24: ORAL_TABLET | ORAL | 0 refills | Status: DC

## 2017-02-01 MED ORDER — TRAZODONE HCL 100 MG PO TABS: 200.0000 mg | ORAL_TABLET | Freq: Every day | ORAL | 0 refills | Status: DC

## 2017-02-01 NOTE — Progress Notes (Signed)
[facility] MD/PA/NP OP Progress Note  02/01/2017 9:58 AM Colin Galvan.  MRN:  [id number]  Chief Complaint: I am stressed about my marriage life.  HPI: Patient came for her follow-up appointment.  He admitted increased stress in his marriage life.  His wife stopped marriage counseling.  Lately he had to give up his long-standing friendship because his wife was not happy with the relationship.  Patient endorses wife is very emotional and he himself is very rational.  He admitted lately increased anxiety and some time he feels guilty when he ended the relationship.  Patient told that they have known to this couple for 30 years and they have together property and businesses.  However his wife does not like talking to this woman.  Patient realizes he may need to go back counseling for himself even though his wife does not agree for marriage counseling.  He does not want to change medication.  He is sleeping good.  He gets some time frustrated but denies any irritability, mania, psychoses or any hallucination.  He is sleeping good.  He denies any feeling of hopelessness or any suicidal thoughts.  He has no rash, itching, tremors or shakes.  He denies any hallucination or any paranoia.  His energy level is okay.   Visit Diagnosis:    ICD-10-CM   1. Moderate episode of recurrent major depressive disorder (HCC) F33.1 traZODone (DESYREL) 100 MG tablet    lamoTRIgine (LAMICTAL) 150 MG tablet    buPROPion (WELLBUTRIN XL) 150 MG 24 hr tablet    Past Psychiatric History: Reviewed. Patient has history of depression for almost 20 years. In the past he has tried Cymbalta, Klonopin and Prestiq. Patient denies any history of mania or psychosis. He denies any history of psychiatric inpatient treatment or any suicidal attempt.  We tried increasing Lamictal but he developed itching and we reduced the dose.  Past Medical History:  Past Medical History:  Diagnosis Date  . Allergic rhinitis   . Depression   .  Family hx of colon cancer   . Family hx of melanoma   . Fatigue   . Gout   . Hx of colonic polyps   . Hyperlipidemia   . Hypertension   . Nephrolithiasis   . Other diseases of lung, not elsewhere classified   . Vitamin D deficiency     Past Surgical History:  Procedure Laterality Date  . MELANOMA EXCISION N/A 07/2016  . ROTATOR CUFF REPAIR Right 02/2016  . SKIN CANCER EXCISION      Family Psychiatric History: Reviewed.  Family History:  Family History  Problem Relation Age of Onset  . Colon cancer Father        Deceased, 63  . Stroke Father   . Melanoma Mother        Deceased, 13  . Hypertension Mother   . Diabetes Mellitus II Brother   . Stroke Brother        Deceased, 80  . Depression Son     Social History:  Social History   Socioeconomic History  . Marital status: Married    Spouse name: Not on file  . Number of children: Not on file  . Years of education: Not on file  . Highest education level: Not on file  Social Needs  . Financial resource strain: Not on file  . Food insecurity - worry: Not on file  . Food insecurity - inability: Not on file  . Transportation needs - medical: Not on file  .  Transportation needs - non-medical: Not on file  Occupational History  . Occupation: RETIRED  Tobacco Use  . Smoking status: Former Smoker    Last attempt to quit: 03/22/1980    Years since quitting: 36.8  . Smokeless tobacco: Never Used  Substance and Sexual Activity  . Alcohol use: Yes    Alcohol/week: 5.4 oz    Types: 3 Glasses of wine, 6 Cans of beer per week    Comment: 1 beer/mixed drink daily  . Drug use: No  . Sexual activity: Yes    Partners: Female    Birth control/protection: None  Other Topics Concern  . Not on file  Social History Narrative   Lives with wife.    Retired Forensic psychologist, Chief Strategy Officer.   Highest level of education:  JD    Allergies:  Allergies  Allergen Reactions  . Tetracycline Hcl     Metabolic Disorder Labs: No results  found for: HGBA1C, MPG No results found for: PROLACTIN No results found for: CHOL, TRIG, HDL, CHOLHDL, VLDL, LDLCALC Lab Results  Component Value Date   TSH 1.728 06/25/2014    Therapeutic Level Labs: No results found for: LITHIUM No results found for: VALPROATE No components found for:  CBMZ  Current Medications: Current Outpatient Medications  Medication Sig Dispense Refill  . allopurinol (ZYLOPRIM) 300 MG tablet Take 300 mg by mouth daily.      Marland Kitchen amLODipine (NORVASC) 5 MG tablet     . aspirin 81 MG tablet Take 81 mg by mouth daily.      Marland Kitchen buPROPion (WELLBUTRIN XL) 150 MG 24 hr tablet TAKE ONE TABLET BY MOUTH ONCE DAILY IN THE MORNING 90 tablet 0  . carvedilol (COREG) 6.25 MG tablet     . Cetirizine HCl 10 MG CAPS Take by mouth.    . lamoTRIgine (LAMICTAL) 150 MG tablet Take 1 tablet (150 mg total) by mouth daily. 90 tablet 0  . LORazepam (ATIVAN) 0.5 MG tablet Take 1 tablet (0.5 mg total) by mouth daily as needed for anxiety. 20 tablet 0  . omeprazole (PRILOSEC) 20 MG capsule     . simvastatin (ZOCOR) 40 MG tablet Take 40 mg by mouth daily.    . traZODone (DESYREL) 100 MG tablet Take 2 tablets (200 mg total) by mouth at bedtime. 180 tablet 0  . triamterene-hydrochlorothiazide (MAXZIDE-25) 37.5-25 MG tablet     . Vitamin D, Ergocalciferol, (DRISDOL) 50000 UNITS CAPS capsule Take by mouth.     No current facility-administered medications for this visit.      Musculoskeletal: Strength & Muscle Tone: within normal limits Gait & Station: normal Patient leans: N/A  Psychiatric Specialty Exam: ROS  Blood pressure 126/72, pulse 76, height 5\' 9"  (1.753 m), weight 151 lb 3.2 oz (68.6 kg).There is no height or weight on file to calculate BMI.  General Appearance: Casual  Eye Contact:  Good  Speech:  Clear and Coherent  Volume:  Normal  Mood:  Anxious  Affect:  Appropriate  Thought Process:  Goal Directed  Orientation:  Full (Time, Place, and Person)  Thought Content:  Rumination   Suicidal Thoughts:  No  Homicidal Thoughts:  No  Memory:  Immediate;   Good Recent;   Good Remote;   Good  Judgement:  Good  Insight:  Good  Psychomotor Activity:  Normal  Concentration:  Concentration: Good and Attention Span: Fair  Recall:  [place] of Knowledge: Good  Language: Good  Akathisia:  No  Handed:  Right  AIMS (  if indicated): not done  Assets:  Communication Skills Desire for Improvement Resilience  ADL's:  Intact  Cognition: WNL  Sleep:  Good   Screenings:   Assessment and Plan: Major depressive disorder, recurrent.  Reassurance given.  Reinforced marriage counseling and even though if wife does not agree then he should see a therapist for himself.  Patient does not want to change his medication.  Continue Lamictal 150 mg daily, Wellbutrin XL 150 mg daily and trazodone 200 mg at bedtime.  He has not used lorazepam in the past 6 months.  Discussed medication side effects and benefits.  Discussed safety concerns at any time having active suicidal thoughts or homicidal thought that he need to call 911 or go to local emergency room.  Follow-up in 3 months.   Colin Pua T., MD 02/01/2017, 9:58 AM

## 2017-02-03 DIAGNOSIS — D034 Melanoma in situ of scalp and neck: Secondary | ICD-10-CM | POA: Diagnosis not present

## 2017-02-15 DIAGNOSIS — H1045 Other chronic allergic conjunctivitis: Secondary | ICD-10-CM | POA: Diagnosis not present

## 2017-02-15 DIAGNOSIS — H2513 Age-related nuclear cataract, bilateral: Secondary | ICD-10-CM | POA: Diagnosis not present

## 2017-02-15 DIAGNOSIS — H04123 Dry eye syndrome of bilateral lacrimal glands: Secondary | ICD-10-CM | POA: Diagnosis not present

## 2017-02-18 DIAGNOSIS — L723 Sebaceous cyst: Secondary | ICD-10-CM | POA: Diagnosis not present

## 2017-02-23 DIAGNOSIS — R351 Nocturia: Secondary | ICD-10-CM | POA: Diagnosis not present

## 2017-02-23 DIAGNOSIS — N401 Enlarged prostate with lower urinary tract symptoms: Secondary | ICD-10-CM | POA: Diagnosis not present

## 2017-02-25 DIAGNOSIS — G5762 Lesion of plantar nerve, left lower limb: Secondary | ICD-10-CM | POA: Diagnosis not present

## 2017-02-25 DIAGNOSIS — M79632 Pain in left forearm: Secondary | ICD-10-CM | POA: Diagnosis not present

## 2017-03-02 DIAGNOSIS — R351 Nocturia: Secondary | ICD-10-CM | POA: Diagnosis not present

## 2017-03-02 DIAGNOSIS — N401 Enlarged prostate with lower urinary tract symptoms: Secondary | ICD-10-CM | POA: Diagnosis not present

## 2017-03-02 DIAGNOSIS — N486 Induration penis plastica: Secondary | ICD-10-CM | POA: Diagnosis not present

## 2017-03-29 DIAGNOSIS — L720 Epidermal cyst: Secondary | ICD-10-CM | POA: Diagnosis not present

## 2017-03-29 DIAGNOSIS — L723 Sebaceous cyst: Secondary | ICD-10-CM | POA: Diagnosis not present

## 2017-03-29 DIAGNOSIS — R229 Localized swelling, mass and lump, unspecified: Secondary | ICD-10-CM | POA: Diagnosis not present

## 2017-03-29 DIAGNOSIS — L989 Disorder of the skin and subcutaneous tissue, unspecified: Secondary | ICD-10-CM | POA: Diagnosis not present

## 2017-04-25 ENCOUNTER — Telehealth (HOSPITAL_COMMUNITY): Payer: Self-pay

## 2017-04-25 NOTE — Telephone Encounter (Signed)
Patient is calling to get a recommendation for couples therapy. He would like to know who you would recommend. He asked that you call him at [phone]

## 2017-04-27 DIAGNOSIS — M545 Low back pain: Secondary | ICD-10-CM | POA: Diagnosis not present

## 2017-04-27 DIAGNOSIS — M25561 Pain in right knee: Secondary | ICD-10-CM | POA: Diagnosis not present

## 2017-04-27 DIAGNOSIS — M2391 Unspecified internal derangement of right knee: Secondary | ICD-10-CM | POA: Diagnosis not present

## 2017-04-27 DIAGNOSIS — M5136 Other intervertebral disc degeneration, lumbar region: Secondary | ICD-10-CM | POA: Diagnosis not present

## 2017-04-27 NOTE — Telephone Encounter (Signed)
Please give him Dr. Jenness Corner contact nformation.  I do not know any therapist who practice in his living area.

## 2017-04-27 NOTE — Telephone Encounter (Signed)
I called the patient and gave him the number to [facility] and to Dr. Joaquin Music

## 2017-05-03 ENCOUNTER — Other Ambulatory Visit (HOSPITAL_COMMUNITY): Payer: Self-pay | Admitting: Psychiatry

## 2017-05-03 DIAGNOSIS — F331 Major depressive disorder, recurrent, moderate: Secondary | ICD-10-CM

## 2017-05-04 ENCOUNTER — Encounter (HOSPITAL_COMMUNITY): Payer: Self-pay | Admitting: Psychiatry

## 2017-05-04 ENCOUNTER — Ambulatory Visit (INDEPENDENT_AMBULATORY_CARE_PROVIDER_SITE_OTHER): Payer: Medicare Other | Admitting: Psychiatry

## 2017-05-04 DIAGNOSIS — Z818 Family history of other mental and behavioral disorders: Secondary | ICD-10-CM | POA: Diagnosis not present

## 2017-05-04 DIAGNOSIS — F1099 Alcohol use, unspecified with unspecified alcohol-induced disorder: Secondary | ICD-10-CM

## 2017-05-04 DIAGNOSIS — F419 Anxiety disorder, unspecified: Secondary | ICD-10-CM

## 2017-05-04 DIAGNOSIS — G4700 Insomnia, unspecified: Secondary | ICD-10-CM | POA: Diagnosis not present

## 2017-05-04 DIAGNOSIS — R450 Nervousness: Secondary | ICD-10-CM

## 2017-05-04 DIAGNOSIS — Z87891 Personal history of nicotine dependence: Secondary | ICD-10-CM

## 2017-05-04 DIAGNOSIS — F331 Major depressive disorder, recurrent, moderate: Secondary | ICD-10-CM

## 2017-05-04 DIAGNOSIS — Z6379 Other stressful life events affecting family and household: Secondary | ICD-10-CM | POA: Diagnosis not present

## 2017-05-04 DIAGNOSIS — Z630 Problems in relationship with spouse or partner: Secondary | ICD-10-CM

## 2017-05-04 MED ORDER — LAMOTRIGINE 150 MG PO TABS: 150.0000 mg | ORAL_TABLET | Freq: Every day | ORAL | 0 refills | Status: DC

## 2017-05-04 MED ORDER — TRAZODONE HCL 100 MG PO TABS: 200.0000 mg | ORAL_TABLET | Freq: Every day | ORAL | 0 refills | Status: DC

## 2017-05-04 NOTE — Progress Notes (Signed)
[place] [facility] MD/PA/NP OP Progress Note  05/04/2017 10:14 AM Colin Galvan.  MRN:  [id number]  Chief Complaint: I am not doing very well.  My marriage life is falling apart.  HPI: Patient came for his follow-up appointment.  Is very stressed and anxious about his marital life.  He mention his wife has been more aggressive and unpredictable.  He mention few incidents that make him very nervous.  He told 2 days ago after an argument with the wife he went to sleep early at 26 PM but his wife woke him at 4 AM in the morning and start arguing with him.  He also told his wife hide her car keys, telephone and his trazodone medication and he was very upset.  Patient told she did so he cannot leave the house.  He is very concerned about her wife's behavior.  He tried to convince to see a psychiatrist and therapist but his wife refused.  He is not sure what to do.  He is thinking divorce but he also feeling very guilty because he loves his wife.  Patient admitted that he has cut down her socialization to please her but nothing is working.  They have been married for 22 years.  Patient has a biological son from his first marriage who lives in [place].  Patient has a brother who lives in [place].  He has 2 stepdaughter and patient is very close to them who lives in [place].  He tried to involve the daughter but also he does not want to disturb them.  He admitted some nights poor sleep, irritability and frustration.  Patient does not want to change his medication.  He is not interested in counseling because he believe if his wife does not agree that there is no point for counseling.  Patient admitted sometimes feeling hopeless helpless and worthless but denies any suicidal thoughts or homicidal thought.  Denies any agitation, paranoia or any hallucination.  Patient denies drinking alcohol or using any illegal substances.  His energy level is fair.  He has no itching, tremors, shakes or any EPS.  Visit Diagnosis:     ICD-10-CM   1. Moderate episode of recurrent major depressive disorder (HCC) F33.1 traZODone (DESYREL) 100 MG tablet    lamoTRIgine (LAMICTAL) 150 MG tablet    Past Psychiatric History: Reviewed. Patient has history of depression for almost 20 years. In the past he has tried Cymbalta, Klonopin and Prestiq. Patient denies any history of mania or psychosis. He denies any history of psychiatric inpatient treatment or any suicidal attempt.We tried increasing Lamictal but he developed itching and we reduced the dose.  Past Medical History:  Past Medical History:  Diagnosis Date  . Allergic rhinitis   . Depression   . Family hx of colon cancer   . Family hx of melanoma   . Fatigue   . Gout   . Hx of colonic polyps   . Hyperlipidemia   . Hypertension   . Nephrolithiasis   . Other diseases of lung, not elsewhere classified   . Vitamin D deficiency     Past Surgical History:  Procedure Laterality Date  . MELANOMA EXCISION N/A 07/2016  . ROTATOR CUFF REPAIR Right 02/2016  . SKIN CANCER EXCISION      Family Psychiatric History: Reviewed.  Family History:  Family History  Problem Relation Age of Onset  . Colon cancer Father        Deceased, 37  . Stroke Father   . Melanoma  Mother        Deceased, 45  . Hypertension Mother   . Diabetes Mellitus II Brother   . Stroke Brother        Deceased, 25  . Depression Son     Social History:  Social History   Socioeconomic History  . Marital status: Married    Spouse name: None  . Number of children: None  . Years of education: None  . Highest education level: None  Social Needs  . Financial resource strain: None  . Food insecurity - worry: None  . Food insecurity - inability: None  . Transportation needs - medical: None  . Transportation needs - non-medical: None  Occupational History  . Occupation: RETIRED  Tobacco Use  . Smoking status: Former Smoker    Last attempt to quit: 03/22/1980    Years since quitting: 37.1   . Smokeless tobacco: Never Used  Substance and Sexual Activity  . Alcohol use: Yes    Alcohol/week: 5.4 oz    Types: 3 Glasses of wine, 6 Cans of beer per week    Comment: 1 beer/mixed drink daily  . Drug use: No  . Sexual activity: Yes    Partners: Female    Birth control/protection: None  Other Topics Concern  . None  Social History Narrative   Lives with wife.    Retired Forensic psychologist, Chief Strategy Officer.   Highest level of education:  JD    Allergies:  Allergies  Allergen Reactions  . Tetracycline Hcl     Metabolic Disorder Labs: No results found for: HGBA1C, MPG No results found for: PROLACTIN No results found for: CHOL, TRIG, HDL, CHOLHDL, VLDL, LDLCALC Lab Results  Component Value Date   TSH 1.728 06/25/2014    Therapeutic Level Labs: No results found for: LITHIUM No results found for: VALPROATE No components found for:  CBMZ  Current Medications: Current Outpatient Medications  Medication Sig Dispense Refill  . allopurinol (ZYLOPRIM) 300 MG tablet Take 300 mg by mouth daily.      Marland Kitchen amLODipine (NORVASC) 5 MG tablet     . aspirin 81 MG tablet Take 81 mg by mouth daily.      Marland Kitchen buPROPion (WELLBUTRIN XL) 150 MG 24 hr tablet TAKE ONE TABLET BY MOUTH ONCE DAILY IN THE MORNING 90 tablet 0  . carvedilol (COREG) 6.25 MG tablet     . lamoTRIgine (LAMICTAL) 150 MG tablet Take 1 tablet (150 mg total) daily by mouth. 90 tablet 0  . LORazepam (ATIVAN) 0.5 MG tablet Take 1 tablet (0.5 mg total) by mouth daily as needed for anxiety. 20 tablet 0  . omeprazole (PRILOSEC) 20 MG capsule     . simvastatin (ZOCOR) 40 MG tablet Take 40 mg by mouth daily.    . traZODone (DESYREL) 100 MG tablet Take 2 tablets (200 mg total) at bedtime by mouth. 180 tablet 0  . triamterene-hydrochlorothiazide (MAXZIDE-25) 37.5-25 MG tablet     . Vitamin D, Ergocalciferol, (DRISDOL) 50000 UNITS CAPS capsule Take by mouth.    . Cetirizine HCl 10 MG CAPS Take by mouth.     No current facility-administered  medications for this visit.      Musculoskeletal: Strength & Muscle Tone: within normal limits Gait & Station: normal Patient leans: N/A  Psychiatric Specialty Exam: Review of Systems  Constitutional: Negative.   HENT: Negative.   Cardiovascular: Negative.   Genitourinary: Negative.   Skin: Negative.   Neurological: Negative.   Psychiatric/Behavioral: Positive for depression. The patient is  nervous/anxious and has insomnia.     Blood pressure 118/74, pulse 78, height 5\' 9"  (1.753 m), weight 149 lb 3.2 oz (67.7 kg).Body mass index is 22.03 kg/m.  General Appearance: Casual  Eye Contact:  Good  Speech:  Slow  Volume:  Decreased  Mood:  Anxious, Depressed and Dysphoric  Affect:  Constricted  Thought Process:  Goal Directed  Orientation:  Full (Time, Place, and Person)  Thought Content: Rumination   Suicidal Thoughts:  No  Homicidal Thoughts:  No  Memory:  Immediate;   Good Recent;   Good Remote;   Good  Judgement:  Good  Insight:  Good  Psychomotor Activity:  Decreased  Concentration:  Concentration: Fair and Attention Span: Fair  Recall:  Good  Fund of Knowledge: Good  Language: Good  Akathisia:  No  Handed:  Right  AIMS (if indicated): not done  Assets:  Communication Skills Desire for Improvement Resilience  ADL's:  Intact  Cognition: WNL  Sleep:  Fair   Screenings:   Assessment and Plan: Major depressive disorder, recurrent.  Anxiety disorder.  I have a long discussion with the patient about his marital stress.  Patient is frustrated because his wife does not take the responsibility and she is in a denial that she need help.  We talked about her risky behavior including hiding his medication.  I suggested that patient should remove the firearms from his house until situation resolved.  I also suggested that he should contact her step daughter who lives in [place] and explained about her current situation.  Patient told his wife is close to her daughter and  may be her daughter can convince patient's wife to see psychiatrist.  I also recommended that he should temporarily get separation until things get cool off.  Patient wants to get divorced but he is also feels guilty because he loves his wife.  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  I strongly encourage to see a therapist but patient does not seems to be interested in counseling at this time.  He also very reluctant to change or add any medication.  However I strongly encouraged if he feels very nervous and anxious than he should use leftover lorazepam 0.5 mg as needed.  Patient agreed with the plan.  We discussed in detail safety concern that any time he feels that his life in danger or if he have any suicidal thoughts then he should call 911 or go to local emergency room.  Continue Wellbutrin XL 150 mg daily and Lamictal 150 mg daily and trazodone 200 mg at bedtime.  Follow-up in 4 weeks.  Recommended to call us back if he has any question, concern if he feels worsening of the symptoms.  Time spent 25 minutes.  More than 50% of the time spent in psychoeducation, counseling, long-term prognosis and discuss his current life situation and possible remedies and plan for the future.   Kathlee Nations, MD 05/04/2017, 10:14 AM

## 2017-05-09 DIAGNOSIS — H02834 Dermatochalasis of left upper eyelid: Secondary | ICD-10-CM | POA: Diagnosis not present

## 2017-05-09 DIAGNOSIS — H2513 Age-related nuclear cataract, bilateral: Secondary | ICD-10-CM | POA: Diagnosis not present

## 2017-05-09 DIAGNOSIS — H04123 Dry eye syndrome of bilateral lacrimal glands: Secondary | ICD-10-CM | POA: Diagnosis not present

## 2017-05-09 DIAGNOSIS — H02831 Dermatochalasis of right upper eyelid: Secondary | ICD-10-CM | POA: Diagnosis not present

## 2017-05-11 DIAGNOSIS — M25561 Pain in right knee: Secondary | ICD-10-CM | POA: Diagnosis not present

## 2017-05-11 DIAGNOSIS — M545 Low back pain: Secondary | ICD-10-CM | POA: Diagnosis not present

## 2017-05-11 DIAGNOSIS — M5136 Other intervertebral disc degeneration, lumbar region: Secondary | ICD-10-CM | POA: Diagnosis not present

## 2017-05-11 DIAGNOSIS — M2391 Unspecified internal derangement of right knee: Secondary | ICD-10-CM | POA: Diagnosis not present

## 2017-05-25 DIAGNOSIS — M5416 Radiculopathy, lumbar region: Secondary | ICD-10-CM | POA: Diagnosis not present

## 2017-05-25 DIAGNOSIS — M5136 Other intervertebral disc degeneration, lumbar region: Secondary | ICD-10-CM | POA: Diagnosis not present

## 2017-06-08 ENCOUNTER — Ambulatory Visit (INDEPENDENT_AMBULATORY_CARE_PROVIDER_SITE_OTHER): Payer: Medicare Other | Admitting: Psychiatry

## 2017-06-08 ENCOUNTER — Encounter (HOSPITAL_COMMUNITY): Payer: Self-pay | Admitting: Psychiatry

## 2017-06-08 ENCOUNTER — Other Ambulatory Visit (HOSPITAL_COMMUNITY): Payer: Self-pay | Admitting: Psychiatry

## 2017-06-08 DIAGNOSIS — Z630 Problems in relationship with spouse or partner: Secondary | ICD-10-CM | POA: Diagnosis not present

## 2017-06-08 DIAGNOSIS — F1099 Alcohol use, unspecified with unspecified alcohol-induced disorder: Secondary | ICD-10-CM | POA: Diagnosis not present

## 2017-06-08 DIAGNOSIS — Z818 Family history of other mental and behavioral disorders: Secondary | ICD-10-CM | POA: Diagnosis not present

## 2017-06-08 DIAGNOSIS — F331 Major depressive disorder, recurrent, moderate: Secondary | ICD-10-CM

## 2017-06-08 DIAGNOSIS — Z87891 Personal history of nicotine dependence: Secondary | ICD-10-CM

## 2017-06-08 DIAGNOSIS — F419 Anxiety disorder, unspecified: Secondary | ICD-10-CM | POA: Diagnosis not present

## 2017-06-08 NOTE — Progress Notes (Signed)
[facility] MD/PA/NP OP Progress Note  06/08/2017 1:08 PM Colin Galvan.  MRN:  [id number]  Chief Complaint: I am feeling better from the last visit.  My wife finally agreed to see psychiatrist.  HPI: Patient came for his follow-up appointment.  He was seen 4 weeks ago and at that time he was very stressed and anxious about his marriage life.  Patient told things are somewhat better but 10 days ago his wife has another incident when she became very argumentative agitated and patient became very frustrated.  Patient is pleased that his wife finally agreed to see psychiatrist.  She had appointment with this writer however due to comfort of interest appointment was moved to a different psychiatrist.  She is scheduled to see Dr Dwaine Gale.  Patient told that there were no more incident about hiding his medication by his wife.  Patient does not want to divorce her because he still loves her and want to keep the marriage.  Patient does not want to see marriage counselor because they did tried in the past and did not work.  However patient contact wife's daughter who is been very supportive and wife is talking to the daughter on a regular basis.  Patient endorsed there has been time when he wanted to leave the house but did not left to create more problems.  He has not taken any Ativan since the last visit.  Patient denies any feeling of hopelessness or worthlessness.  He denies any suicidal thoughts or homicidal thought.  He does not want to change or add any more medication.  His energy level is fair.  He is sleeping okay.  He has no rash, itching or tremors.  Patient denies drinking alcohol or using any illegal substances.  Visit Diagnosis:    ICD-10-CM   1. Moderate episode of recurrent major depressive disorder (HCC) F33.1     Past Psychiatric History: Reviewed. Patient has history of depression for almost 20 years. In the past he has tried Cymbalta, Klonopin and Prestiq. Patient denies any history  of mania or psychosis. He denies any history of psychiatric inpatient treatment or any suicidal attempt.We tried increasing Lamictal but he developed itching and we reduced the dose.  Past Medical History:  Past Medical History:  Diagnosis Date  . Allergic rhinitis   . Depression   . Family hx of colon cancer   . Family hx of melanoma   . Fatigue   . Gout   . Hx of colonic polyps   . Hyperlipidemia   . Hypertension   . Nephrolithiasis   . Other diseases of lung, not elsewhere classified   . Vitamin D deficiency     Past Surgical History:  Procedure Laterality Date  . MELANOMA EXCISION N/A 07/2016  . ROTATOR CUFF REPAIR Right 02/2016  . SKIN CANCER EXCISION      Family Psychiatric History: Viewed.  Family History:  Family History  Problem Relation Age of Onset  . Colon cancer Father        Deceased, 68  . Stroke Father   . Melanoma Mother        Deceased, 67  . Hypertension Mother   . Diabetes Mellitus II Brother   . Stroke Brother        Deceased, 48  . Depression Son     Social History:  Social History   Socioeconomic History  . Marital status: Married    Spouse name: Not on file  . Number of children:  Not on file  . Years of education: Not on file  . Highest education level: Not on file  Social Needs  . Financial resource strain: Not on file  . Food insecurity - worry: Not on file  . Food insecurity - inability: Not on file  . Transportation needs - medical: Not on file  . Transportation needs - non-medical: Not on file  Occupational History  . Occupation: RETIRED  Tobacco Use  . Smoking status: Former Smoker    Last attempt to quit: 03/22/1980    Years since quitting: 37.2  . Smokeless tobacco: Never Used  Substance and Sexual Activity  . Alcohol use: Yes    Alcohol/week: 5.4 oz    Types: 3 Glasses of wine, 6 Cans of beer per week    Comment: 1 beer/mixed drink daily  . Drug use: No  . Sexual activity: Yes    Partners: Female    Birth  control/protection: None  Other Topics Concern  . Not on file  Social History Narrative   Lives with wife.    Retired Forensic psychologist, Chief Strategy Officer.   Highest level of education:  JD    Allergies:  Allergies  Allergen Reactions  . Tetracycline Hcl     Metabolic Disorder Labs: No results found for: HGBA1C, MPG No results found for: PROLACTIN No results found for: CHOL, TRIG, HDL, CHOLHDL, VLDL, LDLCALC Lab Results  Component Value Date   TSH 1.728 06/25/2014    Therapeutic Level Labs: No results found for: LITHIUM No results found for: VALPROATE No components found for:  CBMZ  Current Medications: Current Outpatient Medications  Medication Sig Dispense Refill  . allopurinol (ZYLOPRIM) 300 MG tablet Take 300 mg by mouth daily.      Marland Kitchen amLODipine (NORVASC) 5 MG tablet     . aspirin 81 MG tablet Take 81 mg by mouth daily.      Marland Kitchen buPROPion (WELLBUTRIN XL) 150 MG 24 hr tablet TAKE ONE TABLET BY MOUTH ONCE DAILY IN THE MORNING 90 tablet 0  . carvedilol (COREG) 6.25 MG tablet     . Cetirizine HCl 10 MG CAPS Take by mouth.    . lamoTRIgine (LAMICTAL) 150 MG tablet Take 1 tablet (150 mg total) by mouth daily. 90 tablet 0  . LORazepam (ATIVAN) 0.5 MG tablet Take 1 tablet (0.5 mg total) by mouth daily as needed for anxiety. 20 tablet 0  . omeprazole (PRILOSEC) 20 MG capsule     . simvastatin (ZOCOR) 40 MG tablet Take 40 mg by mouth daily.    . traZODone (DESYREL) 100 MG tablet Take 2 tablets (200 mg total) by mouth at bedtime. 180 tablet 0  . triamterene-hydrochlorothiazide (MAXZIDE-25) 37.5-25 MG tablet     . Vitamin D, Ergocalciferol, (DRISDOL) 50000 UNITS CAPS capsule Take by mouth.     No current facility-administered medications for this visit.      Musculoskeletal: Strength & Muscle Tone: within normal limits Gait & Station: normal Patient leans: N/A  Psychiatric Specialty Exam: ROS  Blood pressure 128/74, pulse 89, height 5\' 9"  (1.753 m), weight 149 lb (67.6 kg).There is  no height or weight on file to calculate BMI.  General Appearance: Casual  Eye Contact:  Good  Speech:  Clear and Coherent  Volume:  Normal  Mood:  Anxious and Dysphoric  Affect:  Constricted  Thought Process:  Goal Directed  Orientation:  Full (Time, Place, and Person)  Thought Content: Rumination   Suicidal Thoughts:  No  Homicidal Thoughts:  No  Memory:  Immediate;   Good Recent;   Good Remote;   Fair  Judgement:  Good  Insight:  Good  Psychomotor Activity:  Decreased  Concentration:  Concentration: Fair and Attention Span: Fair  Recall:  [employer] of Knowledge: Good  Language: Good  Akathisia:  No  Handed:  Right  AIMS (if indicated): not done  Assets:  Communication Skills Desire for Improvement Housing Resilience  ADL's:  Intact  Cognition: WNL  Sleep:  Improved.   Screenings:   Assessment and Plan: Major depressive disorder, recurrent.  Anxiety disorder NOS.  Marital issues.  Patient doing better since the last visit since wife agreed to see a psychiatrist.  She will see Dr. Wadie Lessen tomorrow.  Patient does not want to change his medication.  He like to be as supportive to keep the marriage.  Patient is reluctant to see a marriage counselor because he tried in the past with poor outcome.  He is hoping his wife see psychiatrist and medication adjustment may help her.  Reminded the goal we discussed that any time patient have any suicidal thoughts or homicidal thoughts then he need to call 911 or go to local emergency room.  Patient will continue in touch with patient's daughter who is been very supportive.  I will see him again in 4 weeks.  Patient will continue Wellbutrin XL 150 mg daily, Lamictal 150 mg daily and trazodone 200 mg at bedtime.  Patient has no rash, itching, tremors or shakes.  His appetite is okay.  His vital signs are stable.   Kathlee Nations, MD 06/08/2017, 1:08 PM

## 2017-06-09 ENCOUNTER — Other Ambulatory Visit (HOSPITAL_COMMUNITY): Payer: Self-pay | Admitting: Psychiatry

## 2017-06-09 DIAGNOSIS — F331 Major depressive disorder, recurrent, moderate: Secondary | ICD-10-CM

## 2017-06-09 MED ORDER — BUPROPION HCL ER (XL) 150 MG PO TB24: ORAL_TABLET | ORAL | 0 refills | Status: DC

## 2017-06-16 ENCOUNTER — Ambulatory Visit: Payer: Medicare Other | Admitting: Psychology

## 2017-06-28 DIAGNOSIS — M5416 Radiculopathy, lumbar region: Secondary | ICD-10-CM | POA: Diagnosis not present

## 2017-06-28 DIAGNOSIS — M5136 Other intervertebral disc degeneration, lumbar region: Secondary | ICD-10-CM | POA: Diagnosis not present

## 2017-06-29 ENCOUNTER — Ambulatory Visit (INDEPENDENT_AMBULATORY_CARE_PROVIDER_SITE_OTHER): Payer: Medicare Other | Admitting: Psychology

## 2017-06-29 DIAGNOSIS — F4322 Adjustment disorder with anxiety: Secondary | ICD-10-CM

## 2017-07-05 DIAGNOSIS — L219 Seborrheic dermatitis, unspecified: Secondary | ICD-10-CM | POA: Diagnosis not present

## 2017-07-05 DIAGNOSIS — Z85820 Personal history of malignant melanoma of skin: Secondary | ICD-10-CM | POA: Diagnosis not present

## 2017-07-05 DIAGNOSIS — L570 Actinic keratosis: Secondary | ICD-10-CM | POA: Diagnosis not present

## 2017-07-05 DIAGNOSIS — Z85828 Personal history of other malignant neoplasm of skin: Secondary | ICD-10-CM | POA: Diagnosis not present

## 2017-07-08 DIAGNOSIS — M5416 Radiculopathy, lumbar region: Secondary | ICD-10-CM | POA: Diagnosis not present

## 2017-07-13 ENCOUNTER — Encounter (HOSPITAL_COMMUNITY): Payer: Self-pay | Admitting: Psychiatry

## 2017-07-13 ENCOUNTER — Ambulatory Visit (INDEPENDENT_AMBULATORY_CARE_PROVIDER_SITE_OTHER): Payer: Medicare Other | Admitting: Psychology

## 2017-07-13 ENCOUNTER — Ambulatory Visit (INDEPENDENT_AMBULATORY_CARE_PROVIDER_SITE_OTHER): Payer: Medicare Other | Admitting: Psychiatry

## 2017-07-13 DIAGNOSIS — F4322 Adjustment disorder with anxiety: Secondary | ICD-10-CM | POA: Diagnosis not present

## 2017-07-13 DIAGNOSIS — F1010 Alcohol abuse, uncomplicated: Secondary | ICD-10-CM | POA: Diagnosis not present

## 2017-07-13 DIAGNOSIS — Z87891 Personal history of nicotine dependence: Secondary | ICD-10-CM | POA: Diagnosis not present

## 2017-07-13 DIAGNOSIS — Z818 Family history of other mental and behavioral disorders: Secondary | ICD-10-CM | POA: Diagnosis not present

## 2017-07-13 DIAGNOSIS — F331 Major depressive disorder, recurrent, moderate: Secondary | ICD-10-CM | POA: Diagnosis not present

## 2017-07-13 DIAGNOSIS — F411 Generalized anxiety disorder: Secondary | ICD-10-CM

## 2017-07-13 DIAGNOSIS — Z630 Problems in relationship with spouse or partner: Secondary | ICD-10-CM

## 2017-07-13 MED ORDER — LAMOTRIGINE 150 MG PO TABS: 150.0000 mg | ORAL_TABLET | Freq: Every day | ORAL | 0 refills | Status: DC

## 2017-07-13 MED ORDER — TRAZODONE HCL 100 MG PO TABS: 200.0000 mg | ORAL_TABLET | Freq: Every day | ORAL | 0 refills | Status: DC

## 2017-07-13 MED ORDER — BUPROPION HCL ER (XL) 150 MG PO TB24: ORAL_TABLET | ORAL | 0 refills | Status: DC

## 2017-07-13 NOTE — Progress Notes (Signed)
[facility] MD/PA/NP OP Progress Note  07/13/2017 10:51 AM Colin Galvan.  MRN:  [id number]  Chief Complaint: My marital issues are the same.  I am thinking to move out.  HPI: Patient came for her follow-up appointment.  He endorsed that his marriage remain the same and he continued to have issues in his marriage.  His wife saw psychiatrist but he was frustrated because there was no change in the medication and a follow-up appointment is in 3 months.  Patient told her wife psychiatrist recommended to see a therapist but no one is available.  Patient continued to have frustration and irritability and continued to have argument with the wife.  Now he is seriously thinking to move out and he is searching for apartment if needed.  Recently he called behavioral health center because he believes his wife is in crisis and he was told to consider IOP.  He is hoping his wife consider IOP.  Patient does not want to change his medication.  He is sleeping on and off.  He denies any mania, psychosis, hallucination or any paranoia.  He denies any suicidal thoughts or any feeling of hopelessness or worthlessness.  He admitted his anxiety and irritability due to situation and he does not want to increase or change the medication.  His appetite is okay.  His vital signs are stable.  He has no tremors, rash, itching or any cheeks.  Visit Diagnosis:    ICD-10-CM   1. Moderate episode of recurrent major depressive disorder (HCC) F33.1 traZODone (DESYREL) 100 MG tablet    lamoTRIgine (LAMICTAL) 150 MG tablet    buPROPion (WELLBUTRIN XL) 150 MG 24 hr tablet    Past Psychiatric History: Reviewed with Patient has history of depression for almost 20 years. In the past he has tried Cymbalta, Klonopin and Prestiq. Patient denies any history of mania or psychosis. He denies any history of psychiatric inpatient treatment or any suicidal attempt.We tried increasing Lamictal but he developed itching and we reduced the  dose.  Past Medical History:  Past Medical History:  Diagnosis Date  . Allergic rhinitis   . Depression   . Family hx of colon cancer   . Family hx of melanoma   . Fatigue   . Gout   . Hx of colonic polyps   . Hyperlipidemia   . Hypertension   . Nephrolithiasis   . Other diseases of lung, not elsewhere classified   . Vitamin D deficiency     Past Surgical History:  Procedure Laterality Date  . MELANOMA EXCISION N/A 07/2016  . ROTATOR CUFF REPAIR Right 02/2016  . SKIN CANCER EXCISION      Family Psychiatric History: Reviewed  Family History:  Family History  Problem Relation Age of Onset  . Colon cancer Father        Deceased, 50  . Stroke Father   . Melanoma Mother        Deceased, 41  . Hypertension Mother   . Diabetes Mellitus II Brother   . Stroke Brother        Deceased, 66  . Depression Son     Social History:  Social History   Socioeconomic History  . Marital status: Married    Spouse name: Not on file  . Number of children: Not on file  . Years of education: Not on file  . Highest education level: Not on file  Occupational History  . Occupation: RETIRED  Social Needs  . Financial resource strain:  Not on file  . Food insecurity:    Worry: Not on file    Inability: Not on file  . Transportation needs:    Medical: Not on file    Non-medical: Not on file  Tobacco Use  . Smoking status: Former Smoker    Last attempt to quit: 03/22/1980    Years since quitting: 37.3  . Smokeless tobacco: Never Used  Substance and Sexual Activity  . Alcohol use: Yes    Alcohol/week: 5.4 oz    Types: 3 Glasses of wine, 6 Cans of beer per week    Comment: 1 beer/mixed drink daily  . Drug use: No  . Sexual activity: Yes    Partners: Female    Birth control/protection: None  Lifestyle  . Physical activity:    Days per week: Not on file    Minutes per session: Not on file  . Stress: Not on file  Relationships  . Social connections:    Talks on phone: Not on  file    Gets together: Not on file    Attends religious service: Not on file    Active member of club or organization: Not on file    Attends meetings of clubs or organizations: Not on file    Relationship status: Not on file  Other Topics Concern  . Not on file  Social History Narrative   Lives with wife.    Retired Forensic psychologist, Chief Strategy Officer.   Highest level of education:  JD    Allergies:  Allergies  Allergen Reactions  . Tetracycline Hcl     Metabolic Disorder Labs: No results found for: HGBA1C, MPG No results found for: PROLACTIN No results found for: CHOL, TRIG, HDL, CHOLHDL, VLDL, LDLCALC Lab Results  Component Value Date   TSH 1.728 06/25/2014    Therapeutic Level Labs: No results found for: LITHIUM No results found for: VALPROATE No components found for:  CBMZ  Current Medications: Current Outpatient Medications  Medication Sig Dispense Refill  . allopurinol (ZYLOPRIM) 300 MG tablet Take 300 mg by mouth daily.      Marland Kitchen amLODipine (NORVASC) 5 MG tablet     . aspirin 81 MG tablet Take 81 mg by mouth daily.      Marland Kitchen buPROPion (WELLBUTRIN XL) 150 MG 24 hr tablet TAKE 1 TABLET BY MOUTH ONCE DAILY IN THE MORNING 90 tablet 0  . carvedilol (COREG) 6.25 MG tablet     . lamoTRIgine (LAMICTAL) 150 MG tablet Take 1 tablet (150 mg total) by mouth daily. 90 tablet 0  . omeprazole (PRILOSEC) 20 MG capsule     . simvastatin (ZOCOR) 40 MG tablet Take 40 mg by mouth daily.    . traZODone (DESYREL) 100 MG tablet Take 2 tablets (200 mg total) by mouth at bedtime. 180 tablet 0  . triamterene-hydrochlorothiazide (MAXZIDE-25) 37.5-25 MG tablet     . Vitamin D, Ergocalciferol, (DRISDOL) 50000 UNITS CAPS capsule Take by mouth.    . Cetirizine HCl 10 MG CAPS Take by mouth.     No current facility-administered medications for this visit.      Musculoskeletal: Strength & Muscle Tone: within normal limits Gait & Station: normal Patient leans: N/A  Psychiatric Specialty Exam: ROS   There were no vitals taken for this visit.There is no height or weight on file to calculate BMI.  General Appearance: Casual  Eye Contact:  Good  Speech:  Clear and Coherent  Volume:  Normal  Mood:  Dysphoric  Affect:  Depressed  Thought Process:  Goal Directed  Orientation:  Full (Time, Place, and Person)  Thought Content: Rumination   Suicidal Thoughts:  No  Homicidal Thoughts:  No  Memory:  Immediate;   Good Recent;   Good Remote;   Good  Judgement:  Good  Insight:  Good  Psychomotor Activity:  Normal  Concentration:  Concentration: Fair and Attention Span: Fair  Recall:  Good  Fund of Knowledge: Good  Language: Good  Akathisia:  No  Handed:  Right  AIMS (if indicated): not done  Assets:  Communication Skills Desire for Improvement Housing Resilience  ADL's:  Intact  Cognition: WNL  Sleep:  Fair   Screenings:   Assessment and Plan: Major depressive disorder, recurrent.  Generalized anxiety disorder.  I reviewed his psychosocial stressors.  Patient admitted having marital issues and he like to keep his marriage but he is frustrated because his wife does not interested getting psychiatric help.  He is reluctant to see a marriage counselor because he had tried in the past with poor outcome.  He is hoping his wife consider IOP.  Patient told if she refused then he already had a plan to move out and get separated.  We discussed safety concerns at any time having active suicidal thoughts or homicidal thought that he need to call 911 or go to local emergency room.  I will continue Wellbutrin XL 150 mg daily, Lamictal 150 mg daily and trazodone 200 mg at bedtime.  Recommended to call us back if he has any question or any concerns.  Follow-up in 3 months.   Kathlee Nations, MD 07/13/2017, 10:51 AM

## 2017-07-14 DIAGNOSIS — M5136 Other intervertebral disc degeneration, lumbar region: Secondary | ICD-10-CM | POA: Diagnosis not present

## 2017-07-14 DIAGNOSIS — M5416 Radiculopathy, lumbar region: Secondary | ICD-10-CM | POA: Diagnosis not present

## 2017-07-18 DIAGNOSIS — M109 Gout, unspecified: Secondary | ICD-10-CM | POA: Diagnosis not present

## 2017-07-18 DIAGNOSIS — F3289 Other specified depressive episodes: Secondary | ICD-10-CM | POA: Diagnosis not present

## 2017-07-18 DIAGNOSIS — Z85820 Personal history of malignant melanoma of skin: Secondary | ICD-10-CM | POA: Diagnosis not present

## 2017-07-18 DIAGNOSIS — D729 Disorder of white blood cells, unspecified: Secondary | ICD-10-CM | POA: Diagnosis not present

## 2017-07-18 DIAGNOSIS — E785 Hyperlipidemia, unspecified: Secondary | ICD-10-CM | POA: Diagnosis not present

## 2017-07-18 DIAGNOSIS — I10 Essential (primary) hypertension: Secondary | ICD-10-CM | POA: Diagnosis not present

## 2017-07-18 DIAGNOSIS — J449 Chronic obstructive pulmonary disease, unspecified: Secondary | ICD-10-CM | POA: Diagnosis not present

## 2017-07-18 DIAGNOSIS — R591 Generalized enlarged lymph nodes: Secondary | ICD-10-CM | POA: Diagnosis not present

## 2017-07-18 DIAGNOSIS — N182 Chronic kidney disease, stage 2 (mild): Secondary | ICD-10-CM | POA: Diagnosis not present

## 2017-07-18 DIAGNOSIS — N200 Calculus of kidney: Secondary | ICD-10-CM | POA: Diagnosis not present

## 2017-07-20 DIAGNOSIS — M5136 Other intervertebral disc degeneration, lumbar region: Secondary | ICD-10-CM | POA: Diagnosis not present

## 2017-07-20 DIAGNOSIS — M545 Low back pain: Secondary | ICD-10-CM | POA: Diagnosis not present

## 2017-07-20 DIAGNOSIS — M5416 Radiculopathy, lumbar region: Secondary | ICD-10-CM | POA: Diagnosis not present

## 2017-07-20 DIAGNOSIS — R0602 Shortness of breath: Secondary | ICD-10-CM | POA: Diagnosis not present

## 2017-07-26 DIAGNOSIS — R591 Generalized enlarged lymph nodes: Secondary | ICD-10-CM | POA: Diagnosis not present

## 2017-07-26 DIAGNOSIS — M5136 Other intervertebral disc degeneration, lumbar region: Secondary | ICD-10-CM | POA: Diagnosis not present

## 2017-07-26 DIAGNOSIS — M5416 Radiculopathy, lumbar region: Secondary | ICD-10-CM | POA: Diagnosis not present

## 2017-07-26 DIAGNOSIS — M545 Low back pain: Secondary | ICD-10-CM | POA: Diagnosis not present

## 2017-07-28 DIAGNOSIS — M5416 Radiculopathy, lumbar region: Secondary | ICD-10-CM | POA: Diagnosis not present

## 2017-07-28 DIAGNOSIS — M545 Low back pain: Secondary | ICD-10-CM | POA: Diagnosis not present

## 2017-07-28 DIAGNOSIS — M5136 Other intervertebral disc degeneration, lumbar region: Secondary | ICD-10-CM | POA: Diagnosis not present

## 2017-08-02 DIAGNOSIS — M545 Low back pain: Secondary | ICD-10-CM | POA: Diagnosis not present

## 2017-08-02 DIAGNOSIS — M5136 Other intervertebral disc degeneration, lumbar region: Secondary | ICD-10-CM | POA: Diagnosis not present

## 2017-08-02 DIAGNOSIS — M5416 Radiculopathy, lumbar region: Secondary | ICD-10-CM | POA: Diagnosis not present

## 2017-08-04 DIAGNOSIS — M5416 Radiculopathy, lumbar region: Secondary | ICD-10-CM | POA: Diagnosis not present

## 2017-08-04 DIAGNOSIS — M545 Low back pain: Secondary | ICD-10-CM | POA: Diagnosis not present

## 2017-08-04 DIAGNOSIS — M5136 Other intervertebral disc degeneration, lumbar region: Secondary | ICD-10-CM | POA: Diagnosis not present

## 2017-08-05 DIAGNOSIS — Z881 Allergy status to other antibiotic agents status: Secondary | ICD-10-CM | POA: Diagnosis not present

## 2017-08-05 DIAGNOSIS — E7800 Pure hypercholesterolemia, unspecified: Secondary | ICD-10-CM | POA: Diagnosis not present

## 2017-08-05 DIAGNOSIS — Y9389 Activity, other specified: Secondary | ICD-10-CM | POA: Diagnosis not present

## 2017-08-05 DIAGNOSIS — X58XXXA Exposure to other specified factors, initial encounter: Secondary | ICD-10-CM | POA: Diagnosis not present

## 2017-08-05 DIAGNOSIS — S83421A Sprain of lateral collateral ligament of right knee, initial encounter: Secondary | ICD-10-CM | POA: Diagnosis not present

## 2017-08-05 DIAGNOSIS — I10 Essential (primary) hypertension: Secondary | ICD-10-CM | POA: Diagnosis not present

## 2017-08-05 DIAGNOSIS — S838X1A Sprain of other specified parts of right knee, initial encounter: Secondary | ICD-10-CM | POA: Diagnosis not present

## 2017-08-09 DIAGNOSIS — M545 Low back pain: Secondary | ICD-10-CM | POA: Diagnosis not present

## 2017-08-09 DIAGNOSIS — M5416 Radiculopathy, lumbar region: Secondary | ICD-10-CM | POA: Diagnosis not present

## 2017-08-09 DIAGNOSIS — M5136 Other intervertebral disc degeneration, lumbar region: Secondary | ICD-10-CM | POA: Diagnosis not present

## 2017-08-11 DIAGNOSIS — M545 Low back pain: Secondary | ICD-10-CM | POA: Diagnosis not present

## 2017-08-16 DIAGNOSIS — M545 Low back pain: Secondary | ICD-10-CM | POA: Diagnosis not present

## 2017-08-16 DIAGNOSIS — M5416 Radiculopathy, lumbar region: Secondary | ICD-10-CM | POA: Diagnosis not present

## 2017-08-16 DIAGNOSIS — M5136 Other intervertebral disc degeneration, lumbar region: Secondary | ICD-10-CM | POA: Diagnosis not present

## 2017-08-17 ENCOUNTER — Ambulatory Visit (INDEPENDENT_AMBULATORY_CARE_PROVIDER_SITE_OTHER): Payer: Medicare Other | Admitting: Psychology

## 2017-08-17 DIAGNOSIS — F4322 Adjustment disorder with anxiety: Secondary | ICD-10-CM

## 2017-08-18 DIAGNOSIS — M5416 Radiculopathy, lumbar region: Secondary | ICD-10-CM | POA: Diagnosis not present

## 2017-08-18 DIAGNOSIS — M5136 Other intervertebral disc degeneration, lumbar region: Secondary | ICD-10-CM | POA: Diagnosis not present

## 2017-08-18 DIAGNOSIS — M545 Low back pain: Secondary | ICD-10-CM | POA: Diagnosis not present

## 2017-08-24 DIAGNOSIS — M25561 Pain in right knee: Secondary | ICD-10-CM | POA: Diagnosis not present

## 2017-08-24 DIAGNOSIS — M2391 Unspecified internal derangement of right knee: Secondary | ICD-10-CM | POA: Diagnosis not present

## 2017-08-24 DIAGNOSIS — M25551 Pain in right hip: Secondary | ICD-10-CM | POA: Diagnosis not present

## 2017-08-24 DIAGNOSIS — G5762 Lesion of plantar nerve, left lower limb: Secondary | ICD-10-CM | POA: Diagnosis not present

## 2017-08-25 DIAGNOSIS — M5136 Other intervertebral disc degeneration, lumbar region: Secondary | ICD-10-CM | POA: Diagnosis not present

## 2017-08-25 DIAGNOSIS — M545 Low back pain: Secondary | ICD-10-CM | POA: Diagnosis not present

## 2017-08-25 DIAGNOSIS — M5416 Radiculopathy, lumbar region: Secondary | ICD-10-CM | POA: Diagnosis not present

## 2017-08-29 DIAGNOSIS — H02831 Dermatochalasis of right upper eyelid: Secondary | ICD-10-CM | POA: Diagnosis not present

## 2017-08-29 DIAGNOSIS — M2391 Unspecified internal derangement of right knee: Secondary | ICD-10-CM | POA: Diagnosis not present

## 2017-08-29 DIAGNOSIS — H04123 Dry eye syndrome of bilateral lacrimal glands: Secondary | ICD-10-CM | POA: Diagnosis not present

## 2017-08-29 DIAGNOSIS — H2513 Age-related nuclear cataract, bilateral: Secondary | ICD-10-CM | POA: Diagnosis not present

## 2017-08-29 DIAGNOSIS — H02834 Dermatochalasis of left upper eyelid: Secondary | ICD-10-CM | POA: Diagnosis not present

## 2017-08-30 ENCOUNTER — Ambulatory Visit (INDEPENDENT_AMBULATORY_CARE_PROVIDER_SITE_OTHER): Payer: Medicare Other | Admitting: Psychology

## 2017-08-30 DIAGNOSIS — F4322 Adjustment disorder with anxiety: Secondary | ICD-10-CM

## 2017-08-31 DIAGNOSIS — R351 Nocturia: Secondary | ICD-10-CM | POA: Diagnosis not present

## 2017-08-31 DIAGNOSIS — N401 Enlarged prostate with lower urinary tract symptoms: Secondary | ICD-10-CM | POA: Diagnosis not present

## 2017-09-01 DIAGNOSIS — M545 Low back pain: Secondary | ICD-10-CM | POA: Diagnosis not present

## 2017-09-05 DIAGNOSIS — M25561 Pain in right knee: Secondary | ICD-10-CM | POA: Diagnosis not present

## 2017-09-05 DIAGNOSIS — S83241D Other tear of medial meniscus, current injury, right knee, subsequent encounter: Secondary | ICD-10-CM | POA: Diagnosis not present

## 2017-09-05 DIAGNOSIS — S83281A Other tear of lateral meniscus, current injury, right knee, initial encounter: Secondary | ICD-10-CM | POA: Diagnosis not present

## 2017-09-08 DIAGNOSIS — N401 Enlarged prostate with lower urinary tract symptoms: Secondary | ICD-10-CM | POA: Diagnosis not present

## 2017-09-08 DIAGNOSIS — N486 Induration penis plastica: Secondary | ICD-10-CM | POA: Diagnosis not present

## 2017-09-13 ENCOUNTER — Ambulatory Visit (INDEPENDENT_AMBULATORY_CARE_PROVIDER_SITE_OTHER): Payer: Medicare Other | Admitting: Psychology

## 2017-09-13 DIAGNOSIS — E785 Hyperlipidemia, unspecified: Secondary | ICD-10-CM | POA: Diagnosis not present

## 2017-09-13 DIAGNOSIS — F4322 Adjustment disorder with anxiety: Secondary | ICD-10-CM

## 2017-09-13 DIAGNOSIS — I10 Essential (primary) hypertension: Secondary | ICD-10-CM | POA: Diagnosis not present

## 2017-09-13 DIAGNOSIS — R0602 Shortness of breath: Secondary | ICD-10-CM | POA: Diagnosis not present

## 2017-09-13 DIAGNOSIS — R42 Dizziness and giddiness: Secondary | ICD-10-CM | POA: Diagnosis not present

## 2017-09-13 DIAGNOSIS — R001 Bradycardia, unspecified: Secondary | ICD-10-CM | POA: Diagnosis not present

## 2017-09-13 DIAGNOSIS — R55 Syncope and collapse: Secondary | ICD-10-CM | POA: Diagnosis not present

## 2017-09-28 DIAGNOSIS — S83241A Other tear of medial meniscus, current injury, right knee, initial encounter: Secondary | ICD-10-CM | POA: Diagnosis not present

## 2017-09-28 DIAGNOSIS — M23361 Other meniscus derangements, other lateral meniscus, right knee: Secondary | ICD-10-CM | POA: Diagnosis not present

## 2017-09-28 DIAGNOSIS — S83281A Other tear of lateral meniscus, current injury, right knee, initial encounter: Secondary | ICD-10-CM | POA: Diagnosis not present

## 2017-09-28 DIAGNOSIS — S83281D Other tear of lateral meniscus, current injury, right knee, subsequent encounter: Secondary | ICD-10-CM | POA: Diagnosis not present

## 2017-09-28 DIAGNOSIS — M109 Gout, unspecified: Secondary | ICD-10-CM | POA: Diagnosis not present

## 2017-09-28 DIAGNOSIS — F419 Anxiety disorder, unspecified: Secondary | ICD-10-CM | POA: Diagnosis not present

## 2017-09-28 DIAGNOSIS — S83241D Other tear of medial meniscus, current injury, right knee, subsequent encounter: Secondary | ICD-10-CM | POA: Diagnosis not present

## 2017-09-28 DIAGNOSIS — I10 Essential (primary) hypertension: Secondary | ICD-10-CM | POA: Diagnosis not present

## 2017-09-28 DIAGNOSIS — M948X6 Other specified disorders of cartilage, lower leg: Secondary | ICD-10-CM | POA: Diagnosis not present

## 2017-09-28 DIAGNOSIS — E7800 Pure hypercholesterolemia, unspecified: Secondary | ICD-10-CM | POA: Diagnosis not present

## 2017-09-28 DIAGNOSIS — M23321 Other meniscus derangements, posterior horn of medial meniscus, right knee: Secondary | ICD-10-CM | POA: Diagnosis not present

## 2017-09-28 DIAGNOSIS — E785 Hyperlipidemia, unspecified: Secondary | ICD-10-CM | POA: Diagnosis not present

## 2017-09-28 DIAGNOSIS — F329 Major depressive disorder, single episode, unspecified: Secondary | ICD-10-CM | POA: Diagnosis not present

## 2017-09-28 DIAGNOSIS — K219 Gastro-esophageal reflux disease without esophagitis: Secondary | ICD-10-CM | POA: Diagnosis not present

## 2017-09-28 DIAGNOSIS — Z881 Allergy status to other antibiotic agents status: Secondary | ICD-10-CM | POA: Diagnosis not present

## 2017-10-05 DIAGNOSIS — M109 Gout, unspecified: Secondary | ICD-10-CM | POA: Diagnosis not present

## 2017-10-05 DIAGNOSIS — N200 Calculus of kidney: Secondary | ICD-10-CM | POA: Diagnosis not present

## 2017-10-05 DIAGNOSIS — R05 Cough: Secondary | ICD-10-CM | POA: Diagnosis not present

## 2017-10-05 DIAGNOSIS — Z85820 Personal history of malignant melanoma of skin: Secondary | ICD-10-CM | POA: Diagnosis not present

## 2017-10-05 DIAGNOSIS — I82441 Acute embolism and thrombosis of right tibial vein: Secondary | ICD-10-CM | POA: Diagnosis not present

## 2017-10-05 DIAGNOSIS — J301 Allergic rhinitis due to pollen: Secondary | ICD-10-CM | POA: Diagnosis not present

## 2017-10-05 DIAGNOSIS — E785 Hyperlipidemia, unspecified: Secondary | ICD-10-CM | POA: Diagnosis not present

## 2017-10-05 DIAGNOSIS — I80201 Phlebitis and thrombophlebitis of unspecified deep vessels of right lower extremity: Secondary | ICD-10-CM | POA: Diagnosis not present

## 2017-10-05 DIAGNOSIS — J449 Chronic obstructive pulmonary disease, unspecified: Secondary | ICD-10-CM | POA: Diagnosis not present

## 2017-10-05 DIAGNOSIS — F419 Anxiety disorder, unspecified: Secondary | ICD-10-CM | POA: Diagnosis not present

## 2017-10-08 ENCOUNTER — Other Ambulatory Visit (HOSPITAL_COMMUNITY): Payer: Self-pay | Admitting: Psychiatry

## 2017-10-08 DIAGNOSIS — F331 Major depressive disorder, recurrent, moderate: Secondary | ICD-10-CM

## 2017-10-12 ENCOUNTER — Other Ambulatory Visit (HOSPITAL_COMMUNITY): Payer: Self-pay

## 2017-10-12 DIAGNOSIS — F331 Major depressive disorder, recurrent, moderate: Secondary | ICD-10-CM

## 2017-10-12 MED ORDER — TRAZODONE HCL 100 MG PO TABS: 200.0000 mg | ORAL_TABLET | Freq: Every day | ORAL | 0 refills | Status: DC

## 2017-10-13 DIAGNOSIS — Z4789 Encounter for other orthopedic aftercare: Secondary | ICD-10-CM | POA: Diagnosis not present

## 2017-10-13 DIAGNOSIS — M25561 Pain in right knee: Secondary | ICD-10-CM | POA: Diagnosis not present

## 2017-10-13 DIAGNOSIS — M23331 Other meniscus derangements, other medial meniscus, right knee: Secondary | ICD-10-CM | POA: Diagnosis not present

## 2017-10-17 ENCOUNTER — Other Ambulatory Visit (HOSPITAL_COMMUNITY): Payer: Self-pay

## 2017-10-17 DIAGNOSIS — M25561 Pain in right knee: Secondary | ICD-10-CM | POA: Diagnosis not present

## 2017-10-17 DIAGNOSIS — F331 Major depressive disorder, recurrent, moderate: Secondary | ICD-10-CM

## 2017-10-17 DIAGNOSIS — M23331 Other meniscus derangements, other medial meniscus, right knee: Secondary | ICD-10-CM | POA: Diagnosis not present

## 2017-10-17 DIAGNOSIS — Z4789 Encounter for other orthopedic aftercare: Secondary | ICD-10-CM | POA: Diagnosis not present

## 2017-10-17 MED ORDER — LAMOTRIGINE 150 MG PO TABS: 150.0000 mg | ORAL_TABLET | Freq: Every day | ORAL | 0 refills | Status: DC

## 2017-10-17 MED ORDER — TRAZODONE HCL 100 MG PO TABS: 200.0000 mg | ORAL_TABLET | Freq: Every day | ORAL | 0 refills | Status: DC

## 2017-10-17 MED ORDER — BUPROPION HCL ER (XL) 150 MG PO TB24: ORAL_TABLET | ORAL | 0 refills | Status: DC

## 2017-10-19 ENCOUNTER — Ambulatory Visit (HOSPITAL_COMMUNITY): Payer: Medicare Other | Admitting: Psychiatry

## 2017-10-24 DIAGNOSIS — M23331 Other meniscus derangements, other medial meniscus, right knee: Secondary | ICD-10-CM | POA: Diagnosis not present

## 2017-10-24 DIAGNOSIS — M25561 Pain in right knee: Secondary | ICD-10-CM | POA: Diagnosis not present

## 2017-10-24 DIAGNOSIS — Z4789 Encounter for other orthopedic aftercare: Secondary | ICD-10-CM | POA: Diagnosis not present

## 2017-10-26 DIAGNOSIS — I10 Essential (primary) hypertension: Secondary | ICD-10-CM | POA: Diagnosis not present

## 2017-10-26 DIAGNOSIS — M109 Gout, unspecified: Secondary | ICD-10-CM | POA: Diagnosis not present

## 2017-10-26 DIAGNOSIS — Z85820 Personal history of malignant melanoma of skin: Secondary | ICD-10-CM | POA: Diagnosis not present

## 2017-10-26 DIAGNOSIS — I80201 Phlebitis and thrombophlebitis of unspecified deep vessels of right lower extremity: Secondary | ICD-10-CM | POA: Diagnosis not present

## 2017-10-26 DIAGNOSIS — J301 Allergic rhinitis due to pollen: Secondary | ICD-10-CM | POA: Diagnosis not present

## 2017-10-26 DIAGNOSIS — R5383 Other fatigue: Secondary | ICD-10-CM | POA: Diagnosis not present

## 2017-10-26 DIAGNOSIS — R202 Paresthesia of skin: Secondary | ICD-10-CM | POA: Diagnosis not present

## 2017-10-26 DIAGNOSIS — N200 Calculus of kidney: Secondary | ICD-10-CM | POA: Diagnosis not present

## 2017-10-26 DIAGNOSIS — J449 Chronic obstructive pulmonary disease, unspecified: Secondary | ICD-10-CM | POA: Diagnosis not present

## 2017-10-26 DIAGNOSIS — C434 Malignant melanoma of scalp and neck: Secondary | ICD-10-CM | POA: Diagnosis not present

## 2017-10-26 DIAGNOSIS — E785 Hyperlipidemia, unspecified: Secondary | ICD-10-CM | POA: Diagnosis not present

## 2017-10-26 DIAGNOSIS — E782 Mixed hyperlipidemia: Secondary | ICD-10-CM | POA: Diagnosis not present

## 2017-10-27 DIAGNOSIS — M23331 Other meniscus derangements, other medial meniscus, right knee: Secondary | ICD-10-CM | POA: Diagnosis not present

## 2017-10-27 DIAGNOSIS — M25561 Pain in right knee: Secondary | ICD-10-CM | POA: Diagnosis not present

## 2017-10-27 DIAGNOSIS — Z4789 Encounter for other orthopedic aftercare: Secondary | ICD-10-CM | POA: Diagnosis not present

## 2017-10-31 ENCOUNTER — Other Ambulatory Visit (HOSPITAL_COMMUNITY): Payer: Self-pay

## 2017-10-31 ENCOUNTER — Other Ambulatory Visit (HOSPITAL_COMMUNITY): Payer: Self-pay | Admitting: Psychiatry

## 2017-10-31 DIAGNOSIS — M25561 Pain in right knee: Secondary | ICD-10-CM | POA: Diagnosis not present

## 2017-10-31 DIAGNOSIS — Z4789 Encounter for other orthopedic aftercare: Secondary | ICD-10-CM | POA: Diagnosis not present

## 2017-10-31 DIAGNOSIS — F331 Major depressive disorder, recurrent, moderate: Secondary | ICD-10-CM

## 2017-10-31 DIAGNOSIS — M23331 Other meniscus derangements, other medial meniscus, right knee: Secondary | ICD-10-CM | POA: Diagnosis not present

## 2017-10-31 MED ORDER — TRAZODONE HCL 100 MG PO TABS: 200.0000 mg | ORAL_TABLET | Freq: Every day | ORAL | 0 refills | Status: DC

## 2017-11-02 ENCOUNTER — Other Ambulatory Visit (HOSPITAL_COMMUNITY): Payer: Self-pay

## 2017-11-02 DIAGNOSIS — F331 Major depressive disorder, recurrent, moderate: Secondary | ICD-10-CM

## 2017-11-02 MED ORDER — TRAZODONE HCL 100 MG PO TABS: 200.0000 mg | ORAL_TABLET | Freq: Every day | ORAL | 0 refills | Status: DC

## 2017-11-03 DIAGNOSIS — M23331 Other meniscus derangements, other medial meniscus, right knee: Secondary | ICD-10-CM | POA: Diagnosis not present

## 2017-11-03 DIAGNOSIS — Z4789 Encounter for other orthopedic aftercare: Secondary | ICD-10-CM | POA: Diagnosis not present

## 2017-11-03 DIAGNOSIS — M25561 Pain in right knee: Secondary | ICD-10-CM | POA: Diagnosis not present

## 2017-11-08 DIAGNOSIS — Z4789 Encounter for other orthopedic aftercare: Secondary | ICD-10-CM | POA: Diagnosis not present

## 2017-11-08 DIAGNOSIS — M23331 Other meniscus derangements, other medial meniscus, right knee: Secondary | ICD-10-CM | POA: Diagnosis not present

## 2017-11-08 DIAGNOSIS — M25561 Pain in right knee: Secondary | ICD-10-CM | POA: Diagnosis not present

## 2017-11-10 DIAGNOSIS — Z4789 Encounter for other orthopedic aftercare: Secondary | ICD-10-CM | POA: Diagnosis not present

## 2017-11-10 DIAGNOSIS — M23331 Other meniscus derangements, other medial meniscus, right knee: Secondary | ICD-10-CM | POA: Diagnosis not present

## 2017-11-10 DIAGNOSIS — M25561 Pain in right knee: Secondary | ICD-10-CM | POA: Diagnosis not present

## 2017-11-22 DIAGNOSIS — M5136 Other intervertebral disc degeneration, lumbar region: Secondary | ICD-10-CM | POA: Diagnosis not present

## 2017-11-30 ENCOUNTER — Ambulatory Visit (INDEPENDENT_AMBULATORY_CARE_PROVIDER_SITE_OTHER): Payer: Medicare Other | Admitting: Psychiatry

## 2017-11-30 ENCOUNTER — Encounter (HOSPITAL_COMMUNITY): Payer: Self-pay | Admitting: Psychiatry

## 2017-11-30 VITALS — BP 126/70 | Ht 69.0 in | Wt 150.0 lb

## 2017-11-30 DIAGNOSIS — Z87891 Personal history of nicotine dependence: Secondary | ICD-10-CM

## 2017-11-30 DIAGNOSIS — F5104 Psychophysiologic insomnia: Secondary | ICD-10-CM

## 2017-11-30 DIAGNOSIS — R4586 Emotional lability: Secondary | ICD-10-CM | POA: Diagnosis not present

## 2017-11-30 DIAGNOSIS — Z79899 Other long term (current) drug therapy: Secondary | ICD-10-CM

## 2017-11-30 DIAGNOSIS — F331 Major depressive disorder, recurrent, moderate: Secondary | ICD-10-CM | POA: Diagnosis not present

## 2017-11-30 DIAGNOSIS — Z818 Family history of other mental and behavioral disorders: Secondary | ICD-10-CM | POA: Diagnosis not present

## 2017-11-30 MED ORDER — BUPROPION HCL ER (XL) 150 MG PO TB24: ORAL_TABLET | ORAL | 0 refills | Status: DC

## 2017-11-30 MED ORDER — TRAZODONE HCL 100 MG PO TABS: 200.0000 mg | ORAL_TABLET | Freq: Every day | ORAL | 0 refills | Status: DC

## 2017-11-30 MED ORDER — LAMOTRIGINE 150 MG PO TABS: 150.0000 mg | ORAL_TABLET | Freq: Every day | ORAL | 0 refills | Status: DC

## 2017-11-30 NOTE — Progress Notes (Signed)
[facility] MD/PA/NP OP Progress Note  11/30/2017 10:07 AM Colin Galvan.  MRN:  [id number]  Chief Complaint: "life is good, my wife and I are in therapy and it is really helping"  HPI: Patient came for her follow-up appointment.  He endorsed that he and his wife of 21 years have started therapy with Trey Paula, and things in his marriage are much better.  He states they have tried therapy in the past which was not effective.  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  He reports good health, however has had som problems with his back, so is unable to play tennis and be as active as he has in the past. Patient does not want to change his medication.  He is sleeping well with sleep aid and appetite is good.  He denies any mania, psychosis, hallucination or any paranoia.  He denies any suicidal or homicidal thoughts or any feeling of hopelessness or worthlessness.  His vital signs are stable.  He has no tremors, rash, itching.  He denies illicit substance use.  He has questions regarding Xarelto, which was started after knee problem.  He will be on it for another month and believes it has caused him to be fatigued. Information provided. He is also on steroids for his back.  He denies hypomania, mania or irritable/anger symptoms.   Visit Diagnosis:  No diagnosis found.  Past Psychiatric History: Reviewed with Patient has history of depression for almost 20 years. In the past he has tried Cymbalta, Klonopin and Prestiq. Patient denies any history of mania or psychosis. He denies any history of psychiatric inpatient treatment or any suicidal attempt.We tried increasing Lamictal but he developed itching and we reduced the dose.  Past Medical History:  Past Medical History:  Diagnosis Date  . Allergic rhinitis   . Depression   . Family hx of colon cancer   . Family hx of melanoma   . Fatigue   . Gout   . Hx of colonic polyps   .  Hyperlipidemia   . Hypertension   . Nephrolithiasis   . Other diseases of lung, not elsewhere classified   . Vitamin D deficiency     Past Surgical History:  Procedure Laterality Date  . KNEE ARTHROSCOPY Right   . MELANOMA EXCISION N/A 07/2016  . ROTATOR CUFF REPAIR Right 02/2016  . SKIN CANCER EXCISION      Family Psychiatric History: Reviewed  Family History:  Family History  Problem Relation Age of Onset  . Colon cancer Father        Deceased, 80  . Stroke Father   . Melanoma Mother        Deceased, 43  . Hypertension Mother   . Diabetes Mellitus II Brother   . Stroke Brother        Deceased, 72  . Depression Son     Social History:  Social History   Socioeconomic History  . Marital status: Married    Spouse name: Not on file  . Number of children: Not on file  . Years of education: Not on file  . Highest education level: Not on file  Occupational History  . Occupation: RETIRED  Social Needs  . Financial resource strain: Not on file  . Food insecurity:    Worry: Not on file    Inability: Not on file  . Transportation needs:    Medical: Not on file  Non-medical: Not on file  Tobacco Use  . Smoking status: Former Smoker    Last attempt to quit: 03/22/1980    Years since quitting: 37.7  . Smokeless tobacco: Never Used  Substance and Sexual Activity  . Alcohol use: Yes    Alcohol/week: 9.0 standard drinks    Types: 3 Glasses of wine, 6 Cans of beer per week    Comment: 1 beer/mixed drink daily  . Drug use: No  . Sexual activity: Yes    Partners: Female    Birth control/protection: None  Lifestyle  . Physical activity:    Days per week: Not on file    Minutes per session: Not on file  . Stress: Not on file  Relationships  . Social connections:    Talks on phone: Not on file    Gets together: Not on file    Attends religious service: Not on file    Active member of club or organization: Not on file    Attends meetings of clubs or organizations:  Not on file    Relationship status: Not on file  Other Topics Concern  . Not on file  Social History Narrative   Lives with wife.    Retired Forensic psychologist, Chief Strategy Officer.   Highest level of education:  JD    Allergies:  Allergies  Allergen Reactions  . Tetracycline Hcl     Metabolic Disorder Labs: No results found for: HGBA1C, MPG No results found for: PROLACTIN No results found for: CHOL, TRIG, HDL, CHOLHDL, VLDL, LDLCALC Lab Results  Component Value Date   TSH 1.728 06/25/2014    Therapeutic Level Labs: No results found for: LITHIUM No results found for: VALPROATE No components found for:  CBMZ  Current Medications: Current Outpatient Medications  Medication Sig Dispense Refill  . allopurinol (ZYLOPRIM) 300 MG tablet Take 300 mg by mouth daily.      Marland Kitchen amLODipine (NORVASC) 5 MG tablet     . aspirin 81 MG tablet Take 81 mg by mouth daily.      Marland Kitchen buPROPion (WELLBUTRIN XL) 150 MG 24 hr tablet TAKE 1 TABLET BY MOUTH ONCE DAILY IN THE MORNING 90 tablet 0  . carvedilol (COREG) 6.25 MG tablet     . Cetirizine HCl 10 MG CAPS Take by mouth.    . lamoTRIgine (LAMICTAL) 150 MG tablet Take 1 tablet (150 mg total) by mouth daily. 90 tablet 0  . omeprazole (PRILOSEC) 20 MG capsule     . predniSONE (DELTASONE) 5 MG tablet Take 5 mg by mouth daily with breakfast.    . simvastatin (ZOCOR) 40 MG tablet Take 40 mg by mouth daily.    . traZODone (DESYREL) 100 MG tablet Take 2 tablets (200 mg total) by mouth at bedtime. 60 tablet 0  . triamterene-hydrochlorothiazide (MAXZIDE-25) 37.5-25 MG tablet     . Vitamin D, Ergocalciferol, (DRISDOL) 50000 UNITS CAPS capsule Take by mouth.     No current facility-administered medications for this visit.      Musculoskeletal: Strength & Muscle Tone: within normal limits Gait & Station: normal Patient leans: N/A  Psychiatric Specialty Exam: Review of Systems  Constitutional: Negative.   Respiratory: Negative.   Cardiovascular: Negative.    Gastrointestinal: Negative.   Musculoskeletal: Positive for back pain (On prednisone since 9/4 for one month). Negative for joint pain, myalgias and neck pain.  Psychiatric/Behavioral: Positive for depression (0/10). Negative for hallucinations, substance abuse (1 beer or mixed drink a day) and suicidal ideas. The patient is not  nervous/anxious and does not have insomnia.     Blood pressure 126/70, height 5\' 9"  (1.753 m), weight 150 lb (68 kg).Body mass index is 22.15 kg/m.  General Appearance: Casual  Eye Contact:  Good  Speech:  Clear and Coherent  Volume:  Normal  Mood:  Dysphoric  Affect:  Depressed  Thought Process:  Goal Directed  Orientation:  Full (Time, Place, and Person)  Thought Content: Rumination   Suicidal Thoughts:  No  Homicidal Thoughts:  No  Memory:  Immediate;   Good Recent;   Good Remote;   Good  Judgement:  Good  Insight:  Good  Psychomotor Activity:  Normal  Concentration:  Concentration: Fair and Attention Span: Fair  Recall:  Good  Fund of Knowledge: Good  Language: Good  Akathisia:  No  Handed:  Right  AIMS (if indicated): not done  Assets:  Communication Skills Desire for Improvement Housing Resilience  ADL's:  Intact  Cognition: WNL  Sleep:  Fair   Screenings:   Assessment and Plan: Major depressive disorder, recurrent.  Generalized anxiety disorder. Mood lability. insomnia  Things have improved in marriage, which has improved patient's overall mood.  Encouraged ongoing therapy with Trey Paula, now every 2 months.  He has some recent stressors related to back pain and decreased activity. Recommended water exercises. Continue Wellbutrin XL 150 mg daily, Lamictal 150 mg daily and trazodone 200 mg at bedtime.  Recommended to call us back if he has any question or any concerns.   Time spent 25 minutes.  More than 50% of the time spent in medication education, psychoeducation, counseling and coordination of care.  Discuss safety plan that anytime  having active suicidal thoughts or homicidal thoughts then patient need to call 911 or go to the local emergency room.  Follow-up in 3 months.   Lavella Hammock, MD 11/30/2017, 10:07 AM

## 2017-11-30 NOTE — Patient Instructions (Signed)
Rivaroxaban oral tablets °What is this medicine? °RIVAROXABAN (ri va ROX a ban) is an anticoagulant (blood thinner). It is used to treat blood clots in the lungs or in the veins. It is also used after knee or hip surgeries to prevent blood clots. It is also used to lower the chance of stroke in people with a medical condition called atrial fibrillation. °This medicine may be used for other purposes; ask your health care provider or pharmacist if you have questions. °COMMON BRAND NAME(S): Xarelto, Xarelto Starter Pack °What should I tell my health care provider before I take this medicine? °They need to know if you have any of these conditions: °-bleeding disorders °-bleeding in the brain °-blood in your stools (black or tarry stools) or if you have blood in your vomit °-history of stomach bleeding °-kidney disease °-liver disease °-low blood counts, like low white cell, platelet, or red cell counts °-recent or planned spinal or epidural procedure °-take medicines that treat or prevent blood clots °-an unusual or allergic reaction to rivaroxaban, other medicines, foods, dyes, or preservatives °-pregnant or trying to get pregnant °-breast-feeding °How should I use this medicine? °Take this medicine by mouth with a glass of water. Follow the directions on the prescription label. Take your medicine at regular intervals. Do not take it more often than directed. Do not stop taking except on your doctor's advice. Stopping this medicine may increase your risk of a blood clot. Be sure to refill your prescription before you run out of medicine. °If you are taking this medicine after hip or knee replacement surgery, take it with or without food. If you are taking this medicine for atrial fibrillation, take it with your evening meal. If you are taking this medicine to treat blood clots, take it with food at the same time each day. If you are unable to swallow your tablet, you may crush the tablet and mix it in applesauce. Then,  immediately eat the applesauce. You should eat more food right after you eat the applesauce containing the crushed tablet. °Talk to your pediatrician regarding the use of this medicine in children. Special care may be needed. °Overdosage: If you think you have taken too much of this medicine contact a poison control center or emergency room at once. °NOTE: This medicine is only for you. Do not share this medicine with others. °What if I miss a dose? °If you take your medicine once a day and miss a dose, take the missed dose as soon as you remember. If you take your medicine twice a day and miss a dose, take the missed dose immediately. In this instance, 2 tablets may be taken at the same time. The next day you should take 1 tablet twice a day as directed. °What may interact with this medicine? °Do not take this medicine with any of the following medications: °-defibrotide °This medicine may also interact with the following medications: °-aspirin and aspirin-like medicines °-certain antibiotics like erythromycin, azithromycin, and clarithromycin °-certain medicines for fungal infections like ketoconazole and itraconazole °-certain medicines for irregular heart beat like amiodarone, quinidine, dronedarone °-certain medicines for seizures like carbamazepine, phenytoin °-certain medicines that treat or prevent blood clots like warfarin, enoxaparin, and dalteparin °-conivaptan °-diltiazem °-felodipine °-indinavir °-lopinavir; ritonavir °-NSAIDS, medicines for pain and inflammation, like ibuprofen or naproxen °-ranolazine °-rifampin °-ritonavir °-SNRIs, medicines for depression, like desvenlafaxine, duloxetine, levomilnacipran, venlafaxine °-SSRIs, medicines for depression, like citalopram, escitalopram, fluoxetine, fluvoxamine, paroxetine, sertraline °-St. John's wort °-verapamil °This list may not describe all   possible interactions. Give your health care provider a list of all the medicines, herbs, non-prescription  drugs, or dietary supplements you use. Also tell them if you smoke, drink alcohol, or use illegal drugs. Some items may interact with your medicine. °What should I watch for while using this medicine? °Visit your doctor or health care professional for regular checks on your progress. °Notify your doctor or health care professional and seek emergency treatment if you develop breathing problems; changes in vision; chest pain; severe, sudden headache; pain, swelling, warmth in the leg; trouble speaking; sudden numbness or weakness of the face, arm or leg. These can be signs that your condition has gotten worse. °If you are going to have surgery or other procedure, tell your doctor that you are taking this medicine. °What side effects may I notice from receiving this medicine? °Side effects that you should report to your doctor or health care professional as soon as possible: °-allergic reactions like skin rash, itching or hives, swelling of the face, lips, or tongue °-back pain °-redness, blistering, peeling or loosening of the skin, including inside the mouth °-signs and symptoms of bleeding such as bloody or black, tarry stools; red or dark-brown urine; spitting up blood or brown material that looks like coffee grounds; red spots on the skin; unusual bruising or bleeding from the eye, gums, or nose °Side effects that usually do not require medical attention (report to your doctor or health care professional if they continue or are bothersome): °-dizziness °-muscle pain °This list may not describe all possible side effects. Call your doctor for medical advice about side effects. You may report side effects to FDA at 1-800-FDA-1088. °Where should I keep my medicine? °Keep out of the reach of children. °Store at room temperature between 15 and 30 degrees C (59 and 86 degrees F). Throw away any unused medicine after the expiration date. °NOTE: This sheet is a summary. It may not cover all possible information. If you  have questions about this medicine, talk to your doctor, pharmacist, or health care provider. °© 2018 [employer] (2015-11-26 16:29:33) ° °

## 2017-12-19 ENCOUNTER — Ambulatory Visit: Payer: Medicare Other | Admitting: Psychology

## 2017-12-22 DIAGNOSIS — G5762 Lesion of plantar nerve, left lower limb: Secondary | ICD-10-CM | POA: Diagnosis not present

## 2017-12-29 DIAGNOSIS — G629 Polyneuropathy, unspecified: Secondary | ICD-10-CM | POA: Diagnosis not present

## 2017-12-29 DIAGNOSIS — R202 Paresthesia of skin: Secondary | ICD-10-CM | POA: Diagnosis not present

## 2017-12-29 DIAGNOSIS — I63132 Cerebral infarction due to embolism of left carotid artery: Secondary | ICD-10-CM | POA: Diagnosis not present

## 2017-12-29 DIAGNOSIS — M79661 Pain in right lower leg: Secondary | ICD-10-CM | POA: Diagnosis not present

## 2017-12-29 DIAGNOSIS — Z79899 Other long term (current) drug therapy: Secondary | ICD-10-CM | POA: Diagnosis not present

## 2017-12-29 DIAGNOSIS — D137 Benign neoplasm of endocrine pancreas: Secondary | ICD-10-CM | POA: Diagnosis not present

## 2017-12-29 DIAGNOSIS — E0844 Diabetes mellitus due to underlying condition with diabetic amyotrophy: Secondary | ICD-10-CM | POA: Diagnosis not present

## 2017-12-29 DIAGNOSIS — M79662 Pain in left lower leg: Secondary | ICD-10-CM | POA: Diagnosis not present

## 2018-01-12 DIAGNOSIS — Z85820 Personal history of malignant melanoma of skin: Secondary | ICD-10-CM | POA: Diagnosis not present

## 2018-01-12 DIAGNOSIS — L570 Actinic keratosis: Secondary | ICD-10-CM | POA: Diagnosis not present

## 2018-01-12 DIAGNOSIS — D235 Other benign neoplasm of skin of trunk: Secondary | ICD-10-CM | POA: Diagnosis not present

## 2018-01-12 DIAGNOSIS — Z85828 Personal history of other malignant neoplasm of skin: Secondary | ICD-10-CM | POA: Diagnosis not present

## 2018-01-16 DIAGNOSIS — Z23 Encounter for immunization: Secondary | ICD-10-CM | POA: Diagnosis not present

## 2018-01-19 ENCOUNTER — Other Ambulatory Visit (HOSPITAL_COMMUNITY): Payer: Self-pay

## 2018-01-19 DIAGNOSIS — F331 Major depressive disorder, recurrent, moderate: Secondary | ICD-10-CM

## 2018-01-19 MED ORDER — TRAZODONE HCL 100 MG PO TABS: 200.0000 mg | ORAL_TABLET | Freq: Every day | ORAL | 1 refills | Status: DC

## 2018-02-01 DIAGNOSIS — K645 Perianal venous thrombosis: Secondary | ICD-10-CM | POA: Diagnosis not present

## 2018-02-03 DIAGNOSIS — M19041 Primary osteoarthritis, right hand: Secondary | ICD-10-CM | POA: Diagnosis not present

## 2018-02-13 DIAGNOSIS — M109 Gout, unspecified: Secondary | ICD-10-CM | POA: Diagnosis not present

## 2018-02-13 DIAGNOSIS — N200 Calculus of kidney: Secondary | ICD-10-CM | POA: Diagnosis not present

## 2018-02-13 DIAGNOSIS — E785 Hyperlipidemia, unspecified: Secondary | ICD-10-CM | POA: Diagnosis not present

## 2018-02-13 DIAGNOSIS — J301 Allergic rhinitis due to pollen: Secondary | ICD-10-CM | POA: Diagnosis not present

## 2018-02-21 DIAGNOSIS — R351 Nocturia: Secondary | ICD-10-CM | POA: Diagnosis not present

## 2018-02-21 DIAGNOSIS — N401 Enlarged prostate with lower urinary tract symptoms: Secondary | ICD-10-CM | POA: Diagnosis not present

## 2018-02-21 DIAGNOSIS — E785 Hyperlipidemia, unspecified: Secondary | ICD-10-CM | POA: Diagnosis not present

## 2018-02-24 ENCOUNTER — Ambulatory Visit: Payer: Medicare Other | Admitting: Psychology

## 2018-03-01 ENCOUNTER — Encounter (HOSPITAL_COMMUNITY): Payer: Self-pay | Admitting: Psychiatry

## 2018-03-01 ENCOUNTER — Ambulatory Visit (INDEPENDENT_AMBULATORY_CARE_PROVIDER_SITE_OTHER): Payer: Medicare Other | Admitting: Psychiatry

## 2018-03-01 VITALS — BP 122/70 | Ht 69.0 in | Wt 151.0 lb

## 2018-03-01 DIAGNOSIS — F331 Major depressive disorder, recurrent, moderate: Secondary | ICD-10-CM | POA: Diagnosis not present

## 2018-03-01 DIAGNOSIS — R4586 Emotional lability: Secondary | ICD-10-CM

## 2018-03-01 DIAGNOSIS — F411 Generalized anxiety disorder: Secondary | ICD-10-CM

## 2018-03-01 MED ORDER — LAMOTRIGINE 150 MG PO TABS: 150.0000 mg | ORAL_TABLET | Freq: Every day | ORAL | 0 refills | Status: DC

## 2018-03-01 MED ORDER — BUPROPION HCL ER (XL) 150 MG PO TB24: ORAL_TABLET | ORAL | 0 refills | Status: DC

## 2018-03-01 MED ORDER — TRAZODONE HCL 100 MG PO TABS: 200.0000 mg | ORAL_TABLET | Freq: Every day | ORAL | 1 refills | Status: DC

## 2018-03-01 NOTE — Progress Notes (Signed)
BH MD/PA/NP OP Progress Note  03/01/2018 10:32 AM Colin Galvan.  MRN:  [id number]  Chief Complaint: Continue to have issues with my wife. She left last Sunday after an argument.  HPI: Colin Galvan came for his follow-up appointment.  He was doing fine until recently his wife quit marriage counseling and last Sunday after an argument left the house and went to [place] to the kids.  Patient told both are seeing Trey Paula and things are going very well but she continued to have moments of irritability, agitation and frustration.  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  We have recommended to see his wife to see Dr. Wadie Lessen however patient feel that his wife did not connect with Dr. Wadie Lessen and decided not to continue.  He was pleased when they were in marriage counseling and they have appointment in in January but he is not sure if she will attend.  Patient is very frustrated with the living situation.  He is thinking to give ultimatum to his wife that she is not interested in therapy then he may have to divorce.  He does not want a divorce.  He admitted sleeping is okay because he is thinking too much about his marriage.  He denies any suicidal thoughts or homicidal thought.  Denies any paranoia, irritability or any anger.  He believes his wife suffers from borderline personality because most of the symptoms consistent with her behavior.  He is thinking to discuss with his wife getting DBT treatment.  His friend has recommended someone in [place] and is hoping that wife can schedule appointment.  Other than marital stress he continues to do volunteer work and feels everything is going okay.  He feels sad that he is not able to play tennis because of recent knee surgery but he involved himself very much and painting, reading, walking and doing volunteer work.  His energy level is okay.  His vital signs are stable.  Visit Diagnosis:    ICD-10-CM   1. GAD  (generalized anxiety disorder) F41.1 buPROPion (WELLBUTRIN XL) 150 MG 24 hr tablet  2. Moderate episode of recurrent major depressive disorder (HCC) F33.1 traZODone (DESYREL) 100 MG tablet    lamoTRIgine (LAMICTAL) 150 MG tablet    buPROPion (WELLBUTRIN XL) 150 MG 24 hr tablet  3. Labile mood R45.86 lamoTRIgine (LAMICTAL) 150 MG tablet    Past Psychiatric History: Reviewed. History of depression for almost 20 years. Tried Cymbalta, Klonopin and Prestiq. No history of mania, psychosis, psychiatric inpatient treatment or any suicidal attempt.We tried increasing Lamictal but he developed itching and we reduced the dose.  Past Medical History:  Past Medical History:  Diagnosis Date  . Allergic rhinitis   . Depression   . Family hx of colon cancer   . Family hx of melanoma   . Fatigue   . Gout   . Hx of colonic polyps   . Hyperlipidemia   . Hypertension   . Nephrolithiasis   . Other diseases of lung, not elsewhere classified   . Vitamin D deficiency     Past Surgical History:  Procedure Laterality Date  . KNEE ARTHROSCOPY Right   . MELANOMA EXCISION N/A 07/2016  . ROTATOR CUFF REPAIR Right 02/2016  . SKIN CANCER EXCISION      Family Psychiatric History: Reviewed.  Family History:  Family History  Problem Relation Age of Onset  . Colon cancer Father        Deceased,  29  . Stroke Father   . Melanoma Mother        Deceased, 22  . Hypertension Mother   . Diabetes Mellitus II Brother   . Stroke Brother        Deceased, 20  . Depression Son     Social History:  Social History   Socioeconomic History  . Marital status: Married    Spouse name: Not on file  . Number of children: Not on file  . Years of education: Not on file  . Highest education level: Not on file  Occupational History  . Occupation: RETIRED  Social Needs  . Financial resource strain: Not on file  . Food insecurity:    Worry: Not on file    Inability: Not on file  . Transportation needs:     Medical: Not on file    Non-medical: Not on file  Tobacco Use  . Smoking status: Former Smoker    Last attempt to quit: 03/22/1980    Years since quitting: 37.9  . Smokeless tobacco: Never Used  Substance and Sexual Activity  . Alcohol use: Yes    Alcohol/week: 9.0 standard drinks    Types: 3 Glasses of wine, 6 Cans of beer per week    Comment: 1 beer/mixed drink daily  . Drug use: No  . Sexual activity: Yes    Partners: Female    Birth control/protection: None  Lifestyle  . Physical activity:    Days per week: Not on file    Minutes per session: Not on file  . Stress: Not on file  Relationships  . Social connections:    Talks on phone: Not on file    Gets together: Not on file    Attends religious service: Not on file    Active member of club or organization: Not on file    Attends meetings of clubs or organizations: Not on file    Relationship status: Not on file  Other Topics Concern  . Not on file  Social History Narrative   Lives with wife.    Retired Forensic psychologist, Chief Strategy Officer.   Highest level of education:  JD    Allergies:  Allergies  Allergen Reactions  . Tetracycline Hcl     Metabolic Disorder Labs: No results found for: HGBA1C, MPG No results found for: PROLACTIN No results found for: CHOL, TRIG, HDL, CHOLHDL, VLDL, LDLCALC Lab Results  Component Value Date   TSH 1.728 06/25/2014    Therapeutic Level Labs: No results found for: LITHIUM No results found for: VALPROATE No components found for:  CBMZ  Current Medications: Current Outpatient Medications  Medication Sig Dispense Refill  . allopurinol (ZYLOPRIM) 300 MG tablet Take 300 mg by mouth daily.      Marland Kitchen amLODipine (NORVASC) 5 MG tablet     . aspirin 81 MG tablet Take 81 mg by mouth daily.      Marland Kitchen buPROPion (WELLBUTRIN XL) 150 MG 24 hr tablet TAKE 1 TABLET BY MOUTH ONCE DAILY IN THE MORNING 90 tablet 0  . carvedilol (COREG) 6.25 MG tablet     . Cetirizine HCl 10 MG CAPS Take by mouth.    .  lamoTRIgine (LAMICTAL) 150 MG tablet Take 1 tablet (150 mg total) by mouth daily. 90 tablet 0  . omeprazole (PRILOSEC) 20 MG capsule     . predniSONE (DELTASONE) 5 MG tablet Take 5 mg by mouth daily with breakfast.    . rivaroxaban (XARELTO) 10 MG TABS tablet Take 10  mg by mouth daily.    . simvastatin (ZOCOR) 40 MG tablet Take 40 mg by mouth daily.    . traZODone (DESYREL) 100 MG tablet Take 2 tablets (200 mg total) by mouth at bedtime. 60 tablet 1  . triamterene-hydrochlorothiazide (MAXZIDE-25) 37.5-25 MG tablet      No current facility-administered medications for this visit.      Musculoskeletal: Strength & Muscle Tone: within normal limits Gait & Station: normal Patient leans: N/A  Psychiatric Specialty Exam: ROS  Blood pressure 122/70, height 5\' 9"  (1.753 m), weight 151 lb (68.5 kg).There is no height or weight on file to calculate BMI.  General Appearance: Well Groomed  Eye Contact:  Good  Speech:  Clear and Coherent  Volume:  Normal  Mood:  Dysphoric  Affect:  Constricted  Thought Process:  Goal Directed  Orientation:  Full (Time, Place, and Person)  Thought Content: Rumination   Suicidal Thoughts:  No  Homicidal Thoughts:  No  Memory:  Immediate;   Good Recent;   Good Remote;   Fair  Judgement:  Good  Insight:  Good  Psychomotor Activity:  Normal  Concentration:  Concentration: Fair and Attention Span: Fair  Recall:  Good  Fund of Knowledge: Good  Language: Good  Akathisia:  No  Handed:  Right  AIMS (if indicated): not done  Assets:  Communication Skills Desire for Improvement Housing Resilience  ADL's:  Intact  Cognition: WNL  Sleep:  Fair   Screenings:   Assessment and Plan: Major depressive disorder, recurrent.  Generalized anxiety disorder.  Mood lability.  Discussed marital stress.  Encourage that if wife does not want to see a therapist but he should continue Mirna Mires for himself.  Patient acknowledged and agree.  However he like his wife to  see someone for borderline personality disorder.  He is trying to get appointment which was recommended by his friend.  She denies any side effects from the medication.  Continue Lamictal 150 mg daily, trazodone 200 mg at bedtime and Wellbutrin XL 150 mg daily.  He has no rash, itching, tremors or shakes.  Discussed safety concerns and recommended to call us back if is any question or any concern.  Follow-up in 3 months.   Kathlee Nations, MD 03/01/2018, 10:32 AM

## 2018-03-02 DIAGNOSIS — N521 Erectile dysfunction due to diseases classified elsewhere: Secondary | ICD-10-CM | POA: Diagnosis not present

## 2018-03-02 DIAGNOSIS — N486 Induration penis plastica: Secondary | ICD-10-CM | POA: Diagnosis not present

## 2018-03-02 DIAGNOSIS — N401 Enlarged prostate with lower urinary tract symptoms: Secondary | ICD-10-CM | POA: Diagnosis not present

## 2018-03-29 DIAGNOSIS — R0602 Shortness of breath: Secondary | ICD-10-CM | POA: Diagnosis not present

## 2018-03-29 DIAGNOSIS — E785 Hyperlipidemia, unspecified: Secondary | ICD-10-CM | POA: Diagnosis not present

## 2018-03-29 DIAGNOSIS — I10 Essential (primary) hypertension: Secondary | ICD-10-CM | POA: Diagnosis not present

## 2018-03-29 DIAGNOSIS — R001 Bradycardia, unspecified: Secondary | ICD-10-CM | POA: Diagnosis not present

## 2018-03-31 DIAGNOSIS — M19041 Primary osteoarthritis, right hand: Secondary | ICD-10-CM | POA: Diagnosis not present

## 2018-05-01 DIAGNOSIS — M5136 Other intervertebral disc degeneration, lumbar region: Secondary | ICD-10-CM | POA: Diagnosis not present

## 2018-05-01 DIAGNOSIS — M19041 Primary osteoarthritis, right hand: Secondary | ICD-10-CM | POA: Diagnosis not present

## 2018-05-01 DIAGNOSIS — M25522 Pain in left elbow: Secondary | ICD-10-CM | POA: Diagnosis not present

## 2018-05-01 DIAGNOSIS — M545 Low back pain: Secondary | ICD-10-CM | POA: Diagnosis not present

## 2018-05-05 DIAGNOSIS — D485 Neoplasm of uncertain behavior of skin: Secondary | ICD-10-CM | POA: Diagnosis not present

## 2018-05-05 DIAGNOSIS — L820 Inflamed seborrheic keratosis: Secondary | ICD-10-CM | POA: Diagnosis not present

## 2018-05-22 ENCOUNTER — Other Ambulatory Visit (HOSPITAL_COMMUNITY): Payer: Self-pay | Admitting: Psychiatry

## 2018-05-22 DIAGNOSIS — F331 Major depressive disorder, recurrent, moderate: Secondary | ICD-10-CM

## 2018-05-23 ENCOUNTER — Ambulatory Visit (INDEPENDENT_AMBULATORY_CARE_PROVIDER_SITE_OTHER): Payer: Medicare Other | Admitting: Psychology

## 2018-05-23 DIAGNOSIS — F4322 Adjustment disorder with anxiety: Secondary | ICD-10-CM

## 2018-05-31 ENCOUNTER — Ambulatory Visit (INDEPENDENT_AMBULATORY_CARE_PROVIDER_SITE_OTHER): Payer: Medicare Other | Admitting: Psychiatry

## 2018-05-31 ENCOUNTER — Other Ambulatory Visit: Payer: Self-pay

## 2018-05-31 ENCOUNTER — Encounter (HOSPITAL_COMMUNITY): Payer: Self-pay | Admitting: Psychiatry

## 2018-05-31 DIAGNOSIS — R4586 Emotional lability: Secondary | ICD-10-CM | POA: Diagnosis not present

## 2018-05-31 DIAGNOSIS — F331 Major depressive disorder, recurrent, moderate: Secondary | ICD-10-CM

## 2018-05-31 DIAGNOSIS — F411 Generalized anxiety disorder: Secondary | ICD-10-CM

## 2018-05-31 MED ORDER — LAMOTRIGINE 150 MG PO TABS: 150.0000 mg | ORAL_TABLET | Freq: Every day | ORAL | 0 refills | Status: DC

## 2018-05-31 MED ORDER — TRAZODONE HCL 100 MG PO TABS: 200.0000 mg | ORAL_TABLET | Freq: Every day | ORAL | 0 refills | Status: DC

## 2018-05-31 MED ORDER — BUPROPION HCL ER (XL) 150 MG PO TB24: ORAL_TABLET | ORAL | 0 refills | Status: DC

## 2018-05-31 NOTE — Progress Notes (Signed)
[place] MD/PA/NP OP Progress Note  05/31/2018 10:20 AM Colin Galvan.  MRN:  [id number]  Chief Complaint: I am doing good.  My relationship with my wife is doing much better.  Recently we had a trip to [place] and I had a good time.  HPI: Patient came for his appointment.  He endorsed much improvement in his relationship with his wife.  Patient told finally we had to sit down and talk about their issues and understand each other.  Patient also seeing Mirna Mires and his wife also goes some time with him.  He is sleeping good.  He recently had a trip to [place] with wife and their friends and had a good time.  He does not want to change medication.  Patient denies any recent irritability, anger, mood swing or any depressive thoughts.  He has no rash or itching.  He like to continue his current medication.  He denies any paranoia or any hallucination.  His energy level is good.  He is pending more time recently doing painting which he likes.  Visit Diagnosis:    ICD-10-CM   1. Moderate episode of recurrent major depressive disorder (HCC) F33.1 traZODone (DESYREL) 100 MG tablet    lamoTRIgine (LAMICTAL) 150 MG tablet    buPROPion (WELLBUTRIN XL) 150 MG 24 hr tablet  2. Labile mood R45.86 lamoTRIgine (LAMICTAL) 150 MG tablet  3. GAD (generalized anxiety disorder) F41.1 buPROPion (WELLBUTRIN XL) 150 MG 24 hr tablet    Past Psychiatric History: Reviewed H/O depression and anxiety. Tried Cymbalta, Klonopin and Prestiq. No h/o mania, psychosis, inpatient treatment or suicidal attempt. We tried increasing Lamictal but he had itching.   Past Medical History:  Past Medical History:  Diagnosis Date  . Allergic rhinitis   . Depression   . Family hx of colon cancer   . Family hx of melanoma   . Fatigue   . Gout   . Hx of colonic polyps   . Hyperlipidemia   . Hypertension   . Nephrolithiasis   . Other diseases of lung, not elsewhere classified   . Vitamin D deficiency     Past Surgical  History:  Procedure Laterality Date  . KNEE ARTHROSCOPY Right   . MELANOMA EXCISION N/A 07/2016  . ROTATOR CUFF REPAIR Right 02/2016  . SKIN CANCER EXCISION      Family Psychiatric History: Reviewed.  Family History:  Family History  Problem Relation Age of Onset  . Colon cancer Father        Deceased, 53  . Stroke Father   . Melanoma Mother        Deceased, 63  . Hypertension Mother   . Diabetes Mellitus II Brother   . Stroke Brother        Deceased, 4  . Depression Son     Social History:  Social History   Socioeconomic History  . Marital status: Married    Spouse name: Not on file  . Number of children: Not on file  . Years of education: Not on file  . Highest education level: Not on file  Occupational History  . Occupation: RETIRED  Social Needs  . Financial resource strain: Not on file  . Food insecurity:    Worry: Not on file    Inability: Not on file  . Transportation needs:    Medical: Not on file    Non-medical: Not on file  Tobacco Use  . Smoking status: Former Smoker    Last  attempt to quit: 03/22/1980    Years since quitting: 38.2  . Smokeless tobacco: Never Used  Substance and Sexual Activity  . Alcohol use: Yes    Alcohol/week: 9.0 standard drinks    Types: 3 Glasses of wine, 6 Cans of beer per week    Comment: 1 beer/mixed drink daily  . Drug use: No  . Sexual activity: Yes    Partners: Female    Birth control/protection: None  Lifestyle  . Physical activity:    Days per week: Not on file    Minutes per session: Not on file  . Stress: Not on file  Relationships  . Social connections:    Talks on phone: Not on file    Gets together: Not on file    Attends religious service: Not on file    Active member of club or organization: Not on file    Attends meetings of clubs or organizations: Not on file    Relationship status: Not on file  Other Topics Concern  . Not on file  Social History Narrative   Lives with wife.    Retired  Forensic psychologist, Chief Strategy Officer.   Highest level of education:  JD    Allergies:  Allergies  Allergen Reactions  . Tetracycline Hcl     Metabolic Disorder Labs: No results found for: HGBA1C, MPG No results found for: PROLACTIN No results found for: CHOL, TRIG, HDL, CHOLHDL, VLDL, LDLCALC Lab Results  Component Value Date   TSH 1.728 06/25/2014    Therapeutic Level Labs: No results found for: LITHIUM No results found for: VALPROATE No components found for:  CBMZ  Current Medications: Current Outpatient Medications  Medication Sig Dispense Refill  . allopurinol (ZYLOPRIM) 300 MG tablet Take 300 mg by mouth daily.      Marland Kitchen amLODipine (NORVASC) 5 MG tablet     . aspirin 81 MG tablet Take 81 mg by mouth daily.      Marland Kitchen buPROPion (WELLBUTRIN XL) 150 MG 24 hr tablet TAKE 1 TABLET BY MOUTH ONCE DAILY IN THE MORNING 90 tablet 0  . carvedilol (COREG) 6.25 MG tablet     . Cetirizine HCl 10 MG CAPS Take by mouth.    . gabapentin (NEURONTIN) 300 MG capsule Take by mouth.    . lamoTRIgine (LAMICTAL) 150 MG tablet Take 1 tablet (150 mg total) by mouth daily. 90 tablet 0  . omeprazole (PRILOSEC) 20 MG capsule     . predniSONE (DELTASONE) 5 MG tablet Take 5 mg by mouth daily with breakfast.    . rivaroxaban (XARELTO) 10 MG TABS tablet Take 10 mg by mouth daily.    . simvastatin (ZOCOR) 40 MG tablet Take 40 mg by mouth daily.    . traZODone (DESYREL) 100 MG tablet Take 2 tablets (200 mg total) by mouth at bedtime. 60 tablet 1  . triamterene-hydrochlorothiazide (MAXZIDE-25) 37.5-25 MG tablet      No current facility-administered medications for this visit.      Musculoskeletal: Strength & Muscle Tone: within normal limits Gait & Station: normal Patient leans: N/A  Psychiatric Specialty Exam: ROS  Blood pressure 126/74, pulse 80, temperature 98.1 F (36.7 C), height 5\' 9"  (1.753 m), weight 152 lb 12.8 oz (69.3 kg).Body mass index is 22.56 kg/m.  General Appearance: Well Groomed  Eye  Contact:  Good  Speech:  Clear and Coherent  Volume:  Normal  Mood:  Euthymic  Affect:  Congruent  Thought Process:  Goal Directed  Orientation:  Full (Time, Place,  and Person)  Thought Content: Logical   Suicidal Thoughts:  No  Homicidal Thoughts:  No  Memory:  Immediate;   Good Recent;   Good Remote;   Good  Judgement:  Good  Insight:  Good  Psychomotor Activity:  Normal  Concentration:  Concentration: Fair and Attention Span: Fair  Recall:  Good  Fund of Knowledge: Good  Language: Good  Akathisia:  No  Handed:  Right  AIMS (if indicated): not done  Assets:  Communication Skills Desire for Improvement Housing Resilience Transportation  ADL's:  Intact  Cognition: WNL  Sleep:  Good   Screenings:   Assessment and Plan: Ager depressive disorder, recurrent.  Generalized anxiety disorder.  Mood lability.  Patient doing very well since his relationship with his wife is much better.  Patient sees Mirna Mires in some time his wife also joint the session.  He does not want to change medication.  I will continue Lamictal 150 mg daily, trazodone 200 mg at bedtime and Wellbutrin XL 150 mg daily.  He has no rash, itching, tremors or shakes.  Recommended to call us back if he has any question or any concern.  Follow-up in 3 months.   Kathlee Nations, MD 05/31/2018, 10:20 AM

## 2018-06-05 DIAGNOSIS — H52203 Unspecified astigmatism, bilateral: Secondary | ICD-10-CM | POA: Diagnosis not present

## 2018-06-05 DIAGNOSIS — H2513 Age-related nuclear cataract, bilateral: Secondary | ICD-10-CM | POA: Diagnosis not present

## 2018-07-29 ENCOUNTER — Other Ambulatory Visit (HOSPITAL_COMMUNITY): Payer: Self-pay | Admitting: Psychiatry

## 2018-07-29 DIAGNOSIS — F331 Major depressive disorder, recurrent, moderate: Secondary | ICD-10-CM

## 2018-08-22 DIAGNOSIS — S60862A Insect bite (nonvenomous) of left wrist, initial encounter: Secondary | ICD-10-CM | POA: Diagnosis not present

## 2018-08-22 DIAGNOSIS — L570 Actinic keratosis: Secondary | ICD-10-CM | POA: Diagnosis not present

## 2018-08-22 DIAGNOSIS — Z85820 Personal history of malignant melanoma of skin: Secondary | ICD-10-CM | POA: Diagnosis not present

## 2018-08-22 DIAGNOSIS — L821 Other seborrheic keratosis: Secondary | ICD-10-CM | POA: Diagnosis not present

## 2018-08-22 DIAGNOSIS — D485 Neoplasm of uncertain behavior of skin: Secondary | ICD-10-CM | POA: Diagnosis not present

## 2018-08-22 DIAGNOSIS — Z85828 Personal history of other malignant neoplasm of skin: Secondary | ICD-10-CM | POA: Diagnosis not present

## 2018-08-28 DIAGNOSIS — M109 Gout, unspecified: Secondary | ICD-10-CM | POA: Diagnosis not present

## 2018-08-28 DIAGNOSIS — R6889 Other general symptoms and signs: Secondary | ICD-10-CM | POA: Diagnosis not present

## 2018-08-28 DIAGNOSIS — E785 Hyperlipidemia, unspecified: Secondary | ICD-10-CM | POA: Diagnosis not present

## 2018-08-28 DIAGNOSIS — N200 Calculus of kidney: Secondary | ICD-10-CM | POA: Diagnosis not present

## 2018-08-31 ENCOUNTER — Ambulatory Visit (INDEPENDENT_AMBULATORY_CARE_PROVIDER_SITE_OTHER): Payer: Medicare Other | Admitting: Psychiatry

## 2018-08-31 ENCOUNTER — Encounter (HOSPITAL_COMMUNITY): Payer: Self-pay | Admitting: Psychiatry

## 2018-08-31 ENCOUNTER — Other Ambulatory Visit: Payer: Self-pay

## 2018-08-31 DIAGNOSIS — F411 Generalized anxiety disorder: Secondary | ICD-10-CM | POA: Diagnosis not present

## 2018-08-31 DIAGNOSIS — R4586 Emotional lability: Secondary | ICD-10-CM

## 2018-08-31 DIAGNOSIS — F331 Major depressive disorder, recurrent, moderate: Secondary | ICD-10-CM

## 2018-08-31 MED ORDER — BUPROPION HCL ER (XL) 150 MG PO TB24: ORAL_TABLET | ORAL | 0 refills | Status: DC

## 2018-08-31 MED ORDER — LAMOTRIGINE 150 MG PO TABS: 150.0000 mg | ORAL_TABLET | Freq: Every day | ORAL | 0 refills | Status: DC

## 2018-08-31 NOTE — Progress Notes (Signed)
Virtual Visit via Telephone Note  I connected with Colin Galvan. on 08/31/18 at 10:00 AM EDT by telephone and verified that I am speaking with the correct person using two identifiers.   I discussed the limitations, risks, security and privacy concerns of performing an evaluation and management service by telephone and the availability of in person appointments. I also discussed with the patient that there may be a patient responsible charge related to this service. The patient expressed understanding and agreed to proceed.   History of Present Illness: Patient was evaluated by phone session.  He is doing well during COVID-19.  He feels he has more time to understand his relationship with the wife and things are going very well.  However he admitted sometimes miss his grandchildren who lives in [place].  He is in touch with them regularly.  He has not resume therapy with Lattie Haw florescence COVID-19.  He also feel he may not need it since things going very well with his wife.  He denies any irritability, anger, mania, psychosis or any hallucination.  His energy level is good.  He has no rash or itching.  He is sleeping good.  He like to continue his trazodone, Lamictal and Wellbutrin.  He denies any crying spells or any feeling of hopelessness.  His appetite is okay.  He does not want to change his medication.   Past Psychiatric History: Reviewed H/O depression and anxiety. Tried Cymbalta, Klonopin and Prestiq. Noh/o mania, psychosis, inpatient treatment or suicidal attempt. We tried increasing Lamictal but he had itching.    Psychiatric Specialty Exam: Physical Exam  ROS  There were no vitals taken for this visit.There is no height or weight on file to calculate BMI.  General Appearance: NA  Eye Contact:  NA  Speech:  Clear and Coherent  Volume:  Normal  Mood:  Euthymic  Affect:  NA  Thought Process:  Goal Directed  Orientation:  Full (Time, Place, and Person)  Thought Content:   WDL  Suicidal Thoughts:  No  Homicidal Thoughts:  No  Memory:  Immediate;   Good Recent;   Good Remote;   Good  Judgement:  Good  Insight:  Good  Psychomotor Activity:  Normal  Concentration:  Concentration: Fair and Attention Span: Fair  Recall:  Good  Fund of Knowledge:  Good  Language:  Good  Akathisia:  No  Handed:  Right  AIMS (if indicated):     Assets:  Communication Skills Desire for Improvement Housing Resilience Social Support  ADL's:  Intact  Cognition:  WNL  Sleep:   good      Assessment and Plan: Major depressive disorder, recurrent.  Generalized anxiety disorder.  Mood lability.  Patient is doing very well on his current medication.  He admitted much improvement in his relationship during COVID-19 time.  He does not want to change medication.  I will continue Lamictal 150 mg daily, trazodone 200 mg at bedtime and Wellbutrin XL 150 mg daily.  Discussed medication side effects and benefits.  He has no rash, itching, tremors or shakes.  Recommended to call us back if is any question or any concern.  Follow-up in 3 months.  Follow Up Instructions:    I discussed the assessment and treatment plan with the patient. The patient was provided an opportunity to ask questions and all were answered. The patient agreed with the plan and demonstrated an understanding of the instructions.   The patient was advised to call back or seek  an in-person evaluation if the symptoms worsen or if the condition fails to improve as anticipated.  I provided 15 minutes of non-face-to-face time during this encounter.   Kathlee Nations, MD

## 2018-09-05 ENCOUNTER — Other Ambulatory Visit (HOSPITAL_COMMUNITY): Payer: Self-pay | Admitting: Psychiatry

## 2018-09-05 DIAGNOSIS — F331 Major depressive disorder, recurrent, moderate: Secondary | ICD-10-CM

## 2018-09-06 DIAGNOSIS — H2513 Age-related nuclear cataract, bilateral: Secondary | ICD-10-CM | POA: Diagnosis not present

## 2018-09-07 DIAGNOSIS — G5762 Lesion of plantar nerve, left lower limb: Secondary | ICD-10-CM | POA: Diagnosis not present

## 2018-09-07 DIAGNOSIS — M7710 Lateral epicondylitis, unspecified elbow: Secondary | ICD-10-CM | POA: Diagnosis not present

## 2018-09-12 ENCOUNTER — Telehealth (HOSPITAL_COMMUNITY): Payer: Self-pay

## 2018-09-12 DIAGNOSIS — F331 Major depressive disorder, recurrent, moderate: Secondary | ICD-10-CM

## 2018-09-12 MED ORDER — TRAZODONE HCL 100 MG PO TABS: 200.0000 mg | ORAL_TABLET | Freq: Every day | ORAL | 0 refills | Status: DC

## 2018-09-12 NOTE — Telephone Encounter (Signed)
Patient called requesting a refill on his Trazodone 100mg . Nothing was sent in at his appointment on 6/11. He has a scheduled appointment on 11/30/18. Do you want me to send in #180 0 refills to get him to his appointment or #30 0 refills? He uses [facility] in [place], [place]. Please review and advise. Thank you.

## 2018-09-12 NOTE — Telephone Encounter (Signed)
I sent prescription to [facility].  Please inform the patient to pick up the medication.

## 2018-09-27 DIAGNOSIS — I10 Essential (primary) hypertension: Secondary | ICD-10-CM | POA: Diagnosis not present

## 2018-09-27 DIAGNOSIS — R001 Bradycardia, unspecified: Secondary | ICD-10-CM | POA: Diagnosis not present

## 2018-09-27 DIAGNOSIS — R0602 Shortness of breath: Secondary | ICD-10-CM | POA: Diagnosis not present

## 2018-09-27 DIAGNOSIS — E119 Type 2 diabetes mellitus without complications: Secondary | ICD-10-CM | POA: Diagnosis not present

## 2018-09-27 DIAGNOSIS — R55 Syncope and collapse: Secondary | ICD-10-CM | POA: Diagnosis not present

## 2018-09-27 DIAGNOSIS — E785 Hyperlipidemia, unspecified: Secondary | ICD-10-CM | POA: Diagnosis not present

## 2018-10-03 DIAGNOSIS — H2511 Age-related nuclear cataract, right eye: Secondary | ICD-10-CM | POA: Diagnosis not present

## 2018-10-03 DIAGNOSIS — H25811 Combined forms of age-related cataract, right eye: Secondary | ICD-10-CM | POA: Diagnosis not present

## 2018-10-13 DIAGNOSIS — R972 Elevated prostate specific antigen [PSA]: Secondary | ICD-10-CM | POA: Diagnosis not present

## 2018-10-19 DIAGNOSIS — R972 Elevated prostate specific antigen [PSA]: Secondary | ICD-10-CM | POA: Diagnosis not present

## 2018-10-19 DIAGNOSIS — R351 Nocturia: Secondary | ICD-10-CM | POA: Diagnosis not present

## 2018-10-19 DIAGNOSIS — N401 Enlarged prostate with lower urinary tract symptoms: Secondary | ICD-10-CM | POA: Diagnosis not present

## 2018-10-26 DIAGNOSIS — E785 Hyperlipidemia, unspecified: Secondary | ICD-10-CM | POA: Diagnosis not present

## 2018-10-26 DIAGNOSIS — I10 Essential (primary) hypertension: Secondary | ICD-10-CM | POA: Diagnosis not present

## 2018-10-26 DIAGNOSIS — R739 Hyperglycemia, unspecified: Secondary | ICD-10-CM | POA: Diagnosis not present

## 2018-10-26 DIAGNOSIS — R001 Bradycardia, unspecified: Secondary | ICD-10-CM | POA: Diagnosis not present

## 2018-10-31 DIAGNOSIS — H25812 Combined forms of age-related cataract, left eye: Secondary | ICD-10-CM | POA: Diagnosis not present

## 2018-10-31 DIAGNOSIS — H2512 Age-related nuclear cataract, left eye: Secondary | ICD-10-CM | POA: Diagnosis not present

## 2018-11-03 DIAGNOSIS — M542 Cervicalgia: Secondary | ICD-10-CM | POA: Diagnosis not present

## 2018-11-03 DIAGNOSIS — M5412 Radiculopathy, cervical region: Secondary | ICD-10-CM | POA: Diagnosis not present

## 2018-11-03 DIAGNOSIS — M7711 Lateral epicondylitis, right elbow: Secondary | ICD-10-CM | POA: Diagnosis not present

## 2018-11-03 DIAGNOSIS — M5090 Cervical disc disorder, unspecified, unspecified cervical region: Secondary | ICD-10-CM | POA: Diagnosis not present

## 2018-11-15 DIAGNOSIS — Z23 Encounter for immunization: Secondary | ICD-10-CM | POA: Diagnosis not present

## 2018-11-20 DIAGNOSIS — M5412 Radiculopathy, cervical region: Secondary | ICD-10-CM | POA: Diagnosis not present

## 2018-11-24 ENCOUNTER — Other Ambulatory Visit (HOSPITAL_COMMUNITY): Payer: Self-pay | Admitting: Psychiatry

## 2018-11-24 DIAGNOSIS — F331 Major depressive disorder, recurrent, moderate: Secondary | ICD-10-CM

## 2018-11-28 DIAGNOSIS — M5090 Cervical disc disorder, unspecified, unspecified cervical region: Secondary | ICD-10-CM | POA: Diagnosis not present

## 2018-11-28 DIAGNOSIS — M5412 Radiculopathy, cervical region: Secondary | ICD-10-CM | POA: Diagnosis not present

## 2018-11-30 ENCOUNTER — Ambulatory Visit (INDEPENDENT_AMBULATORY_CARE_PROVIDER_SITE_OTHER): Payer: Medicare Other | Admitting: Psychiatry

## 2018-11-30 ENCOUNTER — Other Ambulatory Visit: Payer: Self-pay

## 2018-11-30 ENCOUNTER — Encounter (HOSPITAL_COMMUNITY): Payer: Self-pay | Admitting: Psychiatry

## 2018-11-30 DIAGNOSIS — F331 Major depressive disorder, recurrent, moderate: Secondary | ICD-10-CM

## 2018-11-30 DIAGNOSIS — R4586 Emotional lability: Secondary | ICD-10-CM | POA: Diagnosis not present

## 2018-11-30 DIAGNOSIS — F411 Generalized anxiety disorder: Secondary | ICD-10-CM

## 2018-11-30 MED ORDER — BUPROPION HCL ER (XL) 150 MG PO TB24: ORAL_TABLET | ORAL | 0 refills | Status: DC

## 2018-11-30 MED ORDER — LAMOTRIGINE 150 MG PO TABS: 150.0000 mg | ORAL_TABLET | Freq: Every day | ORAL | 0 refills | Status: DC

## 2018-11-30 MED ORDER — TRAZODONE HCL 100 MG PO TABS: 200.0000 mg | ORAL_TABLET | Freq: Every day | ORAL | 0 refills | Status: DC

## 2018-11-30 NOTE — Progress Notes (Signed)
Virtual Visit via Telephone Note  I connected with Colin Galvan. on 11/30/18 at  9:40 AM EDT by telephone and verified that I am speaking with the correct person using two identifiers.   I discussed the limitations, risks, security and privacy concerns of performing an evaluation and management service by telephone and the availability of in person appointments. I also discussed with the patient that there may be a patient responsible charge related to this service. The patient expressed understanding and agreed to proceed.   History of Present Illness: Patient was evaluated by phone session.  He is doing very well on his current medication.  Recently at cataract surgery for his both eyes and given medicine.  Today he is going to have a follow-up appointment with his eye doctor.  He reported a good relationship with his wife who is actually sitting with him while he was driving to his eye doctor's appointment.  Denies any irritability, anger, mood swing or insomnia.  He is taking gabapentin 300 mg though prescribed 3 times a day but he only takes 1 since he cannot tolerate more than 1/day.  Denies any feeling of hopelessness or worthlessness.  His appetite is okay.  His energy level is good.  He reported his weight is a stable however he like to have his weight gain since he is underweight.  He does not want to change medication since it is working very well.  He regret not able to start golf but hoping 1 day.   Past Psychiatric History:Reviewed H/Odepressionand anxiety.Tried Cymbalta, Klonopin and Prestiq. Noh/omania, psychosis, inpatient treatment or suicidal attempt.We tried increasing Lamictal but hehaditching.   Psychiatric Specialty Exam: Physical Exam  ROS  There were no vitals taken for this visit.There is no height or weight on file to calculate BMI.  General Appearance: NA  Eye Contact:  NA  Speech:  Clear and Coherent  Volume:  Normal  Mood:  Euthymic  Affect:  NA   Thought Process:  Goal Directed  Orientation:  Full (Time, Place, and Person)  Thought Content:  WDL and Logical  Suicidal Thoughts:  No  Homicidal Thoughts:  No  Memory:  Immediate;   Good Recent;   Good Remote;   Good  Judgement:  Good  Insight:  Good  Psychomotor Activity:  Normal  Concentration:  Concentration: Good and Attention Span: Good  Recall:  Good  Fund of Knowledge:  Good  Language:  Good  Akathisia:  No  Handed:  Right  AIMS (if indicated):     Assets:  Communication Skills Desire for Improvement Financial Resources/Insurance Housing Resilience Social Support Transportation  ADL's:  Intact  Cognition:  WNL  Sleep:         Assessment and Plan: Major depressive disorder, recurrent.  Generalized anxiety disorder.  Mood lability.  Patient is a stable on his current medication.  Does not want to change doses or reduce medication.  Continue Lamictal 150 mg daily, trazodone 200 mg daily at bedtime and Wellbutrin XL 150 mg daily.  He has no rash, itching, tremors or shakes.  Recommended to call us back if he has any question or any concern.  Follow-up in 3 months.   Follow Up Instructions:    I discussed the assessment and treatment plan with the patient. The patient was provided an opportunity to ask questions and all were answered. The patient agreed with the plan and demonstrated an understanding of the instructions.   The patient was advised to call back  or seek an in-person evaluation if the symptoms worsen or if the condition fails to improve as anticipated.  I provided 20 minutes of non-face-to-face time during this encounter.   Kathlee Nations, MD

## 2018-12-05 DIAGNOSIS — R42 Dizziness and giddiness: Secondary | ICD-10-CM | POA: Diagnosis not present

## 2018-12-05 DIAGNOSIS — R0602 Shortness of breath: Secondary | ICD-10-CM | POA: Diagnosis not present

## 2018-12-05 DIAGNOSIS — R001 Bradycardia, unspecified: Secondary | ICD-10-CM | POA: Diagnosis not present

## 2018-12-05 DIAGNOSIS — R55 Syncope and collapse: Secondary | ICD-10-CM | POA: Diagnosis not present

## 2018-12-07 DIAGNOSIS — M542 Cervicalgia: Secondary | ICD-10-CM | POA: Diagnosis not present

## 2018-12-07 DIAGNOSIS — N183 Chronic kidney disease, stage 3 (moderate): Secondary | ICD-10-CM | POA: Diagnosis not present

## 2018-12-07 DIAGNOSIS — M5030 Other cervical disc degeneration, unspecified cervical region: Secondary | ICD-10-CM | POA: Diagnosis not present

## 2018-12-13 DIAGNOSIS — M542 Cervicalgia: Secondary | ICD-10-CM | POA: Diagnosis not present

## 2018-12-13 DIAGNOSIS — M5030 Other cervical disc degeneration, unspecified cervical region: Secondary | ICD-10-CM | POA: Diagnosis not present

## 2018-12-14 DIAGNOSIS — M5030 Other cervical disc degeneration, unspecified cervical region: Secondary | ICD-10-CM | POA: Diagnosis not present

## 2018-12-14 DIAGNOSIS — M542 Cervicalgia: Secondary | ICD-10-CM | POA: Diagnosis not present

## 2018-12-19 DIAGNOSIS — M542 Cervicalgia: Secondary | ICD-10-CM | POA: Diagnosis not present

## 2018-12-19 DIAGNOSIS — R55 Syncope and collapse: Secondary | ICD-10-CM | POA: Diagnosis not present

## 2018-12-19 DIAGNOSIS — M5030 Other cervical disc degeneration, unspecified cervical region: Secondary | ICD-10-CM | POA: Diagnosis not present

## 2018-12-19 DIAGNOSIS — M5412 Radiculopathy, cervical region: Secondary | ICD-10-CM | POA: Diagnosis not present

## 2018-12-19 DIAGNOSIS — R7301 Impaired fasting glucose: Secondary | ICD-10-CM | POA: Diagnosis not present

## 2018-12-19 DIAGNOSIS — R001 Bradycardia, unspecified: Secondary | ICD-10-CM | POA: Diagnosis not present

## 2018-12-19 DIAGNOSIS — R0602 Shortness of breath: Secondary | ICD-10-CM | POA: Diagnosis not present

## 2018-12-19 DIAGNOSIS — M5090 Cervical disc disorder, unspecified, unspecified cervical region: Secondary | ICD-10-CM | POA: Diagnosis not present

## 2018-12-19 DIAGNOSIS — I10 Essential (primary) hypertension: Secondary | ICD-10-CM | POA: Diagnosis not present

## 2018-12-20 DIAGNOSIS — R7301 Impaired fasting glucose: Secondary | ICD-10-CM | POA: Diagnosis not present

## 2018-12-20 DIAGNOSIS — I10 Essential (primary) hypertension: Secondary | ICD-10-CM | POA: Diagnosis not present

## 2018-12-20 DIAGNOSIS — N183 Chronic kidney disease, stage 3 (moderate): Secondary | ICD-10-CM | POA: Diagnosis not present

## 2018-12-20 DIAGNOSIS — M109 Gout, unspecified: Secondary | ICD-10-CM | POA: Diagnosis not present

## 2018-12-21 DIAGNOSIS — M542 Cervicalgia: Secondary | ICD-10-CM | POA: Diagnosis not present

## 2018-12-21 DIAGNOSIS — M5030 Other cervical disc degeneration, unspecified cervical region: Secondary | ICD-10-CM | POA: Diagnosis not present

## 2018-12-27 DIAGNOSIS — R7301 Impaired fasting glucose: Secondary | ICD-10-CM | POA: Diagnosis not present

## 2018-12-27 DIAGNOSIS — E559 Vitamin D deficiency, unspecified: Secondary | ICD-10-CM | POA: Diagnosis not present

## 2018-12-27 DIAGNOSIS — N182 Chronic kidney disease, stage 2 (mild): Secondary | ICD-10-CM | POA: Diagnosis not present

## 2018-12-28 DIAGNOSIS — M542 Cervicalgia: Secondary | ICD-10-CM | POA: Diagnosis not present

## 2018-12-28 DIAGNOSIS — M5030 Other cervical disc degeneration, unspecified cervical region: Secondary | ICD-10-CM | POA: Diagnosis not present

## 2019-01-02 DIAGNOSIS — M542 Cervicalgia: Secondary | ICD-10-CM | POA: Diagnosis not present

## 2019-01-02 DIAGNOSIS — M5030 Other cervical disc degeneration, unspecified cervical region: Secondary | ICD-10-CM | POA: Diagnosis not present

## 2019-01-04 DIAGNOSIS — M5030 Other cervical disc degeneration, unspecified cervical region: Secondary | ICD-10-CM | POA: Diagnosis not present

## 2019-01-04 DIAGNOSIS — M542 Cervicalgia: Secondary | ICD-10-CM | POA: Diagnosis not present

## 2019-01-08 DIAGNOSIS — M542 Cervicalgia: Secondary | ICD-10-CM | POA: Diagnosis not present

## 2019-01-08 DIAGNOSIS — M5030 Other cervical disc degeneration, unspecified cervical region: Secondary | ICD-10-CM | POA: Diagnosis not present

## 2019-01-09 ENCOUNTER — Other Ambulatory Visit: Payer: Self-pay

## 2019-01-09 ENCOUNTER — Encounter (HOSPITAL_COMMUNITY): Payer: Self-pay | Admitting: Psychiatry

## 2019-01-09 ENCOUNTER — Ambulatory Visit (INDEPENDENT_AMBULATORY_CARE_PROVIDER_SITE_OTHER): Payer: Medicare Other | Admitting: Psychiatry

## 2019-01-09 DIAGNOSIS — F411 Generalized anxiety disorder: Secondary | ICD-10-CM | POA: Diagnosis not present

## 2019-01-09 DIAGNOSIS — R4586 Emotional lability: Secondary | ICD-10-CM

## 2019-01-09 DIAGNOSIS — F331 Major depressive disorder, recurrent, moderate: Secondary | ICD-10-CM

## 2019-01-09 MED ORDER — LAMOTRIGINE 200 MG PO TABS: 200.0000 mg | ORAL_TABLET | Freq: Every day | ORAL | 1 refills | Status: DC

## 2019-01-09 NOTE — Progress Notes (Signed)
Virtual Visit via Telephone Note  I connected with Colin Galvan. on 01/09/19 at 11:20 AM EDT by telephone and verified that I am speaking with the correct person using two identifiers.   I discussed the limitations, risks, security and privacy concerns of performing an evaluation and management service by telephone and the availability of in person appointments. I also discussed with the patient that there may be a patient responsible charge related to this service. The patient expressed understanding and agreed to proceed.   History of Present Illness: Patient requested earlier appointment.  He was evaluated by phone session.  He admitted lately feeling more irritable and get frustrated for no reason.  He also endorsed that he has been doing too much.  Going grocery, painting 3-4 times a week and also involved in other volunteer work.  He feels all the time go go and he wants to relax himself.  His wife also concerned about his short temper.  He is sleeping okay.  He denies any hallucination, paranoia, suicidal thoughts or homicidal thought.  He is taking gabapentin 300 mg a day.  His appetite is okay.  He denies any feeling of hopelessness or worthlessness.  His energy level is increased sometimes.  He has no rash, itching tremors or shakes.  He is compliant with Lamictal, trazodone and Wellbutrin.   Past Psychiatric History:Reviewed H/Odepressionand anxiety.Tried Cymbalta, Klonopin and Prestiq. Noh/omania, psychosis, inpatient treatment or suicidal attempt.We tried increasing Lamictal but hehaditching.  Psychiatric Specialty Exam: Physical Exam  ROS  There were no vitals taken for this visit.There is no height or weight on file to calculate BMI.  General Appearance: NA  Eye Contact:  NA  Speech:  Clear and Coherent and Normal Rate  Volume:  Normal  Mood:  Irritable  Affect:  NA  Thought Process:  Goal Directed  Orientation:  Full (Time, Place, and Person)  Thought  Content:  Rumination  Suicidal Thoughts:  No  Homicidal Thoughts:  No  Memory:  Immediate;   Fair Recent;   Good Remote;   Good  Judgement:  Good  Insight:  Good  Psychomotor Activity:  NA  Concentration:  Concentration: Fair and Attention Span: Fair  Recall:  Good  Fund of Knowledge:  Good  Language:  Good  Akathisia:  No  Handed:  Right  AIMS (if indicated):     Assets:  Communication Skills Desire for Improvement Housing Resilience Social Support  ADL's:  Intact  Cognition:  WNL  Sleep:   ok      Assessment and Plan: Major depressive disorder, recurrent.  Generalized anxiety disorder.  Mood lability.  We discussed that we may address his mood lability by increasing the Lamictal dose to 200.  So far is tolerating well and he has no rash or any itching.  However if he started to side effects then we will consider lowering the Lamictal.  He agreed with the plan.  Continue Wellbutrin XL 150 mg daily, trazodone 200 mg daily and the new dose of Lamictal 200 mg daily.  Reminded about the rash that if the Lamictal cause rash then he need to stop the medication immediately.  Follow-up in 6 weeks.  Follow Up Instructions:    I discussed the assessment and treatment plan with the patient. The patient was provided an opportunity to ask questions and all were answered. The patient agreed with the plan and demonstrated an understanding of the instructions.   The patient was advised to call back or seek  an in-person evaluation if the symptoms worsen or if the condition fails to improve as anticipated.  I provided 20 minutes of non-face-to-face time during this encounter.   Kathlee Nations, MD

## 2019-01-10 DIAGNOSIS — M5030 Other cervical disc degeneration, unspecified cervical region: Secondary | ICD-10-CM | POA: Diagnosis not present

## 2019-01-10 DIAGNOSIS — M542 Cervicalgia: Secondary | ICD-10-CM | POA: Diagnosis not present

## 2019-02-08 DIAGNOSIS — G5762 Lesion of plantar nerve, left lower limb: Secondary | ICD-10-CM | POA: Diagnosis not present

## 2019-02-08 DIAGNOSIS — M25572 Pain in left ankle and joints of left foot: Secondary | ICD-10-CM | POA: Diagnosis not present

## 2019-02-12 ENCOUNTER — Other Ambulatory Visit (HOSPITAL_COMMUNITY): Payer: Self-pay | Admitting: Psychiatry

## 2019-02-12 DIAGNOSIS — F331 Major depressive disorder, recurrent, moderate: Secondary | ICD-10-CM

## 2019-02-14 ENCOUNTER — Other Ambulatory Visit (HOSPITAL_COMMUNITY): Payer: Self-pay | Admitting: Psychiatry

## 2019-02-14 DIAGNOSIS — F331 Major depressive disorder, recurrent, moderate: Secondary | ICD-10-CM

## 2019-02-20 DIAGNOSIS — M109 Gout, unspecified: Secondary | ICD-10-CM | POA: Diagnosis not present

## 2019-02-20 DIAGNOSIS — J449 Chronic obstructive pulmonary disease, unspecified: Secondary | ICD-10-CM | POA: Diagnosis not present

## 2019-02-20 DIAGNOSIS — J301 Allergic rhinitis due to pollen: Secondary | ICD-10-CM | POA: Diagnosis not present

## 2019-02-20 DIAGNOSIS — N200 Calculus of kidney: Secondary | ICD-10-CM | POA: Diagnosis not present

## 2019-02-22 ENCOUNTER — Other Ambulatory Visit: Payer: Self-pay

## 2019-02-22 ENCOUNTER — Ambulatory Visit (HOSPITAL_COMMUNITY): Payer: Medicare Other | Admitting: Psychiatry

## 2019-02-22 DIAGNOSIS — L299 Pruritus, unspecified: Secondary | ICD-10-CM | POA: Diagnosis not present

## 2019-02-22 DIAGNOSIS — Z85828 Personal history of other malignant neoplasm of skin: Secondary | ICD-10-CM | POA: Diagnosis not present

## 2019-02-22 DIAGNOSIS — L570 Actinic keratosis: Secondary | ICD-10-CM | POA: Diagnosis not present

## 2019-02-22 NOTE — Progress Notes (Unsigned)
No show

## 2019-02-23 ENCOUNTER — Other Ambulatory Visit (HOSPITAL_COMMUNITY): Payer: Self-pay | Admitting: Psychiatry

## 2019-02-23 DIAGNOSIS — F331 Major depressive disorder, recurrent, moderate: Secondary | ICD-10-CM

## 2019-02-27 ENCOUNTER — Encounter (HOSPITAL_COMMUNITY): Payer: Self-pay | Admitting: Psychiatry

## 2019-02-27 ENCOUNTER — Other Ambulatory Visit: Payer: Self-pay

## 2019-02-27 ENCOUNTER — Ambulatory Visit (INDEPENDENT_AMBULATORY_CARE_PROVIDER_SITE_OTHER): Payer: Medicare Other | Admitting: Psychiatry

## 2019-02-27 DIAGNOSIS — F411 Generalized anxiety disorder: Secondary | ICD-10-CM | POA: Diagnosis not present

## 2019-02-27 DIAGNOSIS — R4586 Emotional lability: Secondary | ICD-10-CM

## 2019-02-27 DIAGNOSIS — F331 Major depressive disorder, recurrent, moderate: Secondary | ICD-10-CM | POA: Diagnosis not present

## 2019-02-27 DIAGNOSIS — Z87891 Personal history of nicotine dependence: Secondary | ICD-10-CM

## 2019-02-27 MED ORDER — LAMOTRIGINE 200 MG PO TABS: 200.0000 mg | ORAL_TABLET | Freq: Every day | ORAL | 0 refills | Status: DC

## 2019-02-27 MED ORDER — TRAZODONE HCL 100 MG PO TABS: 200.0000 mg | ORAL_TABLET | Freq: Every day | ORAL | 0 refills | Status: DC

## 2019-02-27 MED ORDER — BUPROPION HCL ER (XL) 150 MG PO TB24: ORAL_TABLET | ORAL | 0 refills | Status: DC

## 2019-02-27 NOTE — Progress Notes (Signed)
Virtual Visit via Telephone Note  I connected with Colin Galvan. on 02/27/19 at 10:40 AM EST by telephone and verified that I am speaking with the correct person using two identifiers.   I discussed the limitations, risks, security and privacy concerns of performing an evaluation and management service by telephone and the availability of in person appointments. I also discussed with the patient that there may be a patient responsible charge related to this service. The patient expressed understanding and agreed to proceed.   History of Present Illness: Patient was evaluated by phone session.  On the last visit we increase Lamictal to 200 mg he is doing much better.  He still have irritability but it is not as bad.  He is sleeping good with the trazodone.  He still enjoys painting and doing groceries but does not feed frustrated and overwhelmed.  He has no rash, itching or any shakes.  He could not do family Thanksgiving because of pandemic.  His daughter lives in [place].  He feels the current medicine is working.  His appetite is okay.  His weight is unchanged from the past.  He is hoping that he can see family members on Christmas.  Past Psychiatric History:Reviewed H/Odepressionand anxiety.Tried Cymbalta, Klonopin and Prestiq. Noh/omania, psychosis, inpatient treatment or suicidal attempt.We tried increasing Lamictal but hehaditching.   Psychiatric Specialty Exam: Physical Exam  ROS  There were no vitals taken for this visit.There is no height or weight on file to calculate BMI.  General Appearance: NA  Eye Contact:  NA  Speech:  Clear and Coherent  Volume:  Normal  Mood:  Euthymic  Affect:  NA  Thought Process:  Goal Directed  Orientation:  Full (Time, Place, and Person)  Thought Content:  WDL and Logical  Suicidal Thoughts:  No  Homicidal Thoughts:  No  Memory:  Immediate;   Fair Recent;   Good Remote;   Good  Judgement:  Intact  Insight:  Present   Psychomotor Activity:  NA  Concentration:  Concentration: Fair and Attention Span: Fair  Recall:  Good  Fund of Knowledge:  Good  Language:  Good  Akathisia:  No  Handed:  Right  AIMS (if indicated):     Assets:  Communication Skills Desire for Improvement Housing Resilience Social Support  ADL's:  Intact  Cognition:  WNL  Sleep:   ok      Assessment and Plan: Major depressive disorder, recurrent.  Generalized anxiety disorder.  Mood lability.  Patient doing much better since we increase the dose of Lamictal to 200 mg.  He has no rash or any itching.  Continue current dose and continue Wellbutrin XL 150 mg daily, trazodone 200 g at bedtime.  Recommended to call us back if is any question of any concern.  Follow-up in 3 months.  Follow Up Instructions:    I discussed the assessment and treatment plan with the patient. The patient was provided an opportunity to ask questions and all were answered. The patient agreed with the plan and demonstrated an understanding of the instructions.   The patient was advised to call back or seek an in-person evaluation if the symptoms worsen or if the condition fails to improve as anticipated.  I provided 15 minutes of non-face-to-face time during this encounter.   Colin Nations, MD

## 2019-05-13 IMAGING — CT CT NECK W/ CM
4 series · 14 of 33 positions shown, 17 images · IV contrast (iopamidol)
Comparison: None.

CLINICAL DATA: Initial evaluation for chronic laryngitis for 2
weeks.

EXAM:
CT NECK WITH CONTRAST
TECHNIQUE: Multidetector CT imaging of the neck was performed using the
standard protocol following the bolus administration of intravenous
contrast.
CONTRAST:  <See Chart> [id number] IOPAMIDOL (0V0P28-6ZZ) INJECTION
61%

[Series 3: neck 2.0 i31s 3 · axial · 0.51mm/px · z∈[-282,-106]mm · 5 of 132 slices shown, 7 images]
[im 22/132  soft-tissue]
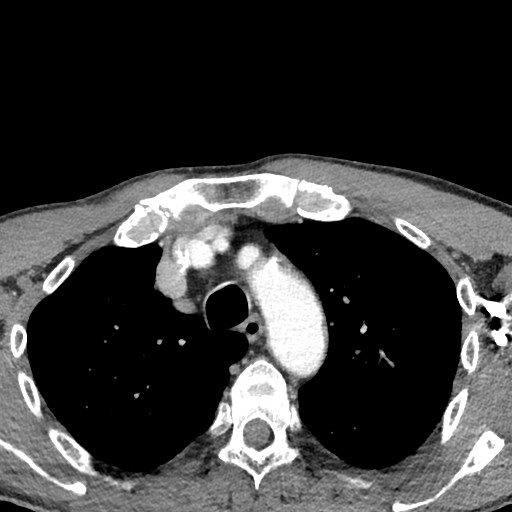
[im 22/132  bone]
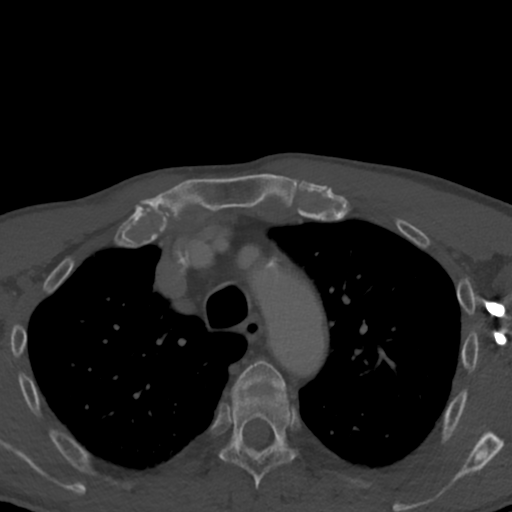
[im 44/132  bone]
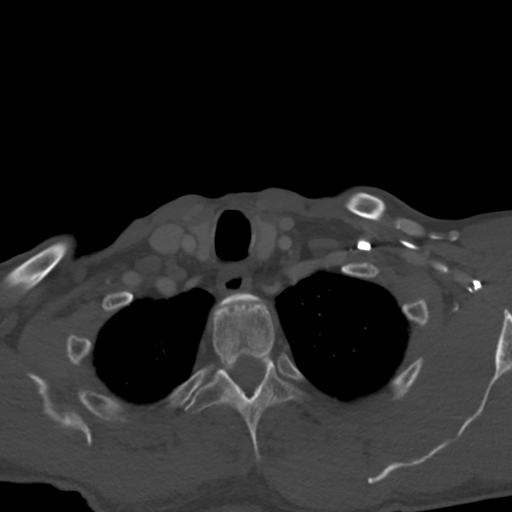
[im 66/132  bone]
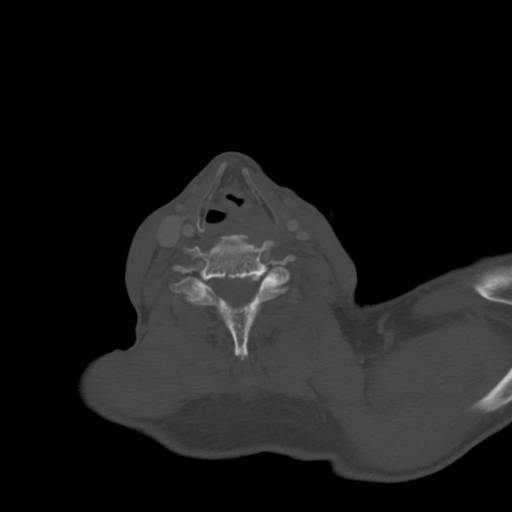
[im 88/132  bone]
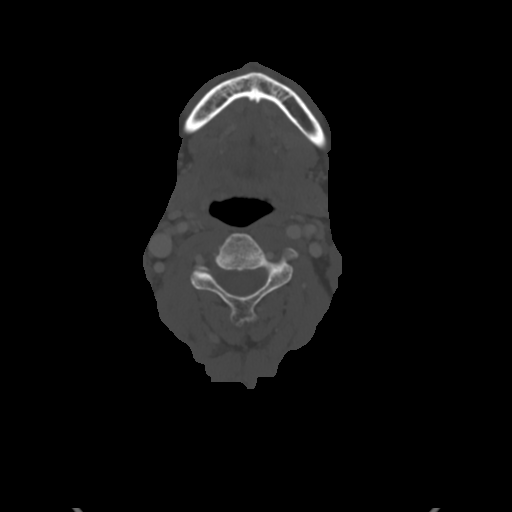
[im 110/132  soft-tissue]
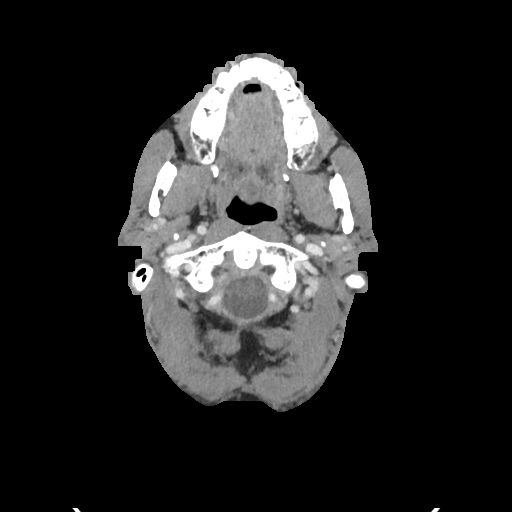
[im 110/132  bone]
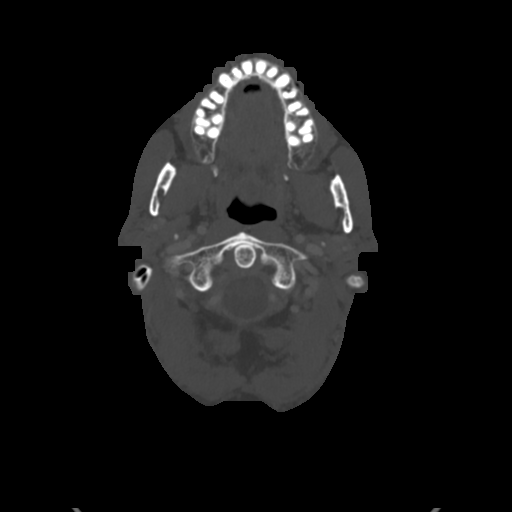

[Series 6: coronal st · coronal · 0.49mm/px · 3 of 133 slices shown]
[im 27/133  bone]
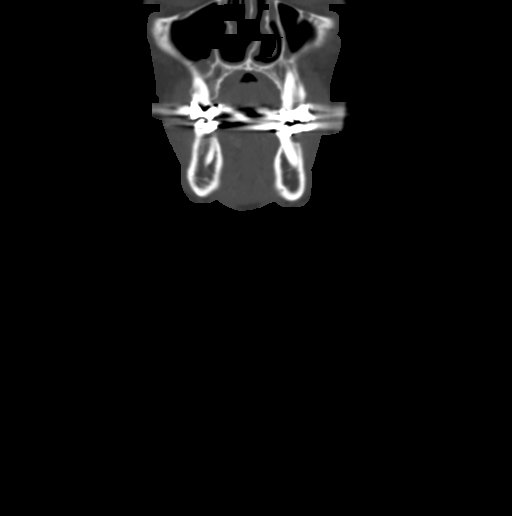
[im 53/133  bone]
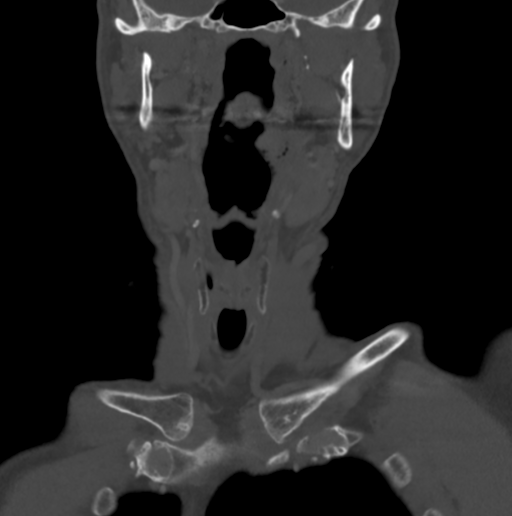
[im 80/133  bone]
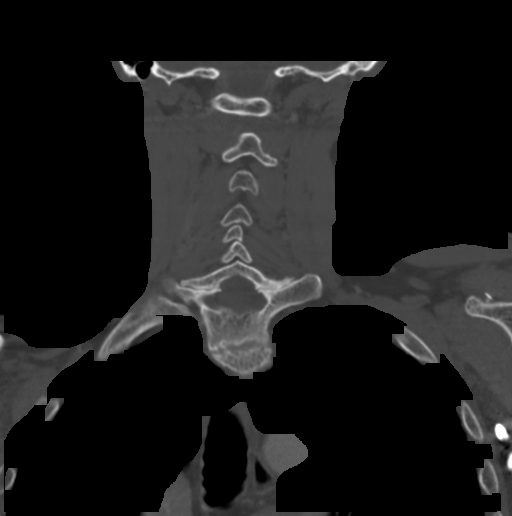

[Series 7: sagittal st · sagittal · 0.49mm/px · 5 of 101 slices shown, 6 images]
[im 34/101  bone]
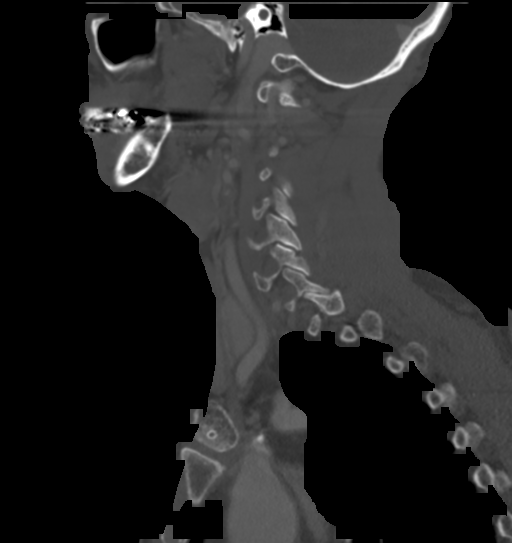
[im 42/101  bone]
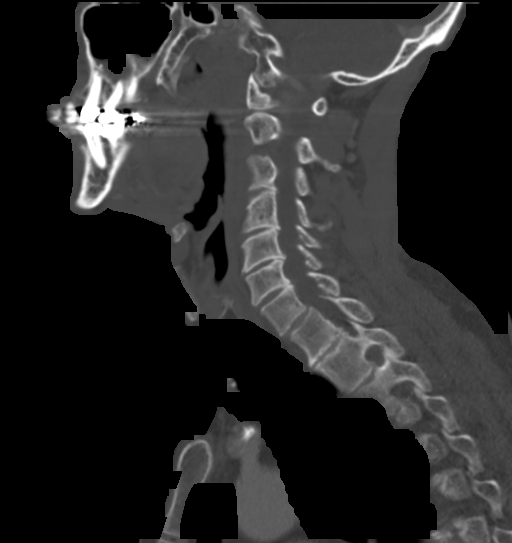
[im 51/101  soft-tissue]
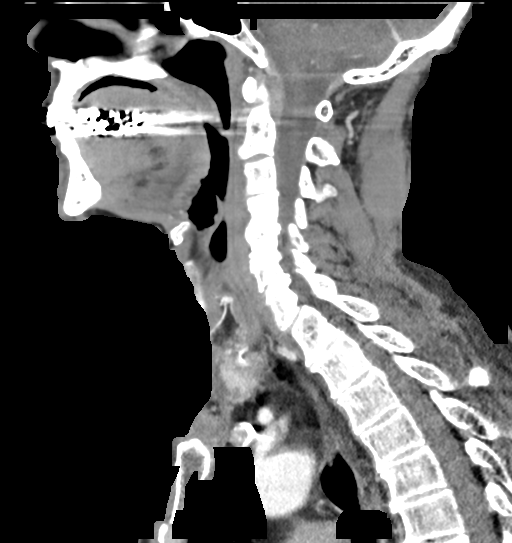
[im 51/101  bone]
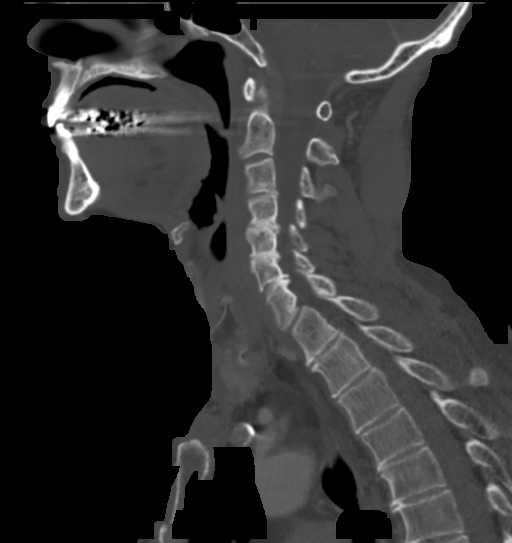
[im 59/101  bone]
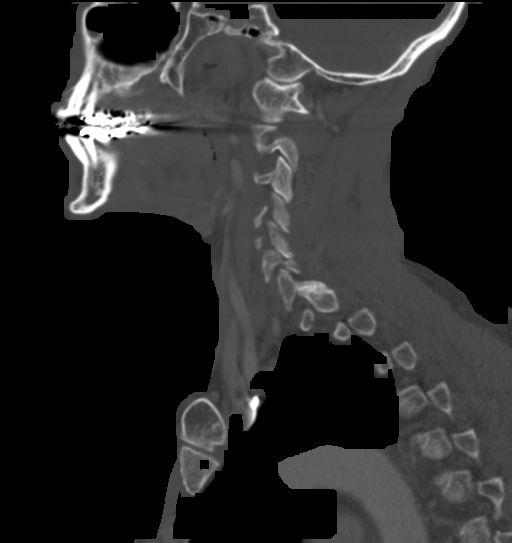
[im 67/101  bone]
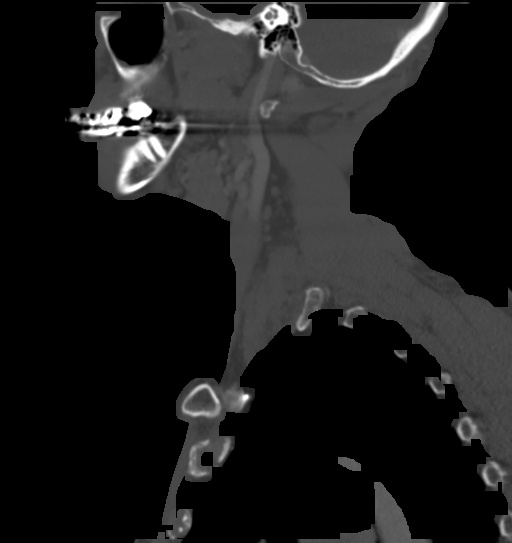

[Series 8: orthogonal st · axial · 0.39mm/px · 1 of 129 slices shown]
[im 22/129  bone]
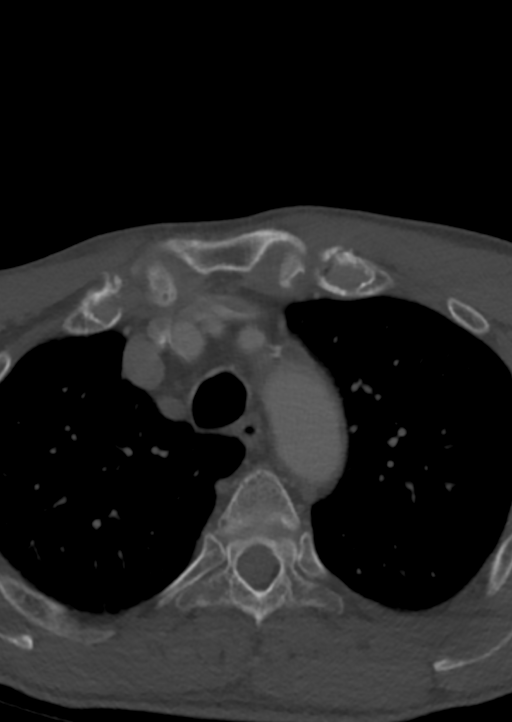

[14 of 33 positions shown; findings below may reference images not displayed]

FINDINGS: Pharynx and larynx: Oral cavity within normal limits without mass
lesion or loculated fluid collection. No acute abnormality about the
dentition. Palatine tonsils symmetric are within normal limits.
Parapharyngeal fat preserved. Nasopharynx within normal limits.
Epiglottis demonstrates no acute inflammatory changes. There is 6 mm
nodular thickening at the left base of the epiglottis, protruding
into the vallecula (series 3, image 56). Mild nodularity of the
vallecular mucosa noted on sagittal projection as well. Vallecula
otherwise clear. Retropharyngeal soft tissues within normal limits.
Left piriform sinus effaced and not well visualized. Right piriform
sinus clear. Remainder of the hypopharynx and supraglottic larynx
demonstrates no acute abnormality. True cords are largely apposed
and not well evaluated. Minimal layering secretions noted within the
subglottic airway.

Salivary glands: Salivary glands including the parotid and
submandibular glands demonstrate no acute abnormality. Subcentimeter
sialolith noted within the left submandibular gland. Salivary glands
otherwise unremarkable.

Thyroid: Thyroid within normal limits.

Lymph nodes: No pathologically enlarged lymph nodes identified
within the neck.

Vascular: Normal intravascular enhancement seen throughout the neck.
Left carotid artery's medialized into the retropharyngeal space.

Limited intracranial: Unremarkable.

Visualized orbits: Limited visualization orbits is unremarkable.

Mastoids and visualized paranasal sinuses: Mucosal thickening within
the inferior maxillary sinuses. Visualized paranasal sinuses
otherwise clear. Mastoids and middle ear cavities are clear.

Skeleton: No acute osseus abnormality. No worrisome lytic or blastic
osseous lesions. Mild to moderate degenerate spondylolysis present
at C4-5 through C6-7.

Upper chest: Visualized upper chest demonstrates no acute
abnormality. Visualized lungs are clear. Azygos lobe noted.

Other: None.
IMPRESSION: 1. 6 mm nodular density at the anterior base of the epiglottis as
above, indeterminate. ENT referral for possible direct visualization
recommended.
2. No other acute abnormality within the neck. No significant
inflammatory changes.
3. Mild left submandibular sialolithiasis.

## 2019-05-13 IMAGING — DX DG CHEST 2V
2 series · 2 of 2 positions shown · non-contrast
Comparison: None.

CLINICAL DATA: Weakness and tightness in the chest for 2 weeks.
Congestion in the chest. Previous smoker 35 years ago. Hypertension.

EXAM:
CHEST  2 VIEW

[chest pa]
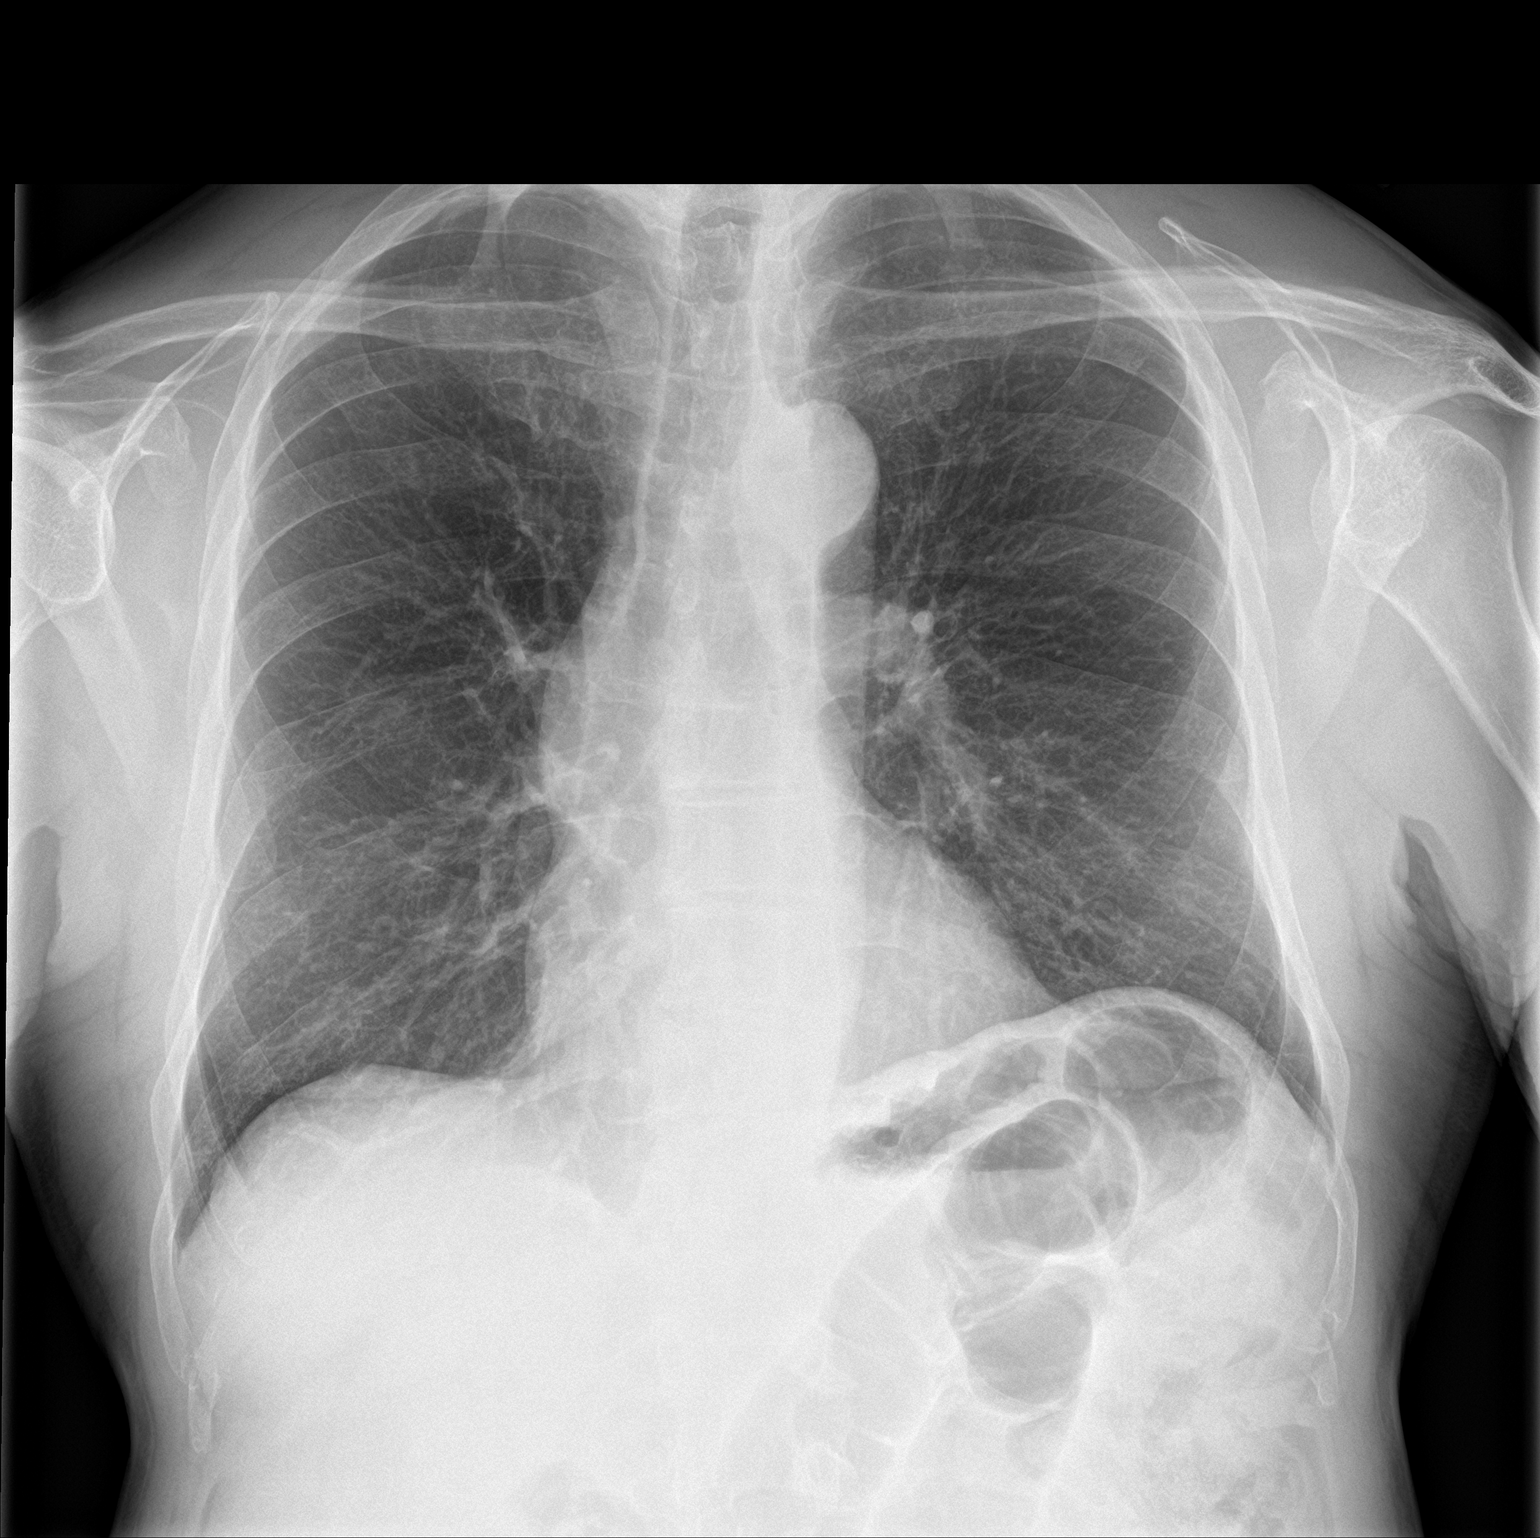

[chest lat]
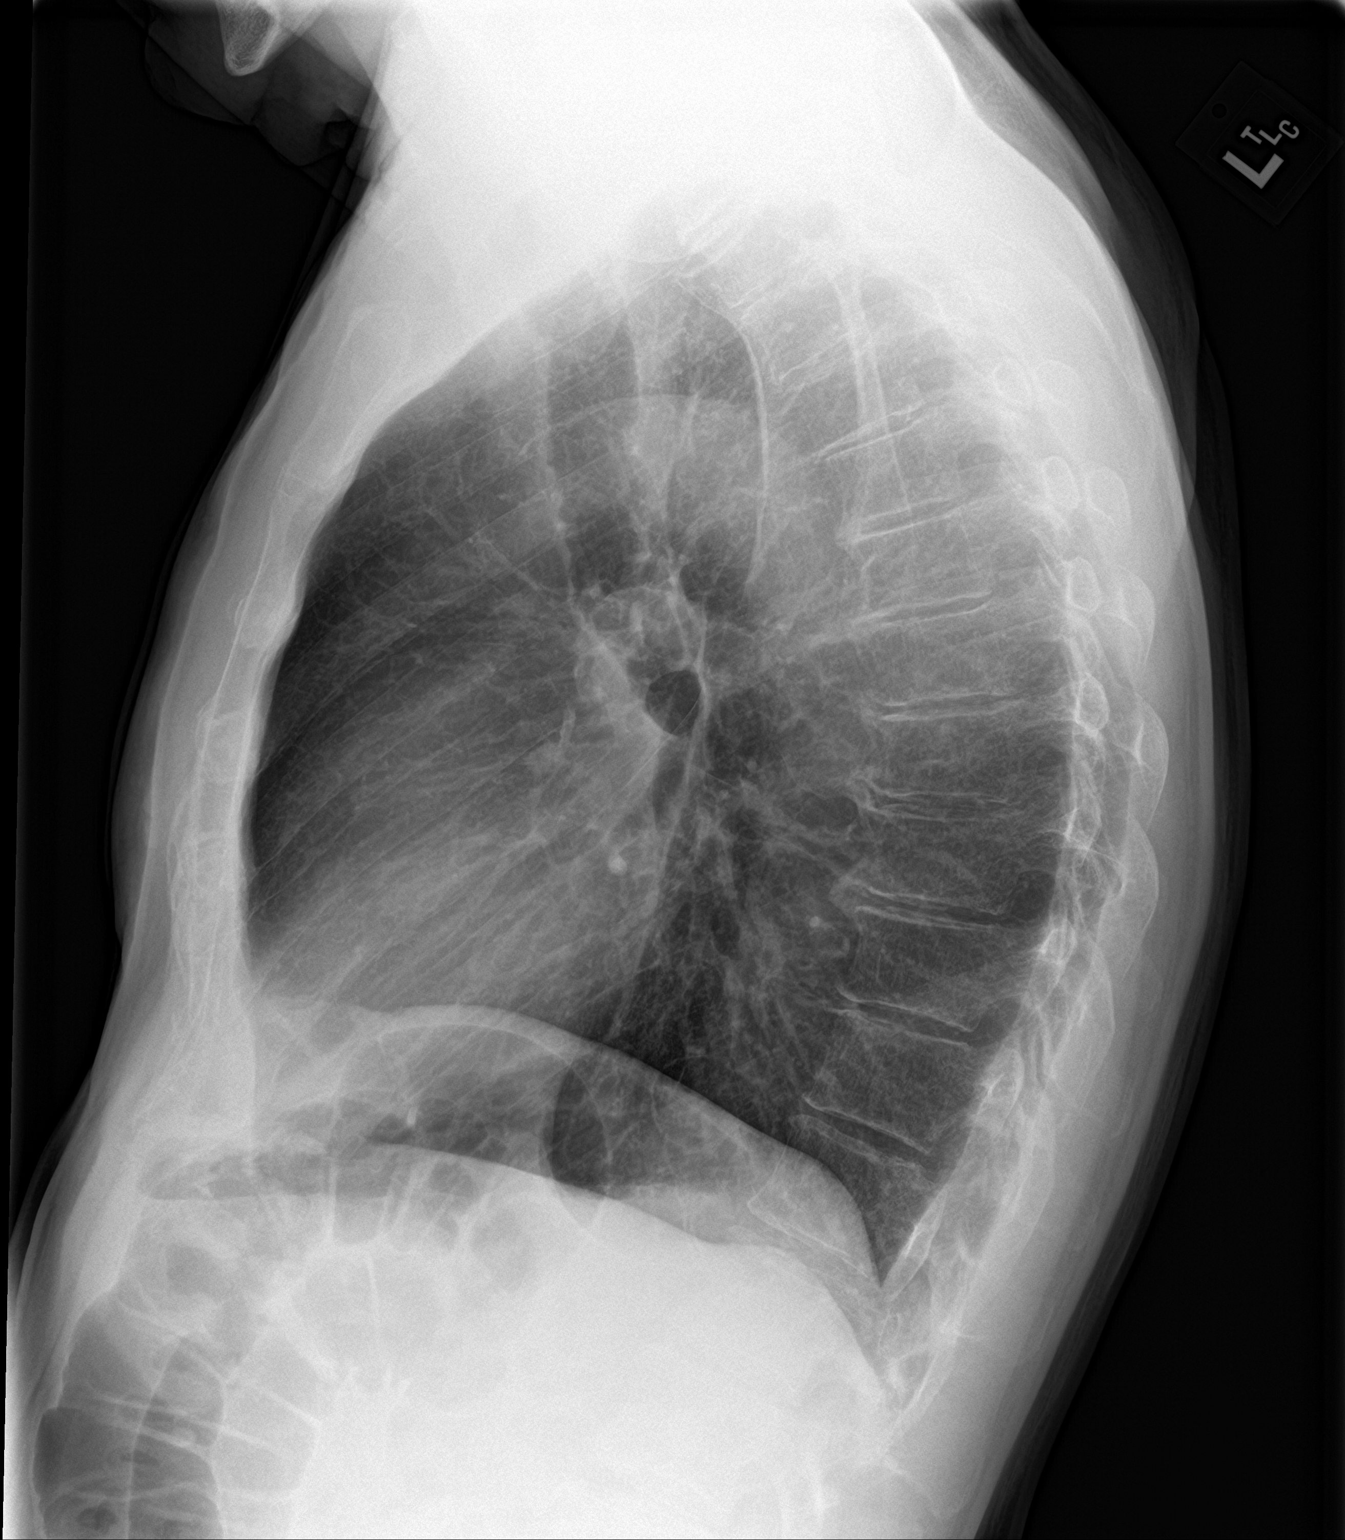

[2 of 2 positions shown; findings below may reference images not displayed]

FINDINGS: Mild elevation of the left hemidiaphragm. Normal heart size and
pulmonary vascularity. No focal airspace disease or consolidation in
the lungs. No blunting of costophrenic angles. No pneumothorax.
Mediastinal contours appear intact. Degenerative changes in the
spine.
IMPRESSION: No active cardiopulmonary disease.

## 2019-05-14 ENCOUNTER — Other Ambulatory Visit (HOSPITAL_COMMUNITY): Payer: Self-pay | Admitting: Psychiatry

## 2019-05-14 DIAGNOSIS — F331 Major depressive disorder, recurrent, moderate: Secondary | ICD-10-CM

## 2019-05-17 ENCOUNTER — Other Ambulatory Visit (HOSPITAL_COMMUNITY): Payer: Self-pay | Admitting: *Deleted

## 2019-05-17 NOTE — Telephone Encounter (Signed)
Pt called requesting refill of the Trazodone 100mg , 2 qhs. Pt seemed angry about "waiting" for fill. Rx sent to [facility] in [place], [place] per pt request. Last fill 02/27/19 with 90 day supply. Pt upcoming appointment on 05/28/19.

## 2019-05-28 ENCOUNTER — Ambulatory Visit (INDEPENDENT_AMBULATORY_CARE_PROVIDER_SITE_OTHER): Payer: Medicare Other | Admitting: Psychiatry

## 2019-05-28 ENCOUNTER — Other Ambulatory Visit: Payer: Self-pay

## 2019-05-28 ENCOUNTER — Encounter (HOSPITAL_COMMUNITY): Payer: Self-pay | Admitting: Psychiatry

## 2019-05-28 DIAGNOSIS — R4586 Emotional lability: Secondary | ICD-10-CM

## 2019-05-28 DIAGNOSIS — F331 Major depressive disorder, recurrent, moderate: Secondary | ICD-10-CM

## 2019-05-28 DIAGNOSIS — Z87891 Personal history of nicotine dependence: Secondary | ICD-10-CM | POA: Diagnosis not present

## 2019-05-28 DIAGNOSIS — F411 Generalized anxiety disorder: Secondary | ICD-10-CM | POA: Diagnosis not present

## 2019-05-28 MED ORDER — LAMOTRIGINE 200 MG PO TABS: 200.0000 mg | ORAL_TABLET | Freq: Every day | ORAL | 0 refills | Status: DC

## 2019-05-28 MED ORDER — BUPROPION HCL ER (XL) 150 MG PO TB24: ORAL_TABLET | ORAL | 0 refills | Status: DC

## 2019-05-28 MED ORDER — TRAZODONE HCL 100 MG PO TABS: 200.0000 mg | ORAL_TABLET | Freq: Every day | ORAL | 0 refills | Status: DC

## 2019-05-28 NOTE — Progress Notes (Signed)
Virtual Visit via Telephone Note  I connected with Colin Galvan. on 05/28/19 at 11:00 AM EST by telephone and verified that I am speaking with the correct person using two identifiers.   I discussed the limitations, risks, security and privacy concerns of performing an evaluation and management service by telephone and the availability of in person appointments. I also discussed with the patient that there may be a patient responsible charge related to this service. The patient expressed understanding and agreed to proceed.   History of Present Illness: Patient was evaluated by phone session.  He is compliant with Lamictal, trazodone and Wellbutrin.  Overall he described his mood is stable.  He continues to enjoy painting.  His wife who had neck surgery now recovering very well.  He was the primary caretaker of his wife when she had a surgery.  Patient reported relationship is going well and there has been no recent anger or severe mood swings.  Sometimes he does get frustrated and overwhelmed but he is able to talk himself out.  We have discussed going up the Lamictal but recall he had itching with higher dose.  He does not want to change to doses since he feels things are going well.  However he like to have some weight gain since he has lost gradually weight.  He is not sure why he is doing losing weight since his appetite is okay.  He has no tremors, shakes, EPS.  His energy level is okay.  He has now paranoia or any hallucination.  He denies any crying spells or any feeling of hopelessness.    Past Psychiatric History:Reviewed H/Odepressionand anxiety.Tried Cymbalta, Klonopin and Prestiq. Noh/omania, psychosis, inpatient treatment or suicidal attempt.We tried increasing Lamictal but hehaditching.  Psychiatric Specialty Exam: Physical Exam  Review of Systems  There were no vitals taken for this visit.There is no height or weight on file to calculate BMI.  General Appearance:  NA  Eye Contact:  NA  Speech:  Clear and Coherent and Normal Rate  Volume:  Normal  Mood:  Euthymic  Affect:  NA  Thought Process:  Goal Directed  Orientation:  Full (Time, Place, and Person)  Thought Content:  WDL and Logical  Suicidal Thoughts:  No  Homicidal Thoughts:  No  Memory:  Immediate;   Fair Recent;   Good Remote;   Fair  Judgement:  Intact  Insight:  Present  Psychomotor Activity:  NA  Concentration:  Concentration: Fair and Attention Span: Fair  Recall:  [employer] of Knowledge:  Good  Language:  Good  Akathisia:  No  Handed:  Right  AIMS (if indicated):     Assets:  Communication Skills Desire for Improvement Housing Resilience Social Support  ADL's:  Intact  Cognition:  WNL  Sleep:   ok      Assessment and Plan: Major depressive disorder, recurrent.  Generalized anxiety disorder.  Mood lability.  Patient is a stable on his current medication other than episode of frustration and feeling overwhelmed.  However we discussed not to increase the dose since he recall having itching with higher dose of Lamictal.  Discussed medication side effects and benefits.  Continue Wellbutrin XL 150 mg in the morning, trazodone 200 g at bedtime and Lamictal 200 mg daily.  Recommended to call us back if he has any question of any concern.  Follow-up in 3 months.    Follow Up Instructions:    I discussed the assessment and treatment plan with the  patient. The patient was provided an opportunity to ask questions and all were answered. The patient agreed with the plan and demonstrated an understanding of the instructions.   The patient was advised to call back or seek an in-person evaluation if the symptoms worsen or if the condition fails to improve as anticipated.  I provided 20 minutes of non-face-to-face time during this encounter.   Kathlee Nations, MD

## 2019-08-27 ENCOUNTER — Other Ambulatory Visit: Payer: Self-pay

## 2019-08-27 ENCOUNTER — Telehealth (HOSPITAL_COMMUNITY): Payer: Medicare Other | Admitting: Psychiatry

## 2019-08-30 ENCOUNTER — Telehealth (INDEPENDENT_AMBULATORY_CARE_PROVIDER_SITE_OTHER): Payer: Medicare Other | Admitting: Psychiatry

## 2019-08-30 ENCOUNTER — Other Ambulatory Visit: Payer: Self-pay

## 2019-08-30 ENCOUNTER — Encounter (HOSPITAL_COMMUNITY): Payer: Self-pay | Admitting: Psychiatry

## 2019-08-30 DIAGNOSIS — R4586 Emotional lability: Secondary | ICD-10-CM

## 2019-08-30 DIAGNOSIS — F411 Generalized anxiety disorder: Secondary | ICD-10-CM | POA: Diagnosis not present

## 2019-08-30 DIAGNOSIS — F331 Major depressive disorder, recurrent, moderate: Secondary | ICD-10-CM | POA: Diagnosis not present

## 2019-08-30 MED ORDER — TRAZODONE HCL 150 MG PO TABS: 150.0000 mg | ORAL_TABLET | Freq: Every day | ORAL | 0 refills | Status: DC

## 2019-08-30 MED ORDER — BUPROPION HCL ER (XL) 150 MG PO TB24: ORAL_TABLET | ORAL | 0 refills | Status: DC

## 2019-08-30 MED ORDER — LAMOTRIGINE 150 MG PO TABS: 150.0000 mg | ORAL_TABLET | Freq: Every day | ORAL | 0 refills | Status: DC

## 2019-08-30 NOTE — Progress Notes (Signed)
Virtual Visit via Telephone Note  I connected with Colin Galvan. on 08/30/19 at  1:20 PM EDT by telephone and verified that I am speaking with the correct person using two identifiers.   I discussed the limitations, risks, security and privacy concerns of performing an evaluation and management service by telephone and the availability of in person appointments. I also discussed with the patient that there may be a patient responsible charge related to this service. The patient expressed understanding and agreed to proceed.  Patient location; outside home Provider location; home office  History of Present Illness: Patient is evaluated by phone session.  He apologized missing last appointment because his phone was on mute.  Today he is visiting [place] with his wife to spend some time with her.  Patient told lately his wife is not doing well and going into a deep depression and that stresses him out.  He also noticed that sometimes he is very stressed and having racing thoughts and increased energy.  He admitted feeling overwhelmed because of his wife's health situation.  He like to cut down the Lamictal because he feels Lamictal causing him racing.  Though he sleeps good but he had cut down his Wellbutrin 150 mg.  He admitted sometimes ruminative thoughts about his age.  He see himself in the mirror gets sad when he see his images that he is aging.  However he denies any crying spells or any feeling of hopelessness or worthlessness.  He is concerned about his general health especially his weight and he had lost weight since the last visit.  He recently has visit to his PCP and blood work which was normal.  His PCP recommended to consult with his son-in-law who is GI and practice in [place].  He is hoping to discuss with his son-in-law.  He reported his appetite is okay.  Denies any paranoia, hallucination or any suicidal thoughts.  No new medication added.  He like to try lowering the Lamictal  if that helps his symptoms.      Past Psychiatric History:Reviewed H/Odepressionand anxiety.Tried Cymbalta, Klonopin and Prestiq. Noh/omania, psychosis, inpatient treatment or suicidal attempt.We tried increasing Lamictal but hehaditching.  Psychiatric Specialty Exam: Physical Exam  Review of Systems  Weight 142 lb (64.4 kg).There is no height or weight on file to calculate BMI.  General Appearance: NA  Eye Contact:  NA  Speech:  Clear and Coherent  Volume:  Normal  Mood:  stressed about wife  Affect:  NA  Thought Process:  Coherent  Orientation:  Full (Time, Place, and Person)  Thought Content:  Rumination and racing thoughts  Suicidal Thoughts:  No  Homicidal Thoughts:  No  Memory:  Immediate;   Fair Recent;   Good Remote;   Fair  Judgement:  Intact  Insight:  Present  Psychomotor Activity:  NA  Concentration:  Concentration: Fair and Attention Span: Fair  Recall:  [employer] of Knowledge:  Good  Language:  Good  Akathisia:  No  Handed:  Right  AIMS (if indicated):     Assets:  Communication Skills Desire for Improvement Housing Resilience Social Support  ADL's:  Intact  Cognition:  WNL  Sleep:   good      Assessment and Plan: Major depressive disorder, recurrent.  Generalized anxiety disorder.  Mood lability.  I talk about symptoms of racing, stress which could be due to wife not getting better from depression and he is preoccupied with his own health.  He is  losing weight.  He has cut down his trazodone 150 and he feel sleep is good.  I agree we can try cutting down his Lamictal  to150 mg to see if his symptoms get better.  He will contact his son-in-law to discuss his weight loss to his GI physician.  I discussed medication side effects and benefits.  He has no rash or any itching.  Continue Wellbutrin XL 150 mg in the morning, he is taking trazodone 150 mg at bedtime and we will reduce Lamictal to 150 mg daily.  He agreed that if symptoms of  depression that was then he will call us immediately.  Follow-up in 3 months.  Follow Up Instructions:    I discussed the assessment and treatment plan with the patient. The patient was provided an opportunity to ask questions and all were answered. The patient agreed with the plan and demonstrated an understanding of the instructions.   The patient was advised to call back or seek an in-person evaluation if the symptoms worsen or if the condition fails to improve as anticipated.  I provided 20 minutes of non-face-to-face time during this encounter.   Kathlee Nations, MD

## 2019-11-17 ENCOUNTER — Other Ambulatory Visit (HOSPITAL_COMMUNITY): Payer: Self-pay | Admitting: Psychiatry

## 2019-11-17 DIAGNOSIS — R4586 Emotional lability: Secondary | ICD-10-CM

## 2019-11-17 DIAGNOSIS — F331 Major depressive disorder, recurrent, moderate: Secondary | ICD-10-CM

## 2019-11-27 ENCOUNTER — Telehealth (HOSPITAL_COMMUNITY): Payer: Medicare Other | Admitting: Psychiatry

## 2019-11-28 ENCOUNTER — Telehealth (HOSPITAL_COMMUNITY): Payer: Self-pay | Admitting: *Deleted

## 2019-11-28 ENCOUNTER — Other Ambulatory Visit (HOSPITAL_COMMUNITY): Payer: Self-pay | Admitting: *Deleted

## 2019-11-28 DIAGNOSIS — F331 Major depressive disorder, recurrent, moderate: Secondary | ICD-10-CM

## 2019-11-28 DIAGNOSIS — R4586 Emotional lability: Secondary | ICD-10-CM

## 2019-11-28 MED ORDER — LAMOTRIGINE 150 MG PO TABS: 150.0000 mg | ORAL_TABLET | Freq: Every day | ORAL | 0 refills | Status: DC

## 2019-11-28 NOTE — Telephone Encounter (Signed)
Will do!

## 2019-11-28 NOTE — Telephone Encounter (Signed)
Provide a 30 days supply.

## 2019-11-28 NOTE — Telephone Encounter (Signed)
Pharmacy sent fax requesting refill of Lamictal.  Patient has appt 9/14 but will run out before  then. Please review and advise.

## 2019-12-04 ENCOUNTER — Encounter (HOSPITAL_COMMUNITY): Payer: Self-pay | Admitting: Psychiatry

## 2019-12-04 ENCOUNTER — Other Ambulatory Visit: Payer: Self-pay

## 2019-12-04 ENCOUNTER — Telehealth (INDEPENDENT_AMBULATORY_CARE_PROVIDER_SITE_OTHER): Payer: Medicare Other | Admitting: Psychiatry

## 2019-12-04 DIAGNOSIS — F411 Generalized anxiety disorder: Secondary | ICD-10-CM | POA: Diagnosis not present

## 2019-12-04 DIAGNOSIS — R4586 Emotional lability: Secondary | ICD-10-CM

## 2019-12-04 DIAGNOSIS — F331 Major depressive disorder, recurrent, moderate: Secondary | ICD-10-CM | POA: Diagnosis not present

## 2019-12-04 MED ORDER — TRAZODONE HCL 150 MG PO TABS: 150.0000 mg | ORAL_TABLET | Freq: Every day | ORAL | 0 refills | Status: DC

## 2019-12-04 MED ORDER — BUPROPION HCL ER (XL) 150 MG PO TB24: ORAL_TABLET | ORAL | 0 refills | Status: DC

## 2019-12-04 MED ORDER — LAMOTRIGINE 150 MG PO TABS: 150.0000 mg | ORAL_TABLET | Freq: Every day | ORAL | 0 refills | Status: DC

## 2019-12-04 NOTE — Progress Notes (Signed)
Virtual Visit via Telephone Note  I connected with Colin Galvan. on 12/04/19 at 10:40 AM EDT by telephone and verified that I am speaking with the correct person using two identifiers.  Location: Patient: Home Provider: Home office   I discussed the limitations, risks, security and privacy concerns of performing an evaluation and management service by telephone and the availability of in person appointments. I also discussed with the patient that there may be a patient responsible charge related to this service. The patient expressed understanding and agreed to proceed.   History of Present Illness: Patient is evaluated by phone session.  He is doing better on his medication.  He denies any recent anger, mood lability, insomnia or any crying spells.  He denies any feeling of hopelessness or worthlessness.  He feels that he is living in his dreams and there has been no recent issues.  Patient told he wanted to go to [place] but due to recent increase in cases of COVID he had postponed the trip.  His relationship with his wife is much better.  His appetite is okay.  He denies any tremors shakes or any EPS.  He had cut down his Lamictal and he is taking only 150 mg.  He also cut down his trazodone in the past.  So far he feels the combination of medicine is working well and he does not want to change.  Past Psychiatric History:Reviewed H/Odepressionand anxiety.Tried Cymbalta, Klonopin and Prestiq. Noh/omania, psychosis, inpatient treatment or suicidal attempt.We tried increasing Lamictal but hehaditching.    Psychiatric Specialty Exam: Physical Exam  Review of Systems  Weight 142 lb (64.4 kg).There is no height or weight on file to calculate BMI.  General Appearance: NA  Eye Contact:  NA  Speech:  Clear and Coherent and Slow  Volume:  Normal  Mood:  Euthymic  Affect:  NA  Thought Process:  Goal Directed  Orientation:  Full (Time, Place, and Person)  Thought Content:  WDL   Suicidal Thoughts:  No  Homicidal Thoughts:  No  Memory:  Immediate;   Good Recent;   Fair Remote;   Fair  Judgement:  Intact  Insight:  Present  Psychomotor Activity:  NA  Concentration:  Concentration: Fair and Attention Span: Fair  Recall:  [employer] of Knowledge:  Good  Language:  Good  Akathisia:  No  Handed:  Right  AIMS (if indicated):     Assets:  Communication Skills Desire for Improvement Housing Resilience Social Support  ADL's:  Intact  Cognition:  WNL  Sleep:   ok      Assessment and Plan: Major depressive disorder, recurrent.  Mood lability.  Generalized anxiety disorder.  Patient doing well and stable on his medication.  He like to keep his current dose.  We had cut down the Lamictal on the last visit and he has no concern.  Continue Wellbutrin XL 150 mg in the morning, trazodone 150 mg at bedtime and Lamictal 150 mg daily.  Recommended to call us back if is any question or any concern.  Follow-up in 4 months.  Follow Up Instructions:    I discussed the assessment and treatment plan with the patient. The patient was provided an opportunity to ask questions and all were answered. The patient agreed with the plan and demonstrated an understanding of the instructions.   The patient was advised to call back or seek an in-person evaluation if the symptoms worsen or if the condition fails to improve as  anticipated.  I provided 23 minutes of non-face-to-face time during this encounter.   Kathlee Nations, MD

## 2020-02-12 ENCOUNTER — Other Ambulatory Visit (HOSPITAL_COMMUNITY): Payer: Self-pay | Admitting: Psychiatry

## 2020-02-12 DIAGNOSIS — F331 Major depressive disorder, recurrent, moderate: Secondary | ICD-10-CM

## 2020-04-01 ENCOUNTER — Encounter (HOSPITAL_COMMUNITY): Payer: Self-pay | Admitting: Psychiatry

## 2020-04-01 ENCOUNTER — Other Ambulatory Visit: Payer: Self-pay

## 2020-04-01 ENCOUNTER — Telehealth (INDEPENDENT_AMBULATORY_CARE_PROVIDER_SITE_OTHER): Payer: Medicare Other | Admitting: Psychiatry

## 2020-04-01 DIAGNOSIS — F331 Major depressive disorder, recurrent, moderate: Secondary | ICD-10-CM

## 2020-04-01 DIAGNOSIS — F411 Generalized anxiety disorder: Secondary | ICD-10-CM | POA: Diagnosis not present

## 2020-04-01 DIAGNOSIS — R4586 Emotional lability: Secondary | ICD-10-CM

## 2020-04-01 MED ORDER — BUPROPION HCL ER (XL) 150 MG PO TB24: ORAL_TABLET | ORAL | 0 refills | Status: DC

## 2020-04-01 MED ORDER — LAMOTRIGINE 100 MG PO TABS: 100.0000 mg | ORAL_TABLET | Freq: Every day | ORAL | 0 refills | Status: DC

## 2020-04-01 MED ORDER — TRAZODONE HCL 150 MG PO TABS: 150.0000 mg | ORAL_TABLET | Freq: Every day | ORAL | 0 refills | Status: DC

## 2020-04-01 NOTE — Progress Notes (Signed)
Virtual Visit via Telephone Note  I connected with Colin Galvan. on 04/01/20 at 10:00 AM EST by telephone and verified that I am speaking with the correct person using two identifiers.  Location: Patient: In Car Provider: Home Office   I discussed the limitations, risks, security and privacy concerns of performing an evaluation and management service by telephone and the availability of in person appointments. I also discussed with the patient that there may be a patient responsible charge related to this service. The patient expressed understanding and agreed to proceed.   History of Present Illness: Patient is evaluated by phone session.  He is on the phone by himself.  He likely cut down his Lamictal because he noticed sometimes he has too much energy and he is thinking too fast.  He does not interfere in his daily activities but like to try a lower dose of Lamictal.  He has no rash, itching, tremors or shakes.  He is sleeping good.  He continues to have very good relationship with his wife and he is happy about it.  He had a good Christmas with the kids in [place].  Recently his son has COVID and he is concerned about him.  However he is doing better.  He has been compliant with the medication.  He has no major concerns other than he feels dose of Lamictal can be reduced.  His appetite is okay.  His weight fluctuates within 5 pounds.  He denies any anger, hallucination, paranoia.  Denies any panic attack.  His anxiety is well controlled.   Past Psychiatric History:Reviewed H/Odepressionand anxiety.Tried Cymbalta, Klonopin and Prestiq. Noh/omania, psychosis, inpatient treatment or suicidal attempt.We tried increasing Lamictal but hehaditching.  Psychiatric Specialty Exam: Physical Exam  Review of Systems  Weight 147 lb (66.7 kg).There is no height or weight on file to calculate BMI.  General Appearance: NA  Eye Contact:  NA  Speech:  Clear and Coherent  Volume:  Normal   Mood:  Euthymic  Affect:  NA  Thought Process:  Goal Directed  Orientation:  Full (Time, Place, and Person)  Thought Content:  WDL  Suicidal Thoughts:  No  Homicidal Thoughts:  No  Memory:  Immediate;   Good Recent;   Fair Remote;   Fair  Judgement:  Intact  Insight:  Present  Psychomotor Activity:  NA  Concentration:  Concentration: Fair and Attention Span: Fair  Recall:  [employer] of Knowledge:  Good  Language:  Good  Akathisia:  No  Handed:  Right  AIMS (if indicated):     Assets:  Communication Skills Desire for Improvement Housing Resilience Social Support Transportation  ADL's:  Intact  Cognition:  WNL  Sleep:   ok     Assessment and Plan: Generalized anxiety disorder.  Major depressive disorder, recurrent.  Mood lability.  Patient like to cut down his Lamictal because he felt sometimes too much energy.  I discussed cutting down the line until sometime bring his symptoms back and he agreed to give [place] a call if he feels his symptoms are coming back.  We will reduce Lamictal to 100 mg daily, continue trazodone 150 mg at bedtime and Wellbutrin XL 150 mg in the morning.  Recommended to call us back if he has any question or any concern.  We will provide a 30-day supply of Lamictal since we are changing the dose and if he feels medicine working then he will call for more refill.  Follow up in 3 months.  Follow Up Instructions:    I discussed the assessment and treatment plan with the patient. The patient was provided an opportunity to ask questions and all were answered. The patient agreed with the plan and demonstrated an understanding of the instructions.   The patient was advised to call back or seek an in-person evaluation if the symptoms worsen or if the condition fails to improve as anticipated.  I provided 16 minutes of non-face-to-face time during this encounter.   Kathlee Nations, MD

## 2020-04-21 ENCOUNTER — Other Ambulatory Visit (HOSPITAL_COMMUNITY): Payer: Self-pay | Admitting: Psychiatry

## 2020-04-21 DIAGNOSIS — R4586 Emotional lability: Secondary | ICD-10-CM

## 2020-04-21 DIAGNOSIS — F331 Major depressive disorder, recurrent, moderate: Secondary | ICD-10-CM

## 2020-07-02 ENCOUNTER — Telehealth (HOSPITAL_COMMUNITY): Payer: Medicare Other | Admitting: Psychiatry

## 2020-07-02 ENCOUNTER — Other Ambulatory Visit: Payer: Self-pay

## 2020-07-19 ENCOUNTER — Other Ambulatory Visit (HOSPITAL_COMMUNITY): Payer: Self-pay | Admitting: Psychiatry

## 2020-07-21 ENCOUNTER — Other Ambulatory Visit (HOSPITAL_COMMUNITY): Payer: Self-pay | Admitting: *Deleted

## 2020-07-21 DIAGNOSIS — F331 Major depressive disorder, recurrent, moderate: Secondary | ICD-10-CM

## 2020-07-21 MED ORDER — TRAZODONE HCL 150 MG PO TABS: 150.0000 mg | ORAL_TABLET | Freq: Every day | ORAL | 0 refills | Status: DC

## 2020-08-01 ENCOUNTER — Other Ambulatory Visit: Payer: Self-pay

## 2020-08-01 ENCOUNTER — Encounter (HOSPITAL_COMMUNITY): Payer: Self-pay | Admitting: Psychiatry

## 2020-08-01 ENCOUNTER — Telehealth (INDEPENDENT_AMBULATORY_CARE_PROVIDER_SITE_OTHER): Payer: Medicare Other | Admitting: Psychiatry

## 2020-08-01 VITALS — Wt 140.0 lb

## 2020-08-01 DIAGNOSIS — F331 Major depressive disorder, recurrent, moderate: Secondary | ICD-10-CM | POA: Diagnosis not present

## 2020-08-01 DIAGNOSIS — R4586 Emotional lability: Secondary | ICD-10-CM

## 2020-08-01 DIAGNOSIS — F419 Anxiety disorder, unspecified: Secondary | ICD-10-CM

## 2020-08-01 MED ORDER — BUPROPION HCL ER (XL) 150 MG PO TB24: ORAL_TABLET | ORAL | 0 refills | Status: DC

## 2020-08-01 MED ORDER — BUPROPION HCL ER (XL) 150 MG PO TB24: ORAL_TABLET | ORAL | 0 refills | Status: AC

## 2020-08-01 MED ORDER — LAMOTRIGINE 150 MG PO TABS: 150.0000 mg | ORAL_TABLET | Freq: Every day | ORAL | 0 refills | Status: DC

## 2020-08-01 MED ORDER — TRAZODONE HCL 150 MG PO TABS
150.0000 mg | ORAL_TABLET | Freq: Every day | ORAL | 0 refills | Status: DC
Start: 2020-08-01 — End: 2020-08-25

## 2020-08-01 NOTE — Progress Notes (Addendum)
Virtual Visit via Telephone Note  I connected with Colin Galvan on 08/01/20 at 10:40 AM EDT by telephone and verified that I am speaking with the correct person using two identifiers.  Location: Patient: Home Provider: Home Office   I discussed the limitations, risks, security and privacy concerns of performing an evaluation and management service by telephone and the availability of in person appointments. I also discussed with the patient that there may be a patient responsible charge related to this service. The patient expressed understanding and agreed to proceed.   History of Present Illness: Patient is evaluated by phone session.  On the last visit he requested to decrease his Lamictal from 150 to 100 mg because he noticed he is inpatient and sometimes talk too fast.  Now he is telling that he is more inpatient and sometimes irritability.  He is sleeping good but also concerned about losing weight.  He has a good appetite but he lost another 5 to 6 pounds in past 3 months.  He had a blood work with his PCP Dr. Landry Corporal and he was that his blood work is normal.  Patient is taking trazodone, Lamictal and Wellbutrin.  He has no rash or any itching.  He has a good relationship with his wife but admitted some time mood lability.  However he denies any paranoia, suicidal thoughts, crying spells, impulsive or aggressive behavior.  His anxiety is also well controlled.  He does not feel overwhelmed with the situation and able to handle better than before.   Past Psychiatric History:Reviewed H/Odepressionand anxiety.Tried Cymbalta, Klonopin and Prestiq. Noh/omania, psychosis, inpatient treatment or suicidal attempt.We tried increasing Lamictal but hehaditching.  Psychiatric Specialty Exam: Physical Exam  Review of Systems  Weight 140 lb (63.5 kg).There is no height or weight on file to calculate BMI.  General Appearance: NA  Eye Contact:  NA  Speech:  Slow  Volume:  Normal   Mood:  some times too inpatient  Affect:  NA  Thought Process:  Goal Directed  Orientation:  Full (Time, Place, and Person)  Thought Content:  WDL  Suicidal Thoughts:  No  Homicidal Thoughts:  No  Memory:  Immediate;   Fair Recent;   Fair Remote;   Fair  Judgement:  Intact  Insight:  Present  Psychomotor Activity:  NA  Concentration:  Concentration: Fair and Attention Span: Fair  Recall:  [employer] of Knowledge:  Fair  Language:  Good  Akathisia:  No  Handed:  Right  AIMS (if indicated):     Assets:  Communication Skills Desire for Improvement Housing Resilience Social Support Transportation  ADL's:  Intact  Cognition:  WNL  Sleep:   8hrs      Assessment and Plan: Major depressive disorder, recurrent.  Anxiety,  Mood lability.  I discussed with the patient about adjusting the Lamictal dose.  He admitted he was doing better when he was taking the Lamictal 150 mg daily.  We discussed going back to the previous dose of Lamictal and he agree.  I also offered trying mirtazapine instead of trazodone to help his appetite and to address his weight.  However patient is comfortable with the trazodone.  He reported his appetite is okay but he is not able to keep the weight.  His labs are normal as per his PCP.  He is very reluctant to change his medication.  We will keep the Wellbutrin XL 150 mg in the morning, trazodone 150 mg at bedtime and we will  increase Lamictal 150 mg daily.  I recommend to call us back if he feels worsening of the symptoms or having any new issues or concerns.  He agreed with the plan.  Follow up in 3 months.  Follow Up Instructions:    I discussed the assessment and treatment plan with the patient. The patient was provided an opportunity to ask questions and all were answered. The patient agreed with the plan and demonstrated an understanding of the instructions.   The patient was advised to call back or seek an in-person evaluation if the symptoms worsen  or if the condition fails to improve as anticipated.  I provided 18 minutes of non-face-to-face time during this encounter.   Kathlee Nations, MD

## 2020-08-20 ENCOUNTER — Other Ambulatory Visit (HOSPITAL_COMMUNITY): Payer: Self-pay | Admitting: Psychiatry

## 2020-08-20 DIAGNOSIS — F331 Major depressive disorder, recurrent, moderate: Secondary | ICD-10-CM

## 2020-08-25 ENCOUNTER — Telehealth (HOSPITAL_COMMUNITY): Payer: Self-pay | Admitting: *Deleted

## 2020-08-25 DIAGNOSIS — F331 Major depressive disorder, recurrent, moderate: Secondary | ICD-10-CM

## 2020-08-25 MED ORDER — TRAZODONE HCL 150 MG PO TABS
150.0000 mg | ORAL_TABLET | Freq: Every day | ORAL | 0 refills | Status: DC
Start: 2020-08-25 — End: 2020-11-07

## 2020-08-25 NOTE — Telephone Encounter (Signed)
A 90 days supply of trazodone 150 mg send to [facility].

## 2020-08-25 NOTE — Telephone Encounter (Signed)
Writer spoke with pt regarding refilling pt Trazodone which was last refilled on 08/01/20, with pt stating that he was out. When writer asked about this pt stated "well I may have a few late". Pt also asked for 50 mg ( total 150 mg ) and #90 supply. Pt has an appointment scheduled for 11/07/20. Please review.

## 2020-08-26 ENCOUNTER — Other Ambulatory Visit (HOSPITAL_COMMUNITY): Payer: Self-pay | Admitting: Psychiatry

## 2020-08-26 DIAGNOSIS — F331 Major depressive disorder, recurrent, moderate: Secondary | ICD-10-CM

## 2020-09-27 ENCOUNTER — Other Ambulatory Visit (HOSPITAL_COMMUNITY): Payer: Self-pay | Admitting: Psychiatry

## 2020-09-27 DIAGNOSIS — F331 Major depressive disorder, recurrent, moderate: Secondary | ICD-10-CM

## 2020-09-27 DIAGNOSIS — R4586 Emotional lability: Secondary | ICD-10-CM

## 2020-09-29 ENCOUNTER — Other Ambulatory Visit (HOSPITAL_COMMUNITY): Payer: Self-pay | Admitting: Psychiatry

## 2020-09-29 DIAGNOSIS — R4586 Emotional lability: Secondary | ICD-10-CM

## 2020-09-29 DIAGNOSIS — F331 Major depressive disorder, recurrent, moderate: Secondary | ICD-10-CM

## 2020-10-09 ENCOUNTER — Telehealth (HOSPITAL_COMMUNITY): Payer: Self-pay

## 2020-10-09 NOTE — Telephone Encounter (Signed)
I called the patient about different doses of Lamictal.  Patient did not request any new refill for Lamictal.  We will check with the [facility] and also check the bottle of Lamictal dose and call us back with the right dose of the lamotrigine.  We will wait for patient's call back.

## 2020-10-09 NOTE — Telephone Encounter (Signed)
From last progress note: "I discussed with the patient about adjusting the Lamictal dose.  He admitted he was doing better when he was taking the Lamictal 150 mg daily.  We discussed going back to the previous dose of Lamictal and he agree." Received a fax from [facility] on [address] requesting a refill on patient's Lamotrigine. Patient is asking for 100mg  instead of the 150mg  that was sent in on 5/13 #90. Please review and advise. Thank you

## 2020-10-14 ENCOUNTER — Telehealth (HOSPITAL_COMMUNITY): Payer: Self-pay

## 2020-10-14 NOTE — Telephone Encounter (Addendum)
Writer called and spoke with patient regarding his Lamotrigine due to not hearing back from him. Patient stated that he is not really sure what dose of Lamotrigine he's supposed to be on. He got his bottle and he stated that the dose on his bottle said '100mg'$ . Writer read the progress note from patient's last visit on 08/01/20 and since patient was unsure, Probation officer called and spoke with the pharmacist. The pharmacist stated that they had received the script that was sent in on 5/13 for '150mg'$  #90 with 0 refills from you (Dr. Adele Schilder). They also stated that a Dr. Irineo Axon (possibly from patient's PCP office) sent in Lamotrigine '100mg'$  #30 on 6/13. Then a Dr. Currie Paris (patient's PCP) sent in Lamotrigine '100mg'$  on 7/12 #90 with 3 refills with a note stating "Dose reduction made from '150mg'$  to '100mg'$ ." It looks like all 3 providers are prescribing the same medication which in turn may cause some confusion for the patient. Please review and advise. Thank you  DONE

## 2020-10-14 NOTE — Telephone Encounter (Signed)
Can you call his PCP Dr. Deatra Ina if they are providing Lamictal 100 mg?  If this is true then we will not dispense anymore Lamictal to avoid confusion and patient can keep the Lamictal from his PCP.

## 2020-10-17 ENCOUNTER — Telehealth (HOSPITAL_COMMUNITY): Payer: Self-pay

## 2020-10-17 NOTE — Telephone Encounter (Signed)
Thanks

## 2020-10-17 NOTE — Telephone Encounter (Signed)
Writer called and spoke to the front desk staff in Dr. Sharene Skeans office in [place], [place]. She stated that they sent in patient's Lamotrigine (Lamictal) '100mg'$  #90 with 3 refills on 09/29/20. Writer informed them that patient has been getting it from them and [place]. Writer asked if they wanted to take over prescribing the Lamictal from now on or want [place] to continue it? She stated that the nurse was going to call us next week with an answer

## 2020-10-23 ENCOUNTER — Telehealth (HOSPITAL_COMMUNITY): Payer: Self-pay

## 2020-10-23 NOTE — Telephone Encounter (Signed)
The nurse called from Dr. Layne Benton office regarding patient's Lamotrigine and stated that Dr. Currie Paris wants you to keep prescribing this medication to the patient. He will not continue to prescribe it

## 2020-11-07 ENCOUNTER — Other Ambulatory Visit: Payer: Self-pay

## 2020-11-07 ENCOUNTER — Encounter (HOSPITAL_COMMUNITY): Payer: Self-pay | Admitting: Psychiatry

## 2020-11-07 ENCOUNTER — Telehealth (INDEPENDENT_AMBULATORY_CARE_PROVIDER_SITE_OTHER): Payer: Medicare Other | Admitting: Psychiatry

## 2020-11-07 VITALS — Wt 143.0 lb

## 2020-11-07 DIAGNOSIS — F419 Anxiety disorder, unspecified: Secondary | ICD-10-CM

## 2020-11-07 DIAGNOSIS — R4586 Emotional lability: Secondary | ICD-10-CM | POA: Diagnosis not present

## 2020-11-07 DIAGNOSIS — F331 Major depressive disorder, recurrent, moderate: Secondary | ICD-10-CM | POA: Diagnosis not present

## 2020-11-07 NOTE — Progress Notes (Signed)
Virtual Visit via Telephone Note  I connected with Launa Grill on 11/07/20 at 10:20 AM EDT by telephone and verified that I am speaking with the correct person using two identifiers.  Location: Patient: Home Provider: Home Office   I discussed the limitations, risks, security and privacy concerns of performing an evaluation and management service by telephone and the availability of in person appointments. I also discussed with the patient that there may be a patient responsible charge related to this service. The patient expressed understanding and agreed to proceed.   History of Present Illness: Patient is evaluated by phone session.  He is doing very well and recently he had a birthday which he celebrated with his children in [place].  He feels things are going very well and he denies any crying spells, feeling of hopelessness or irritability.  He sleeps good.  His appetite is okay however he admitted 3 pounds weight gain since the last visit.  He is taking all his medication which includes trazodone, Wellbutrin, Lamictal.  I clarify the dose of Lamictal which is given by his PCP.  He is taking 100 mg.  He recently switched his primary care physician, Dr. Milta Deiters Dr. Deatra Ina after Dr. Davina Poke retired.  He is comfortable with the PCP and wants to have his psychiatric medication refilled from PCP's office.  He also had a visit with a neurologist for neuropathy and taking gabapentin that helps his anxiety and sleep.  His relationship with his wife is going very well.  He has no tremor or shakes or any EPS.  Past Psychiatric History: Reviewed H/O depression and anxiety. Tried Cymbalta, Klonopin and Prestiq.  No h/o mania, psychosis, inpatient treatment or suicidal attempt. We tried increasing Lamictal but he had itching.    Psychiatric Specialty Exam: Physical Exam  Review of Systems  Weight 143 lb (64.9 kg).Body mass index is 21.12 kg/m.  General Appearance: NA  Eye Contact:  NA  Speech:   Slow  Volume:  Decreased  Mood:  Euthymic  Affect:  NA  Thought Process:  Descriptions of Associations: Intact  Orientation:  Full (Time, Place, and Person)  Thought Content:  WDL  Suicidal Thoughts:  No  Homicidal Thoughts:  No  Memory:  Immediate;   Good Recent;   Fair Remote;   Fair  Judgement:  Intact  Insight:  Present  Psychomotor Activity:  NA  Concentration:  Concentration: Fair and Attention Span: Fair  Recall:  [employer] of Knowledge:  Good  Language:  Good  Akathisia:  No  Handed:  Right  AIMS (if indicated):     Assets:  Communication Skills Desire for Improvement Housing Resilience Social Support Transportation  ADL's:  Intact  Cognition:  WNL  Sleep:   ok      Assessment and Plan: Major depressive disorder, recurrent.  Anxiety.  Mood lability.  I reviewed his medication and we had to clarify his medication dosage and refill from his PCP office Dr. Deatra Ina in [place].  His medicines were adjusted and he is now taking gabapentin at bedtime, his cholesterol medicine was also changed.  We talked about getting refills from his PCP office but he wants to keep the follow-up appointment in case if he needed adjustment of the medication.  I agree with the plan.  From now on all his psychiatric medication will refill by his PCP Dr. Kelby Fam office.  He is taking Wellbutrin XL 150 mg in the morning, trazodone 150 mg at bedtime, lamotrigine 100 mg  daily.  He also taking gabapentin for neuropathy.  Patient is stable on these medication.  We will follow up in 6 months unless patient need an earlier appointment.  Patient agreed with the plan.  No new medication or refills given on this appointment.  He will contact doctor's office for future refills.  Follow Up Instructions:    I discussed the assessment and treatment plan with the patient. The patient was provided an opportunity to ask questions and all were answered. The patient agreed with the plan and demonstrated an  understanding of the instructions.   The patient was advised to call back or seek an in-person evaluation if the symptoms worsen or if the condition fails to improve as anticipated.  I provided 19 minutes of non-face-to-face time during this encounter.   Kathlee Nations, MD

## 2021-01-12 ENCOUNTER — Telehealth (HOSPITAL_COMMUNITY): Payer: Self-pay | Admitting: *Deleted

## 2021-01-12 NOTE — Telephone Encounter (Signed)
Ok will do. Offered to pt during conversation.

## 2021-01-12 NOTE — Telephone Encounter (Signed)
I returned patient's phone call.  Reception of the phone was not clear but patient told that he needed to go back to [facility] for therapy and like to have our office call her for referral.  Please call Trey Paula for patient's referral so he can resume therapy.

## 2021-01-12 NOTE — Telephone Encounter (Signed)
Pt called requesting to speak to you regarding seeing therapist in house that does couples counseling. Writer advised pt that we currently do not have anyone in this practice who does couples. Writer then gave pt suggestions, referrals sources in [place], per pt preference. Pt declined preferring to speak with you directly about this matter. Pt asked that you call him @ [phone]. Thanks.

## 2021-01-23 ENCOUNTER — Telehealth (HOSPITAL_COMMUNITY): Payer: Self-pay

## 2021-01-23 NOTE — Telephone Encounter (Signed)
Patient called and informed him again that Colin Galvan has retired so the patient & wife cannot go back to [facility] for therapy. Gave him the name, phone number, and address of the couples therapist that they can try at [facility] [phone]).  Patient stated that he would like you to call him at your earliest convenience. Thank you

## 2021-01-26 NOTE — Telephone Encounter (Signed)
We have been told that Dr. Harrington Challenger is no longer accepting adult pts but I have contacted [place] office to inquire.

## 2021-01-26 NOTE — Telephone Encounter (Signed)
Referral made to [facility], Dr. Ulice Brilliant.

## 2021-01-26 NOTE — Telephone Encounter (Signed)
Correction referral made to [facility].

## 2021-01-26 NOTE — Telephone Encounter (Signed)
I returned patient's phone call.  He is concerned about his wife who is in crisis and taking antidepressant from PCP.  He like his wife to be seen by psychiatrist preferably a male psychiatrist.  Please contact regional office as patient lives close to the [place] to schedule with Dr. Harrington Challenger.

## 2021-03-03 ENCOUNTER — Ambulatory Visit (INDEPENDENT_AMBULATORY_CARE_PROVIDER_SITE_OTHER): Payer: Medicare Other | Admitting: Psychologist

## 2021-03-03 ENCOUNTER — Other Ambulatory Visit: Payer: Self-pay

## 2021-03-03 DIAGNOSIS — Z630 Problems in relationship with spouse or partner: Secondary | ICD-10-CM | POA: Diagnosis not present

## 2021-03-03 DIAGNOSIS — F331 Major depressive disorder, recurrent, moderate: Secondary | ICD-10-CM | POA: Diagnosis not present

## 2021-03-03 NOTE — Progress Notes (Signed)
[facility] Counselor Initial Adult Exam  Name: Colin Galvan Date: 03/03/2021 MRN: [id number] DOB: 11/13/1945 PCP: Vicente Males, MD  Time spent: 10:02 am to 10:34 am; Total Time: 32 minutes  This session was held via in person. The patient consented to in-person therapy and was in the clinician's office. Limits of confidentiality were discussed with the patient.    Guardian/Payee:  NA    Paperwork requested: No   Reason for Visit /Presenting Problem: Patient stated that he is seeking counseling for anger management.   Mental Status Exam: Appearance:   Well Groomed     Behavior:  Appropriate  Motor:  Normal  Speech/Language:   Normal Rate  Affect:  Appropriate  Mood:  normal  Thought process:  normal  Thought content:    WNL  Sensory/Perceptual disturbances:    WNL  Orientation:  oriented to person, place, time/date, and situation  Attention:  Good  Concentration:  Good  Memory:  WNL  Fund of knowledge:   Good  Insight:    Poor  Judgment:   Fair  Impulse Control:  Good     Reported Symptoms:  Patient endorsed experiencing the following: irritability, rumination of negative thoughts, social isolation, avoiding pleasurable activities, difficult interpersonal connections with spouse, feeling sad, thoughts of worthlessness, and sometimes lack of motivation. He denied suicidal and homicidal ideation.   Risk Assessment: Danger to Self:  No Self-injurious Behavior: No Danger to Others: No Duty to Warn:no Physical Aggression / Violence:No  Access to Firearms a concern: No  Gang Involvement:No  Patient / guardian was educated about steps to take if suicide or homicide risk level increases between visits: n/a While future psychiatric events cannot be accurately predicted, the patient does not currently require acute inpatient psychiatric care and does not currently meet [facility] involuntary commitment criteria.  Substance Abuse History: Current  substance abuse: No     Past Psychiatric History:   Previous psychological history is significant for depression Outpatient Providers:Patient sees a psychiatrist. Patient has never done individual counseling. Patient has participated in couples counseling previously.  History of Psych Hospitalization: No  Psychological Testing:  NA    Abuse History:  Victim of: No.,  NA    Report needed: No. Victim of Neglect:No. Perpetrator of  NA   Witness / Exposure to Domestic Violence: No   Protective Services Involvement: No  Witness to [employer] Violence:  No   Family History:  Family History  Problem Relation Age of Onset   Colon cancer Father        Deceased, 32   Stroke Father    Melanoma Mother        Deceased, 44   Hypertension Mother    Diabetes Mellitus II Brother    Stroke Brother        Deceased, 24   Depression Son     Living situation: the patient lives with their spouse  Sexual Orientation: Straight  Relationship Status: married  Name of spouse / other: Wife's name is Engineer, agricultural. They have been married for 30 years. Patient was married to his first wife for 10 years. Per the patient, that marriage ended because she was unfaithful.  If a parent, number of children / ages:Patient stated that he has one biological son and that Massie Maroon has a son and daughter from a previous relationship.   Support Systems: spouse  Financial Stress:  No   Income/Employment/Disability: Patient is currently retired. Patient was a Chief Executive Officer.   Military Service: No  Educational History: Education: post Forensic psychologist work or degree  Religion/Sprituality/World View: Patient did not disclose.   Any cultural differences that may affect / interfere with treatment:  NA  Recreation/Hobbies: Patient endorsed doing several different activities.   Stressors: Marital or family conflict  . Patient stated that his wife is the main source of distress and reason that he is seeking out counseling services.  Per the patient, he indicated that they have a difficult time communicating with each other. Wife has felt unheard for 30 years.   Strengths: Self Advocate  Barriers:  NA   Legal History: Pending legal issue / charges:  NA. History of legal issue / charges:  NA  Medical History/Surgical History: reviewed Past Medical History:  Diagnosis Date   Allergic rhinitis    Depression    Family hx of colon cancer    Family hx of melanoma    Fatigue    Gout    Hx of colonic polyps    Hyperlipidemia    Hypertension    Nephrolithiasis    Other diseases of lung, not elsewhere classified    Vitamin D deficiency     Past Surgical History:  Procedure Laterality Date   KNEE ARTHROSCOPY Right    MELANOMA EXCISION N/A 07/2016   ROTATOR CUFF REPAIR Right 02/2016   SKIN CANCER EXCISION      Medications: Current Outpatient Medications  Medication Sig Dispense Refill   allopurinol (ZYLOPRIM) 300 MG tablet Take 300 mg by mouth daily.       aspirin 81 MG tablet Take 81 mg by mouth daily.       buPROPion (WELLBUTRIN XL) 150 MG 24 hr tablet TAKE 1 TABLET BY MOUTH ONCE DAILY IN THE MORNING 90 tablet 0   carvedilol (COREG) 3.125 MG tablet Take 3.125 mg by mouth 2 (two) times daily.     Cetirizine HCl 10 MG CAPS Take by mouth.     gabapentin (NEURONTIN) 300 MG capsule Take 300 mg by mouth every morning.     lamoTRIgine (LAMICTAL) 100 MG tablet Take 100 mg by mouth daily.     pantoprazole (PROTONIX) 40 MG tablet Take 40 mg by mouth every morning.     rosuvastatin (CRESTOR) 40 MG tablet Take 40 mg by mouth at bedtime.     sildenafil (VIAGRA) 100 MG tablet See admin instructions.     traZODone (DESYREL) 50 MG tablet TAKE 3 TABLETS BY MOUTH AT BEDTIME 90 tablet 0   No current facility-administered medications for this visit.    Allergies  Allergen Reactions   Tetracycline Hcl     Diagnoses:  F33.1 major depressive affective disorder, recurrent, moderate and Z63.0 relationship distress with  spouse or partner   Plan of Care: The patient is a 75 year old Caucasian male w ho was referred due to experiencing anger challenges impacting his relationship with his wife. The patient lives at home with his wife. The patient meets criteria for a diagnosis of F33.1 major depressive affective disorder, recurrent, moderate based off of the following: irritability, rumination of negative thoughts, social isolation, avoiding pleasurable activities, difficult interpersonal connections with spouse, feeling sad, thoughts of worthlessness, and sometimes lack of motivation. He denied suicidal and homicidal ideation. He also meets criteria for a diagnosis of Z63.0 relationship distress with spouse or partner.   The patient stated that he wants to improve his relationship with his wife.   This psychologist makes the recommendation that the patient participate in at least counseling once a monthly  if not more consistently to address concerns.     Conception Chancy, PsyD

## 2021-03-03 NOTE — Plan of Care (Signed)
Goals Alleviate depressive symptoms Recognize, accept, and cope with depressive feelings Develop healthy thinking patterns Develop healthy interpersonal relationships  Objectives Cooperate with a medication evaluation by a physician Verbalize an accurate understanding of depression Verbalize an understanding of the treatment Identify and replace thoughts that support depression Learn and implement behavioral strategies Verbalize an understanding and resolution of current interpersonal problems Learn and implement problem solving and decision making skills Learn and implement conflict resolution skills to resolve interpersonal problems Verbalize an understanding of healthy and unhealthy emotions verbalize insight into how past relationships may be influence current experiences with depression Use mindfulness and acceptance strategies and increase value based behavior  Increase hopeful statements about the future.  Interventions Consistent with treatment model, discuss how change in cognitive, behavioral, and interpersonal can help client alleviate depression CBT Behavioral activation help the client explore the relationship, nature of the dispute,  Help the client develop new interpersonal skills and relationships Conduct Problem so living therapy Teach conflict resolution skills Use a process-experiential approach Conduct TLDP Conduct ACT Evaluate need for psychotropic medication Monitor adherence to medication    Problem: Depression CCP Problem  1 Alleviate depression and improve relationship with spouse.  Goal:  " Enhance ability to effectively cope with full variety of stressors Outcome: Not Progressing Goal: Improve Communication skills with spouse Outcome: Not Progressing Goal: " Learn and implement coping skills that result in a reduction of anxiety  Outcome: Not Progressing Goal: Improve interpersonal interactions with spouse Outcome: Not Progressing Goal: Live Life to  its fullest and use values to guide a rich and fulfilling life.  Outcome: Not Progressing Goal: LTG: Reduce frequency, intensity, and duration of depression symptoms as evidenced by: SSB input needed on appropriate metric Outcome: Not Progressing Goal: LTG: Kayo WILL SCORE LESS THAN 10 ON THE PATIENT HEALTH QUESTIONNAIRE (PHQ-9) Outcome: Not Progressing

## 2021-04-07 ENCOUNTER — Ambulatory Visit (INDEPENDENT_AMBULATORY_CARE_PROVIDER_SITE_OTHER): Payer: Medicare Other | Admitting: Psychologist

## 2021-04-07 ENCOUNTER — Other Ambulatory Visit: Payer: Self-pay

## 2021-04-07 DIAGNOSIS — Z630 Problems in relationship with spouse or partner: Secondary | ICD-10-CM | POA: Diagnosis not present

## 2021-04-07 DIAGNOSIS — F331 Major depressive disorder, recurrent, moderate: Secondary | ICD-10-CM

## 2021-04-07 NOTE — Progress Notes (Signed)
                Nymir Ringler, PsyD 

## 2021-04-07 NOTE — Progress Notes (Signed)
[place] Counselor/Therapist Progress Note  Patient ID: KESHAN REHA, MRN: [id number],    Date: 04/07/2021  Time Spent: 9:03 am to 9:50 am; 47 minutes   This session was held via in person. The patient consented to in-person therapy and was in the clinician's office. Limits of confidentiality were discussed with the patient.   Treatment Type:  Individual therapy  Reported Symptoms: Conflict with spouse  Mental Status Exam: Appearance:  Neat     Behavior: Appropriate  Motor: Normal  Speech/Language:  Clear and Coherent  Affect: Appropriate  Mood: normal  Thought process: normal  Thought content:   WNL  Sensory/Perceptual disturbances:   WNL  Orientation: oriented to person, place, and time/date  Attention: Good  Concentration: Good  Memory: WNL  Fund of knowledge:  Good  Insight:   Fair  Judgment:  Fair  Impulse Control: Good   Risk Assessment: Danger to Self:  No Self-injurious Behavior: No Danger to Others: No Duty to Warn:no Physical Aggression / Violence:No  Access to Firearms a concern: No  Gang Involvement:No   Subjective: Beginning the session, patient stated that he wanted coping strategies. After discussing mindfulness he stated he felt better. From there, he spent the session reflecting on specific challenges he experiences with his wife. He reflected on how he attempts to validate her. He also spent time talking about how he attempts to support his wife, however feels as though she does not hear his support. He also spent time disclosing that he believes that his spouse needs therapy to address concerns. He also described himself as being informed to know reasons as to why she does some of the behaviors that she exhibits and understands her emotions. Per the patient, he wants to  work with a therapist who can better facilitate better communication with spouse. He denied suicidal and homicidal ideation.    Interventions:  Worked on developing  a therapeutic relationship with the patient using active listening and reflective statements. Provided emotional support using empathy and validation. Reviewed the treatment plan with the patient. Reviewed events since the intake. Provided psychoeducation about mindfulness. Practiced and processed mindfulness. Assisted in problem solving. Explored how the patient attempts to communicate with the spouse. Provided psychoeducation about validation. Explored and processed how patient could implement validation into communication. Used socratic questions to assist the patient gain insight into self. Challenged the patient with some of the thoughts expressed. Attempted to assist the patient gain insight into reasons that spouse may be resistant to therapy. Attempted to explore sitting with spouse in pain expressed. Discussed next steps. Discussed how patient may benefit from doing individual work with someone who specializes in couples work. Assessed for suicidal and homicidal ideation.  Homework: NA  Next Session: NA  Diagnosis: F33.1 major depressive affective disorder, recurrent, moderate and Z63.0 relationship distress with spouse or partner.   Plan:   Client Abilities: Friendly  Client Preferences: Couples communication therapist  Client statement of Needs:  Improve communication with spouse  Treatment Level: Outpaitnet   Goals Alleviate depressive symptoms Recognize, accept, and cope with depressive feelings Develop healthy thinking patterns Develop healthy interpersonal relationships  Objectives target date for all objectives is 03/03/2022.  Cooperate with a medication evaluation by a physician Verbalize an accurate understanding of depression Verbalize an understanding of the treatment Identify and replace thoughts that support depression Learn and implement behavioral strategies Verbalize an understanding and resolution of current interpersonal problems Learn and implement problem  solving and decision making skills Learn  and implement conflict resolution skills to resolve interpersonal problems Verbalize an understanding of healthy and unhealthy emotions verbalize insight into how past relationships may be influence current experiences with depression Use mindfulness and acceptance strategies and increase value based behavior  Increase hopeful statements about the future.  Interventions Consistent with treatment model, discuss how change in cognitive, behavioral, and interpersonal can help client alleviate depression CBT Behavioral activation help the client explore the relationship, nature of the dispute,  Help the client develop new interpersonal skills and relationships Conduct Problem so living therapy Teach conflict resolution skills Use a process-experiential approach Conduct TLDP Conduct ACT Evaluate need for psychotropic medication Monitor adherence to medication   The patient and clinician reviewed the treatment plan on 04/07/2021. The patient approved of the treatment plan.   Conception Chancy, PsyD

## 2021-05-06 ENCOUNTER — Other Ambulatory Visit: Payer: Self-pay

## 2021-05-06 ENCOUNTER — Encounter (HOSPITAL_COMMUNITY): Payer: Self-pay | Admitting: Psychiatry

## 2021-05-06 ENCOUNTER — Telehealth (HOSPITAL_BASED_OUTPATIENT_CLINIC_OR_DEPARTMENT_OTHER): Payer: Medicare Other | Admitting: Psychiatry

## 2021-05-06 VITALS — Wt 139.0 lb

## 2021-05-06 DIAGNOSIS — F419 Anxiety disorder, unspecified: Secondary | ICD-10-CM

## 2021-05-06 DIAGNOSIS — R4586 Emotional lability: Secondary | ICD-10-CM

## 2021-05-06 DIAGNOSIS — F331 Major depressive disorder, recurrent, moderate: Secondary | ICD-10-CM

## 2021-05-06 NOTE — Progress Notes (Signed)
Virtual Visit via Telephone Note  I connected with Colin Galvan on 05/06/21 at  9:00 AM EST by telephone and verified that I am speaking with the correct person using two identifiers.  Location: Patient: Home Provider: Home Office   I discussed the limitations, risks, security and privacy concerns of performing an evaluation and management service by telephone and the availability of in person appointments. I also discussed with the patient that there may be a patient responsible charge related to this service. The patient expressed understanding and agreed to proceed.   History of Present Illness: Patient is evaluated by phone session.  He is a stable on his current medication.  He denies any outbursts, anger, extreme mood lability.  He started marriage counseling with his wife with a therapist Fransico Meadow in [place] and so far he feels a good Orthoptist.  He tried a Transport planner at [employer] but did not like this and not able to connect with him.  He feels things are going okay.  He tried to go to gym 3 days a week.  He sleeps okay.  He denies any crying spells or any feeling of hopelessness or worthlessness.  He denies any panic attack.  His energy level is fair.  He sleeps good.  He reported relationship with the wife is much better.  He had a good holidays with the family.  He has no tremors, shakes or any rash.  He is getting all his psychotropic medication from his PCP Dr. Currie Paris.  Past Psychiatric History: Reviewed H/O depression and anxiety. Tried Cymbalta, Klonopin and Prestiq.  No h/o mania, psychosis, inpatient treatment or suicidal attempt. We tried increasing Lamictal but he had itching.     Psychiatric Specialty Exam: Physical Exam  Review of Systems  Weight 139 lb (63 kg).There is no height or weight on file to calculate BMI.  General Appearance: NA  Eye Contact:  NA  Speech:  Slow  Volume:  Decreased  Mood:  Euthymic  Affect:  NA  Thought Process:  Goal Directed   Orientation:  Full (Time, Place, and Person)  Thought Content:  Logical  Suicidal Thoughts:  No  Homicidal Thoughts:  No  Memory:  Immediate;   Good Recent;   Fair Remote;   Fair  Judgement:  Intact  Insight:  Present  Psychomotor Activity:  NA  Concentration:  Concentration: Fair and Attention Span: Fair  Recall:  [employer] of Knowledge:  Fair  Language:  Good  Akathisia:  No  Handed:  Right  AIMS (if indicated):     Assets:  Communication Skills Desire for Improvement Housing Social Support Transportation  ADL's:  Intact  Cognition:  WNL  Sleep:   ok      Assessment and Plan: Major depressive disorder, recurrent.  Anxiety.  Mood lability.  Patient is a stable on his current medication.  He would like to have a follow-up every 6 months with a psychiatrist but he is getting his medication from his PCP Dr. Landry Corporal.  There are no changes in his medication or dosage.  He is getting trazodone 150 mg at bedtime, Lamictal 100 mg daily and Wellbutrin XL 150 mg in the morning.  He like marriage counseling and he like to keep it.  We discussed medication side effects and benefits.  Like to follow up in 6 months.  I recommend to call us back if there is a change or symptoms started to get worse.  Follow Up Instructions:  I discussed the assessment and treatment plan with the patient. The patient was provided an opportunity to ask questions and all were answered. The patient agreed with the plan and demonstrated an understanding of the instructions.   The patient was advised to call back or seek an in-person evaluation if the symptoms worsen or if the condition fails to improve as anticipated.  I provided 18 minutes of non-face-to-face time during this encounter.   Kathlee Nations, MD

## 2021-06-02 ENCOUNTER — Other Ambulatory Visit (HOSPITAL_COMMUNITY): Payer: Self-pay | Admitting: Psychiatry

## 2021-06-02 DIAGNOSIS — F419 Anxiety disorder, unspecified: Secondary | ICD-10-CM

## 2021-06-02 DIAGNOSIS — F331 Major depressive disorder, recurrent, moderate: Secondary | ICD-10-CM

## 2021-06-11 ENCOUNTER — Other Ambulatory Visit (HOSPITAL_COMMUNITY): Payer: Self-pay | Admitting: Psychiatry

## 2021-06-11 DIAGNOSIS — F331 Major depressive disorder, recurrent, moderate: Secondary | ICD-10-CM

## 2021-06-11 DIAGNOSIS — F419 Anxiety disorder, unspecified: Secondary | ICD-10-CM

## 2021-06-12 ENCOUNTER — Other Ambulatory Visit (HOSPITAL_COMMUNITY): Payer: Self-pay | Admitting: Psychiatry

## 2021-06-12 DIAGNOSIS — F331 Major depressive disorder, recurrent, moderate: Secondary | ICD-10-CM

## 2021-06-12 DIAGNOSIS — F419 Anxiety disorder, unspecified: Secondary | ICD-10-CM

## 2021-06-15 ENCOUNTER — Telehealth (HOSPITAL_COMMUNITY): Payer: Self-pay | Admitting: *Deleted

## 2021-06-15 NOTE — Telephone Encounter (Signed)
Thanks.  ?Yes,  his PCP writing all his psych meds.  ?

## 2021-06-15 NOTE — Telephone Encounter (Signed)
Pt left message on Friday 06/12/21 after business hours requesting a refill of Wellbutrin XL150 mg. Pt was advised that per Dr. Marguerite Olea last visit note (05/06/21), pt PCP is writing for that medication. Last script from our office was written on 08/01/20. Pt verbalized understanding. FYI. ?

## 2021-11-03 ENCOUNTER — Telehealth (HOSPITAL_COMMUNITY): Payer: Medicare Other | Admitting: Psychiatry

## 2021-11-11 ENCOUNTER — Telehealth (HOSPITAL_BASED_OUTPATIENT_CLINIC_OR_DEPARTMENT_OTHER): Payer: Medicare Other | Admitting: Psychiatry

## 2021-11-11 ENCOUNTER — Encounter (HOSPITAL_COMMUNITY): Payer: Self-pay | Admitting: Psychiatry

## 2021-11-11 VITALS — Wt 137.0 lb

## 2021-11-11 DIAGNOSIS — R4586 Emotional lability: Secondary | ICD-10-CM

## 2021-11-11 DIAGNOSIS — F419 Anxiety disorder, unspecified: Secondary | ICD-10-CM | POA: Diagnosis not present

## 2021-11-11 DIAGNOSIS — F331 Major depressive disorder, recurrent, moderate: Secondary | ICD-10-CM | POA: Diagnosis not present

## 2021-11-11 NOTE — Progress Notes (Signed)
Virtual Visit via Telephone Note  I connected with Colin Galvan on 11/11/21 at  4:00 PM EDT by telephone and verified that I am speaking with the correct person using two identifiers.  Location: Patient: Home Provider: Home Office   I discussed the limitations, risks, security and privacy concerns of performing an evaluation and management service by telephone and the availability of in person appointments. I also discussed with the patient that there may be a patient responsible charge related to this service. The patient expressed understanding and agreed to proceed.   History of Present Illness: Patient is evaluated by phone session.  He has been doing very well on his current medication.  His mood is stable and he denies any outbursts, anger, agitation.  He sleeps good.  He is concerned about his weight which is around 137 pounds.  But he recalled he was never obese but now he feels he does not have as much appetite.  Denies any crying spells or any feeling of hopelessness.  He denies any panic attack.  His PCP is giving him all his psychiatric medication.  He is taking trazodone 150 mg at bedtime, Lamictal 100 mg daily and Wellbutrin XL 150 mg in the morning.  His marriage is going very well and he is excited and looking forward to upcoming trip to [place] and [place] on a cruise in October of this year.  He has no rash, itching tremors or shakes.  Like to keep the current medication from his PCP but wants to follow up with [place] every 6 months.  Past Psychiatric History: Reviewed H/O depression and anxiety. Tried Cymbalta, Klonopin and Prestiq.  No h/o mania, psychosis, inpatient treatment or suicidal attempt. We tried increasing Lamictal but he had itching.     Psychiatric Specialty Exam: Physical Exam  Review of Systems  Weight 137 lb (62.1 kg).There is no height or weight on file to calculate BMI.  General Appearance: NA  Eye Contact:  NA  Speech:  Slow  Volume:  Normal  Mood:   Euthymic  Affect:  NA  Thought Process:  Goal Directed  Orientation:  Full (Time, Place, and Person)  Thought Content:  Logical  Suicidal Thoughts:  No  Homicidal Thoughts:  No  Memory:  Immediate;   Good Recent;   Fair Remote;   Fair  Judgement:  Intact  Insight:  Present  Psychomotor Activity:  NA  Concentration:  Concentration: Fair and Attention Span: Fair  Recall:  Good  Fund of Knowledge:  Good  Language:  Good  Akathisia:  No  Handed:  Right  AIMS (if indicated):     Assets:  Communication Skills Desire for Improvement Housing Social Support Transportation  ADL's:  Intact  Cognition:  WNL  Sleep:   ok      Assessment and Plan: Major depressive disorder, recurrent.  Anxiety.  Mood lability.  Patient is stable on his current medication.  He is getting his all psychiatric medication from his PCP Dr. Legrand Como.  He Is on Trazodone 150 Mg at Bedtime, Lamictal 100 Mg Daily and Wellbutrin XL 150 Mg Daily.  I Recommend to Call us Back with Any Question or Any Concern.  Patient like to Have a Follow-Up in 6 Months.  Follow Up Instructions:    I discussed the assessment and treatment plan with the patient. The patient was provided an opportunity to ask questions and all were answered. The patient agreed with the plan and demonstrated an understanding of the instructions.  The patient was advised to call back or seek an in-person evaluation if the symptoms worsen or if the condition fails to improve as anticipated.  Collaboration of Care: Primary Care Provider AEB notes are available in epic to review.  Patient/Guardian was advised Release of Information must be obtained prior to any record release in order to collaborate their care with an outside provider. Patient/Guardian was advised if they have not already done so to contact the registration department to sign all necessary forms in order for [place] to release information regarding their care.   Consent: Patient/Guardian  gives verbal consent for treatment and assignment of benefits for services provided during this visit. Patient/Guardian expressed understanding and agreed to proceed.    I provided 18 minutes of non-face-to-face time during this encounter.   Kathlee Nations, MD

## 2022-05-13 ENCOUNTER — Ambulatory Visit (HOSPITAL_BASED_OUTPATIENT_CLINIC_OR_DEPARTMENT_OTHER): Payer: Medicare Other | Admitting: Psychiatry

## 2022-05-13 VITALS — BP 125/79 | HR 78 | Resp 18 | Ht 68.0 in | Wt 148.6 lb

## 2022-05-13 DIAGNOSIS — F419 Anxiety disorder, unspecified: Secondary | ICD-10-CM | POA: Diagnosis not present

## 2022-05-13 DIAGNOSIS — Z630 Problems in relationship with spouse or partner: Secondary | ICD-10-CM | POA: Diagnosis not present

## 2022-05-13 DIAGNOSIS — R4586 Emotional lability: Secondary | ICD-10-CM | POA: Diagnosis not present

## 2022-05-13 DIAGNOSIS — F331 Major depressive disorder, recurrent, moderate: Secondary | ICD-10-CM

## 2022-05-13 MED ORDER — LAMOTRIGINE 25 MG PO TABS: ORAL_TABLET | ORAL | 0 refills | Status: DC

## 2022-05-13 NOTE — Progress Notes (Signed)
[facility] MD/PA/NP OP Progress Note  05/13/2022 3:29 PM Colin Galvan  MRN:  [id number]  Chief Complaint:  Chief Complaint  Patient presents with   Follow-up   Depression   HPI: Patient came for his appointment.  He reported lately more stress in his marriage.  There are moments when he get upset and irritable.  Today patient brought list of concerns that he has as he has throat pain and cannot talk.  He told his wife believe he is bipolar because he had anger issues.  Patient told his wife believes he control everything in her life and last week threatened that she will leave him.  Patient does not want to end the marriage but also like to have peace in his life.  He is sleeping okay.  Patient told they have tried counselor last year but that did not work.  Patient admitted he has a type A personality and sometime he did like to do things on his a style but his wife have issues with that.  They have been together for 27 years.  Patient admitted getting frustrated, sad, depressed and not sure what to do.  He denies any suicidal thoughts.  Patient told they have a lot of short and long trips together.  They have a good time in the past and they are also have upcoming trips to a few places.  Patient not sure how to handle his wife as she blamed him for everything.  He is taking Lamictal, trazodone and Wellbutrin from his primary care physician.  He denies any hallucination or any paranoia.  He denies any thoughts of hurting himself or someone others.  Visit Diagnosis:    ICD-10-CM   1. Labile mood  R45.86 lamoTRIgine (LAMICTAL) 25 MG tablet    2. Moderate episode of recurrent major depressive disorder (HCC)  F33.1 lamoTRIgine (LAMICTAL) 25 MG tablet    3. Marital or partner relational problem  Z63.0 lamoTRIgine (LAMICTAL) 25 MG tablet    4. Anxiety  F41.9 lamoTRIgine (LAMICTAL) 25 MG tablet       Past Psychiatric History: Reviewed H/O depression and anxiety. Tried Cymbalta, Klonopin and Prestiq.   No h/o mania, psychosis, inpatient treatment or suicidal attempt. We tried increasing Lamictal but he had itching.    Past Medical History:  Past Medical History:  Diagnosis Date   Allergic rhinitis    Depression    Family hx of colon cancer    Family hx of melanoma    Fatigue    Gout    Hx of colonic polyps    Hyperlipidemia    Hypertension    Nephrolithiasis    Other diseases of lung, not elsewhere classified    Vitamin D deficiency     Past Surgical History:  Procedure Laterality Date   KNEE ARTHROSCOPY Right    MELANOMA EXCISION N/A 07/2016   ROTATOR CUFF REPAIR Right 02/2016   SKIN CANCER EXCISION      Family Psychiatric History: Reviewed.  Family History:  Family History  Problem Relation Age of Onset   Colon cancer Father        Deceased, 73   Stroke Father    Melanoma Mother        Deceased, 72   Hypertension Mother    Diabetes Mellitus II Brother    Stroke Brother        Deceased, 8   Depression Son     Social History:  Social History   Socioeconomic History   Marital  status: Married    Spouse name: Not on file   Number of children: Not on file   Years of education: Not on file   Highest education level: Not on file  Occupational History   Occupation: RETIRED  Tobacco Use   Smoking status: Former    Types: Cigarettes    Quit date: 03/22/1980    Years since quitting: 42.1   Smokeless tobacco: Never  Vaping Use   Vaping Use: Never used  Substance and Sexual Activity   Alcohol use: Yes    Alcohol/week: 9.0 standard drinks of alcohol    Types: 3 Glasses of wine, 6 Cans of beer per week    Comment: 1 beer/mixed drink daily   Drug use: No   Sexual activity: Yes    Partners: Female    Birth control/protection: None  Other Topics Concern   Not on file  Social History Narrative   Lives with wife.    Retired Forensic psychologist, Chief Strategy Officer.   Highest level of education:  JD   Social Determinants of Health   Financial Resource Strain: Not on file   Food Insecurity: Not on file  Transportation Needs: Not on file  Physical Activity: Not on file  Stress: Not on file  Social Connections: Not on file    Allergies:  Allergies  Allergen Reactions   Tetracycline Hcl     Metabolic Disorder Labs: No results found for: "HGBA1C", "MPG" No results found for: "PROLACTIN" No results found for: "CHOL", "TRIG", "HDL", "CHOLHDL", "VLDL", "LDLCALC" Lab Results  Component Value Date   TSH 1.728 06/25/2014    Therapeutic Level Labs: No results found for: "LITHIUM" No results found for: "VALPROATE" No results found for: "CBMZ"  Current Medications: Current Outpatient Medications  Medication Sig Dispense Refill   allopurinol (ZYLOPRIM) 300 MG tablet Take 300 mg by mouth daily.       aspirin 81 MG tablet Take 81 mg by mouth daily.       buPROPion (WELLBUTRIN XL) 150 MG 24 hr tablet TAKE 1 TABLET BY MOUTH ONCE DAILY IN THE MORNING 90 tablet 0   carvedilol (COREG) 3.125 MG tablet Take 3.125 mg by mouth 2 (two) times daily.     Cetirizine HCl 10 MG CAPS Take by mouth.     gabapentin (NEURONTIN) 300 MG capsule Take 300 mg by mouth every morning. (Patient not taking: Reported on 11/11/2021)     lamoTRIgine (LAMICTAL) 100 MG tablet Take 100 mg by mouth daily.     pantoprazole (PROTONIX) 40 MG tablet Take 40 mg by mouth every morning.     rosuvastatin (CRESTOR) 40 MG tablet Take 40 mg by mouth at bedtime.     sildenafil (VIAGRA) 100 MG tablet See admin instructions.     traZODone (DESYREL) 50 MG tablet TAKE 3 TABLETS BY MOUTH AT BEDTIME 90 tablet 0   No current facility-administered medications for this visit.     Musculoskeletal: Strength & Muscle Tone: within normal limits Gait & Station: normal Patient leans: N/A  Psychiatric Specialty Exam: Review of Systems  Blood pressure 125/79, pulse 78, resp. rate 18, height 5' 8"$  (1.727 m), weight 148 lb 9.6 oz (67.4 kg).Body mass index is 22.59 kg/m.  General Appearance: Casual  Eye  Contact:  Good  Speech:  Slow  Volume:  Decreased  Mood:  Anxious and Depressed  Affect:  Constricted  Thought Process:  Goal Directed  Orientation:  Full (Time, Place, and Person)  Thought Content: Rumination   Suicidal Thoughts:  No  Homicidal Thoughts:  No  Memory:  Immediate;   Fair Recent;   Good Remote;   Fair  Judgement:  Intact  Insight:  Present  Psychomotor Activity:  Decreased  Concentration:  Concentration: Fair and Attention Span: Fair  Recall:  Good  Fund of Knowledge: Good  Language: Good  Akathisia:  No  Handed:  Right  AIMS (if indicated): not done  Assets:  Communication Skills Desire for Improvement Housing Transportation  ADL's:  Intact  Cognition: WNL  Sleep:  Fair   Screenings: Flowsheet Row Video Visit from 08/01/2020 in [facility]  C-SSRS RISK CATEGORY No Risk        Assessment and Plan: Major depression disorder, recurrent.  Anxiety, mood lability, marital stress.  Discussed his chronic stress related to his marriage.  In the past he had tried marriage counseling that did not work.  Patient like to have peace in his life.  We discussed about alternative methods and patient not sure if separation will help the issue.  Though his wife did not decided to leave him but lately she has been talking about leaving.  I again emphasized that they need to find a neutral person to resolve marital issues.  Patient believes that his wife has a lot of medical issues that she did not want to discuss or address.  I recommend one possibility is that he can talk to her wife's doctor to prescribe medication after his wife give the permission to talk to her doctor.  Patient admitted lately feeling sad and depressed and I recommend trying higher dose of Lamictal to address his symptoms.  After some discussion patient agreed with the plan.  He is getting all his medication from his primary care doctor.  We will start Lamictal 25 mg  daily for 1 week and then 50 mg daily in addition to 100 mg he is taking from his PCP.  Offered therapy but patient declined.  Discussed safety concern that anytime having active suicidal thoughts or homicidal thought any need to call 911 or go to local emergency room.  Patient like to have a follow-up in 6 months however agree if Lamictal help then he will call to get more refills.  Collaboration of Care: Collaboration of Care: Other provider involved in patient's care AEB notes are available in epic to review.  Patient/Guardian was advised Release of Information must be obtained prior to any record release in order to collaborate their care with an outside provider. Patient/Guardian was advised if they have not already done so to contact the registration department to sign all necessary forms in order for [place] to release information regarding their care.   Consent: Patient/Guardian gives verbal consent for treatment and assignment of benefits for services provided during this visit. Patient/Guardian expressed understanding and agreed to proceed.    Kathlee Nations, MD 05/13/2022, 3:29 PM

## 2022-06-01 ENCOUNTER — Other Ambulatory Visit (HOSPITAL_COMMUNITY): Payer: Self-pay | Admitting: Psychiatry

## 2022-06-01 DIAGNOSIS — F331 Major depressive disorder, recurrent, moderate: Secondary | ICD-10-CM

## 2022-06-01 DIAGNOSIS — Z630 Problems in relationship with spouse or partner: Secondary | ICD-10-CM

## 2022-06-01 DIAGNOSIS — F419 Anxiety disorder, unspecified: Secondary | ICD-10-CM

## 2022-06-01 DIAGNOSIS — R4586 Emotional lability: Secondary | ICD-10-CM

## 2022-06-04 ENCOUNTER — Other Ambulatory Visit (HOSPITAL_COMMUNITY): Payer: Self-pay | Admitting: Psychiatry

## 2022-06-04 DIAGNOSIS — R4586 Emotional lability: Secondary | ICD-10-CM

## 2022-06-04 DIAGNOSIS — Z630 Problems in relationship with spouse or partner: Secondary | ICD-10-CM

## 2022-06-04 DIAGNOSIS — F331 Major depressive disorder, recurrent, moderate: Secondary | ICD-10-CM

## 2022-06-04 DIAGNOSIS — F419 Anxiety disorder, unspecified: Secondary | ICD-10-CM

## 2022-06-07 ENCOUNTER — Other Ambulatory Visit (HOSPITAL_COMMUNITY): Payer: Self-pay | Admitting: Psychiatry

## 2022-06-07 DIAGNOSIS — R4586 Emotional lability: Secondary | ICD-10-CM

## 2022-06-07 DIAGNOSIS — F419 Anxiety disorder, unspecified: Secondary | ICD-10-CM

## 2022-06-07 DIAGNOSIS — F331 Major depressive disorder, recurrent, moderate: Secondary | ICD-10-CM

## 2022-06-07 DIAGNOSIS — Z630 Problems in relationship with spouse or partner: Secondary | ICD-10-CM

## 2022-06-08 ENCOUNTER — Telehealth (HOSPITAL_COMMUNITY): Payer: Self-pay | Admitting: *Deleted

## 2022-06-08 NOTE — Telephone Encounter (Signed)
Send yesterday with one extra refill. Please inform patient.

## 2022-06-08 NOTE — Telephone Encounter (Signed)
Rx REFILL REQUEST:: [facility]   lamoTRIgine (LAMICTAL) 25 MG tablet     06/07/2023   SIg - Route: Take 2 tablets (50 mg total)  by mouth daily. -     LAST FILL:: 05/13/22  NEXT APPT::  10/22/22 LAST APPT::   05/13/22

## 2022-10-16 ENCOUNTER — Other Ambulatory Visit (HOSPITAL_COMMUNITY): Payer: Self-pay | Admitting: Psychiatry

## 2022-10-16 DIAGNOSIS — F419 Anxiety disorder, unspecified: Secondary | ICD-10-CM

## 2022-10-16 DIAGNOSIS — R4586 Emotional lability: Secondary | ICD-10-CM

## 2022-10-16 DIAGNOSIS — Z630 Problems in relationship with spouse or partner: Secondary | ICD-10-CM

## 2022-10-16 DIAGNOSIS — F331 Major depressive disorder, recurrent, moderate: Secondary | ICD-10-CM

## 2022-11-04 ENCOUNTER — Encounter (HOSPITAL_COMMUNITY): Payer: Self-pay

## 2022-11-08 ENCOUNTER — Telehealth (HOSPITAL_COMMUNITY): Payer: Medicare Other | Admitting: Psychiatry

## 2022-11-11 ENCOUNTER — Telehealth (HOSPITAL_COMMUNITY): Payer: Medicare Other | Admitting: Psychiatry

## 2022-12-15 ENCOUNTER — Telehealth (HOSPITAL_COMMUNITY): Payer: Medicare Other | Admitting: Psychiatry

## 2023-01-11 ENCOUNTER — Other Ambulatory Visit (HOSPITAL_COMMUNITY): Payer: Self-pay | Admitting: Psychiatry

## 2023-01-11 DIAGNOSIS — F331 Major depressive disorder, recurrent, moderate: Secondary | ICD-10-CM

## 2023-02-07 ENCOUNTER — Telehealth (HOSPITAL_BASED_OUTPATIENT_CLINIC_OR_DEPARTMENT_OTHER): Payer: Medicare Other | Admitting: Psychiatry

## 2023-02-07 ENCOUNTER — Encounter (HOSPITAL_COMMUNITY): Payer: Self-pay | Admitting: Psychiatry

## 2023-02-07 VITALS — Wt 142.0 lb

## 2023-02-07 DIAGNOSIS — R4586 Emotional lability: Secondary | ICD-10-CM

## 2023-02-07 DIAGNOSIS — F419 Anxiety disorder, unspecified: Secondary | ICD-10-CM

## 2023-02-07 DIAGNOSIS — F331 Major depressive disorder, recurrent, moderate: Secondary | ICD-10-CM

## 2023-02-07 NOTE — Progress Notes (Signed)
[name]   Colin Galvan Location: Home Provider Location: Office  I connect with Colin Galvan by telephone and verified that I am speaking with correct person by using two identifiers. I discussed the limitations of evaluation and management by telemedicine and the availability of in person appointments. I also discussed with the Colin Galvan that there may be a Colin Galvan responsible charge related to this service. The Colin Galvan expressed understanding and agreed to proceed.  Colin Galvan [id number] 77 y.o.  02/07/2023 9:59 AM  History of Present Illness:  Colin Galvan is evaluated by phone session.  Colin Galvan reported things are going well.  Colin Galvan is sleeping not great but Colin mood reported stable and manageable.  Colin Galvan reported a better relationship with Colin Galvan.  Colin Galvan is looking forward to have Thanksgiving with Colin Galvan who are traveling to [place].  Colin Galvan denies any feeling of hopelessness or worthlessness.  Colin Galvan denies any crying spells or any suicidal thoughts.  Colin appetite is fair.  Colin weight is okay.  Colin Galvan lost few pounds but also trying to keep Colin appetite under control.  Colin Galvan told few weeks ago Colin Galvan has a COVID but Colin Galvan is recovering and feeling better.  Colin Galvan denies any mania, psychosis, agitation, anger, irritability.  Colin Galvan is taking Lamictal 100 mg daily, trazodone 150 mg daily and Wellbutrin XL 150 mg from Colin primary care.  We had prescribed Lamictal 25 mg to add with the 100 to maximum 150 mg but Colin Galvan does not feel Colin Galvan needed.  Colin Galvan is only taking 100 mg Lamictal.  Colin Galvan has no rash, itching tremors or shakes.  Colin Galvan is very comfortable getting Colin medication from Colin primary care Dr. Illene Regulus who works at [facility].  Colin Galvan does not need refills and like to keep the appointment every 6 months for a checkup.  Past Psychiatric History: H/O depression and anxiety. Tried Cymbalta, Klonopin and Prestiq.  No h/o mania, psychosis, inpatient treatment or suicidal attempt. We tried  increasing Lamictal but Colin Galvan had itching.      Outpatient Encounter Medications as of 02/07/2023  Medication Sig   allopurinol (ZYLOPRIM) 300 MG tablet Take 300 mg by mouth daily.     aspirin 81 MG tablet Take 81 mg by mouth daily.     buPROPion (WELLBUTRIN XL) 150 MG 24 hr tablet TAKE 1 TABLET BY MOUTH ONCE DAILY IN THE MORNING   carvedilol (COREG) 3.125 MG tablet Take 3.125 mg by mouth 2 (two) times daily.   Cetirizine HCl 10 MG CAPS Take by mouth.   gabapentin (NEURONTIN) 300 MG capsule Take 300 mg by mouth every morning. (Colin Galvan not taking: Reported on 11/11/2021)   lamoTRIgine (LAMICTAL) 100 MG tablet Take 100 mg by mouth daily.   lamoTRIgine (LAMICTAL) 25 MG tablet Take 2 tablets (50 mg total) by mouth daily.   pantoprazole (PROTONIX) 40 MG tablet Take 40 mg by mouth every morning.   rosuvastatin (CRESTOR) 40 MG tablet Take 40 mg by mouth at bedtime.   sildenafil (VIAGRA) 100 MG tablet See admin instructions.   traZODone (DESYREL) 50 MG tablet TAKE 3 TABLETS BY MOUTH AT BEDTIME   No facility-administered encounter medications on file as of 02/07/2023.    No results found for this or any previous visit (from the past 2160 hour(s)).   Psychiatric Specialty Exam: Physical Exam  Review of Systems  Weight 142 lb (64.4 kg).There is no height or weight on file to calculate BMI.  General Appearance: NA  Eye Contact:  NA  Speech:  Slow  Volume:  Normal  Mood:  Euthymic  Affect:  NA  Thought Process:  Goal Directed  Orientation:  Full (Time, Place, and Person)  Thought Content:  WDL  Suicidal Thoughts:  No  Homicidal Thoughts:  No  Memory:  Immediate;   Good Recent;   Good Remote;   Good  Judgement:  Intact  Insight:  Present  Psychomotor Activity:  NA  Concentration:  Concentration: Fair and Attention Span: Fair  Recall:  [employer] of Knowledge:  Fair  Language:  Good  Akathisia:  No  Handed:  Right  AIMS (if indicated):     Assets:  Communication Skills Desire for  Improvement Housing Resilience Social Support Transportation  ADL's:  Intact  Cognition:  WNL  Sleep:  fair     Assessment/Plan: Moderate episode of recurrent major depressive disorder (HCC)  Anxiety  Labile mood  Colin Galvan doing better on Colin current medication.  Colin Galvan is no longer taking extra lamotrigine which we prescribed.  Colin Galvan is on Lamictal 100 mg daily prescribed by PCP along with Wellbutrin XL 150 mg and trazodone 150 mg at bedtime.  Colin Galvan reported Colin anxiety and mood is much better.  Discussed medication side effects and benefits.  Colin Galvan preferred to get Colin medication from primary care.  However Colin Galvan like to have a follow-up every 6 months.  I recommend to call us back if is any question or any concern.  Follow-up in 6 months.   Follow Up Instructions:     I discussed the assessment and treatment plan with the Colin Galvan. The Colin Galvan was provided an opportunity to ask questions and all were answered. The Colin Galvan agreed with the plan and demonstrated an understanding of the instructions.   The Colin Galvan was advised to call back or seek an in-person evaluation if the symptoms worsen or if the condition fails to improve as anticipated.    Collaboration of Care: Other provider involved in Colin Galvan's care AEB notes are available in epic to review  Colin Galvan/Guardian was advised Release of Information must be obtained prior to any record release in order to collaborate their care with an outside provider. Colin Galvan/Guardian was advised if they have not already done so to contact the registration department to sign all necessary forms in order for [place] to release information regarding their care.   Consent: Colin Galvan/Guardian gives verbal consent for treatment and assignment of benefits for services provided during this visit. Colin Galvan/Guardian expressed understanding and agreed to proceed.     I provided 14 minutes of non face to face time during this encounter.  Note: This document was prepared  by [employer] voice dictation technology and any errors that results from this process are unintentional.    Cleotis Nipper, MD 02/07/2023

## 2023-08-08 ENCOUNTER — Encounter (HOSPITAL_COMMUNITY): Payer: Self-pay

## 2023-08-08 ENCOUNTER — Telehealth (HOSPITAL_BASED_OUTPATIENT_CLINIC_OR_DEPARTMENT_OTHER): Payer: Medicare Other | Admitting: Psychiatry

## 2023-08-08 DIAGNOSIS — Z91199 Patient's noncompliance with other medical treatment and regimen due to unspecified reason: Secondary | ICD-10-CM

## 2023-08-08 NOTE — Progress Notes (Signed)
 Patient is no show today.

## 2023-08-09 ENCOUNTER — Telehealth (HOSPITAL_BASED_OUTPATIENT_CLINIC_OR_DEPARTMENT_OTHER): Payer: Self-pay | Admitting: Psychiatry

## 2023-08-09 ENCOUNTER — Encounter (HOSPITAL_COMMUNITY): Payer: Self-pay

## 2023-08-09 DIAGNOSIS — Z91199 Patient's noncompliance with other medical treatment and regimen due to unspecified reason: Secondary | ICD-10-CM

## 2023-08-09 NOTE — Progress Notes (Signed)
 atient again no-show today.  I called on his cell and left a message.  His other home number is not valid.

## 2023-08-30 ENCOUNTER — Encounter (HOSPITAL_COMMUNITY): Payer: Self-pay | Admitting: Psychiatry

## 2023-08-30 ENCOUNTER — Telehealth (HOSPITAL_BASED_OUTPATIENT_CLINIC_OR_DEPARTMENT_OTHER): Admitting: Psychiatry

## 2023-08-30 VITALS — Wt 142.0 lb

## 2023-08-30 DIAGNOSIS — R4586 Emotional lability: Secondary | ICD-10-CM | POA: Diagnosis not present

## 2023-08-30 DIAGNOSIS — F419 Anxiety disorder, unspecified: Secondary | ICD-10-CM

## 2023-08-30 DIAGNOSIS — F331 Major depressive disorder, recurrent, moderate: Secondary | ICD-10-CM

## 2023-08-30 NOTE — Progress Notes (Signed)
 [facility] MD Virtual Progress Note   Patient Location: Home Provider Location: Home Office  I connect with patient by video and verified that I am speaking with correct person by using two identifiers. I discussed the limitations of evaluation and management by telemedicine and the availability of in person appointments. I also discussed with the patient that there may be a patient responsible charge related to this service. The patient expressed understanding and agreed to proceed.  Colin Galvan [id number] 78 y.o.  08/30/2023 9:26 AM  History of Present Illness:  Patient is evaluated by video session.  He reported things are going very well.  He has a new primary care Dr. Jacqualin Mate at [facility].  He is taking Lamictal  100 mg, Wellbutrin  XL 150 mg and trazodone  150 mg at bedtime.  All his medication is given by his primary care.  He reported his mood is stable and he does not get irritable, angry, agitated.  He reported his family life is also going very well.  In August he is going to be 78 year old.  He sleeps good.  He like to gain some weight but his appetite is okay and weight is unchanged from the past.  He has no tremors, shakes or any EPS.  Denies any panic attack or any feeling of hopelessness or worthlessness.  He denies any suicidal thoughts.  He has no tremors or shakes.  He like to keep his current medication from his primary care.  He like to follow-up with us  every 6 months.  He reported last visit he tried to pick up the phone but due to bad reception call did not go through.  He did call back to our office but did not get front office on time and like to consider to remove no-show visit.  Past Psychiatric History: H/O depression and anxiety. Tried Cymbalta, Klonopin and Prestiq.  No h/o mania, psychosis, inpatient treatment or suicidal attempt. We tried increasing Lamictal  but he had itching.       Outpatient Encounter Medications as of 08/30/2023   Medication Sig   allopurinol (ZYLOPRIM) 300 MG tablet Take 300 mg by mouth daily.     aspirin 81 MG tablet Take 81 mg by mouth daily.     buPROPion  (WELLBUTRIN  XL) 150 MG 24 hr tablet TAKE 1 TABLET BY MOUTH ONCE DAILY IN THE MORNING   carvedilol (COREG) 3.125 MG tablet Take 3.125 mg by mouth 2 (two) times daily.   Cetirizine HCl 10 MG CAPS Take by mouth.   gabapentin (NEURONTIN) 300 MG capsule Take 300 mg by mouth every morning. (Patient not taking: Reported on 11/11/2021)   lamoTRIgine  (LAMICTAL ) 100 MG tablet Take 100 mg by mouth daily.   lamoTRIgine  (LAMICTAL ) 25 MG tablet Take 2 tablets (50 mg total) by mouth daily. (Patient not taking: Reported on 02/07/2023)   pantoprazole (PROTONIX) 40 MG tablet Take 40 mg by mouth every morning.   rosuvastatin (CRESTOR) 40 MG tablet Take 40 mg by mouth at bedtime.   sildenafil (VIAGRA) 100 MG tablet See admin instructions.   traZODone  (DESYREL ) 50 MG tablet TAKE 3 TABLETS BY MOUTH AT BEDTIME   No facility-administered encounter medications on file as of 08/30/2023.    No results found for this or any previous visit (from the past 2160 hours).   Psychiatric Specialty Exam: Physical Exam  Review of Systems  Weight 142 lb (64.4 kg).There is no height or weight on file to calculate BMI.  General Appearance: Casual  Eye  Contact:  Good  Speech:  Clear and Coherent and Normal Rate  Volume:  Normal  Mood:  Euthymic  Affect:  Congruent  Thought Process:  Goal Directed  Orientation:  Full (Time, Place, and Person)  Thought Content:  Logical  Suicidal Thoughts:  No  Homicidal Thoughts:  No  Memory:  Immediate;   Good Recent;   Good Remote;   Good  Judgement:  Good  Insight:  Present  Psychomotor Activity:  Normal  Concentration:  Concentration: Fair and Attention Span: Fair  Recall:  Good  Fund of Knowledge:  Fair  Language:  Good  Akathisia:  No  Handed:  Right  AIMS (if indicated):     Assets:  Communication Skills Desire for  Improvement Housing Physical Health Resilience Social Support Transportation  ADL's:  Intact  Cognition:  WNL  Sleep:  good        No data to display          Assessment/Plan: Moderate episode of recurrent major depressive disorder (HCC)  Anxiety  Labile mood  Patient taking all his medication from his primary care.  I reviewed his chart and there are multiple medication which he reported not taking and that include blood pressure medicine, Prozac , muscle relaxant.  He has a new PCP.  There is a discrepancy of Lamictal  but patient confirmed he is only taking 100 mg and reported no rash or any itching.  He preferred to get all his medication from PCP.  We will get records from his primary care including current list of medication and recent blood work results.  Patient feels the current regimen is working which are Wellbutrin  XL 150 mg in the morning, trazodone  150 mg at bedtime and Lamictal  100 mg daily.  I recommend to call us  back if is any question or any concern.  Follow-up in 6 months.   Follow Up Instructions:     I discussed the assessment and treatment plan with the patient. The patient was provided an opportunity to ask questions and all were answered. The patient agreed with the plan and demonstrated an understanding of the instructions.   The patient was advised to call back or seek an in-person evaluation if the symptoms worsen or if the condition fails to improve as anticipated.    Collaboration of Care: Other provider involved in patient's care AEB we will contact his PCP to get accurate and current medication list and blood work results.  Patient/Guardian was advised Release of Information must be obtained prior to any record release in order to collaborate their care with an outside provider. Patient/Guardian was advised if they have not already done so to contact the registration department to sign all necessary forms in order for us  to release information  regarding their care.   Consent: Patient/Guardian gives verbal consent for treatment and assignment of benefits for services provided during this visit. Patient/Guardian expressed understanding and agreed to proceed.     Total encounter time 26 minutes which includes face-to-face time, chart reviewed, care coordination, order entry and documentation during this encounter.   Note: This document was prepared by [employer] voice dictation technology and any errors that results from this process are unintentional.    Arturo Late, MD 08/30/2023

## 2023-09-05 ENCOUNTER — Other Ambulatory Visit (HOSPITAL_COMMUNITY): Payer: Self-pay | Admitting: *Deleted

## 2023-09-05 DIAGNOSIS — F331 Major depressive disorder, recurrent, moderate: Secondary | ICD-10-CM

## 2023-09-05 DIAGNOSIS — Z79899 Other long term (current) drug therapy: Secondary | ICD-10-CM

## 2024-02-28 ENCOUNTER — Telehealth (HOSPITAL_COMMUNITY): Payer: Self-pay | Admitting: Psychiatry

## 2024-02-28 ENCOUNTER — Encounter (HOSPITAL_COMMUNITY): Payer: Self-pay

## 2024-02-28 NOTE — Progress Notes (Signed)
 Patient is no-show on video platform.  He had ask earlier to do a phone call for the appointment.  I tried to call few times but going to voicemail.  Left a voicemail to call back to schedule appointment.

## 2024-04-12 ENCOUNTER — Other Ambulatory Visit: Payer: Self-pay

## 2024-04-12 ENCOUNTER — Encounter (HOSPITAL_COMMUNITY): Payer: Self-pay | Admitting: Psychiatry

## 2024-04-12 ENCOUNTER — Ambulatory Visit (HOSPITAL_COMMUNITY): Admitting: Psychiatry

## 2024-04-12 VITALS — BP 132/83 | HR 83 | Ht 68.0 in | Wt 143.0 lb

## 2024-04-12 DIAGNOSIS — F331 Major depressive disorder, recurrent, moderate: Secondary | ICD-10-CM | POA: Diagnosis not present

## 2024-04-12 DIAGNOSIS — F419 Anxiety disorder, unspecified: Secondary | ICD-10-CM | POA: Diagnosis not present

## 2024-04-12 NOTE — Progress Notes (Signed)
 [facility] MD/PA/NP OP Progress Note  04/12/2024 9:37 AM Colin Galvan  MRN:  [id number]  Chief Complaint:  Chief Complaint  Patient presents with   Follow-up   HPI: Patient came today for his follow-up appointment in the office.  He reported things are going okay.  He had a very good Christmas with the family.  Usually he go to [place] to see the children but this time gets decided to go to [place] and he was very happy around the family.  He reported sometime anxious about his general health.  He admitted not as active as he used to in the past.  Sometime he has knee pain and not sure if he need knee surgery.  However he is not taking any narcotic pain medicine.  He is compliant with Wellbutrin , trazodone  and Lamictal  prescribed by his PCP Dr. Toribio Kohler at [facility].  He has no rash or itching tremor or shakes.  He sleeps good.  He does not get angry irritable or having any mood swing.  He denies any hopelessness or suicidal thoughts.  He when he had gained few pounds but so far his weight is stable but he has good appetite.  He is pleased that his wife is doing well and recently his wife's medication were adjusted and she is taking a higher dose of antidepression.  He also wondering if we have any therapist who can see his wife as she is actively looking for a therapist.  Patient does not want to change the medication.  He like to travel while he has good health.  He is in good spirits.  He like to follow-up every 6 months but getting his medication from primary care.  Visit Diagnosis:    ICD-10-CM   1. Moderate episode of recurrent major depressive disorder (HCC)  F33.1     2. Anxiety  F41.9       Past Psychiatric History: Reviewed H/O depression and anxiety. Tried Cymbalta, Klonopin and Prestiq.  No h/o mania, psychosis, inpatient treatment or suicidal attempt. We tried increasing Lamictal  but he had itching.     Past Medical History:  Past Medical History:  Diagnosis Date    Allergic rhinitis    Depression    Family hx of colon cancer    Family hx of melanoma    Fatigue    Gout    Hx of colonic polyps    Hyperlipidemia    Hypertension    Nephrolithiasis    Other diseases of lung, not elsewhere classified    Vitamin D  deficiency     Past Surgical History:  Procedure Laterality Date   KNEE ARTHROSCOPY Right    MELANOMA EXCISION N/A 07/2016   ROTATOR CUFF REPAIR Right 02/2016   SKIN CANCER EXCISION      Family Psychiatric History: Reviewed  Family History:  Family History  Problem Relation Age of Onset   Colon cancer Father        Deceased, 34   Stroke Father    Melanoma Mother        Deceased, 69   Hypertension Mother    Diabetes Mellitus II Brother    Stroke Brother        Deceased, 53   Depression Son     Social History:  Social History   Socioeconomic History   Marital status: Married    Spouse name: Not on file   Number of children: Not on file   Years of education: Not on file  Highest education level: Not on file  Occupational History   Occupation: RETIRED  Tobacco Use   Smoking status: Former    Current packs/day: 0.00    Types: Cigarettes    Quit date: 03/22/1980    Years since quitting: 44.0   Smokeless tobacco: Never  Vaping Use   Vaping status: Never Used  Substance and Sexual Activity   Alcohol use: Yes    Alcohol/week: 9.0 standard drinks of alcohol    Types: 3 Glasses of wine, 6 Cans of beer per week    Comment: 1 beer/mixed drink daily   Drug use: No   Sexual activity: Yes    Partners: Female    Birth control/protection: None  Other Topics Concern   Not on file  Social History Narrative   Lives with wife.    Retired pensions consultant, management consultant.   Highest level of education:  JD   Social Drivers of Health   Tobacco Use: Medium Risk (01/25/2024)   Received from [facility]   Patient History    Smoking Tobacco Use: Former    Smokeless Tobacco Use: Never    Passive Exposure: Not on  file  Financial Resource Strain: Not on file  Food Insecurity: Not on file  Transportation Needs: Not on file  Physical Activity: Not on file  Stress: Not on file  Social Connections: Not on file  Depression (EYV7-0): Not on file  Alcohol Screen: Not on file  Housing: Not on file  Utilities: Not on file  Health Literacy: Not on file    Allergies: Allergies[1]  Metabolic Disorder Labs: No results found for: HGBA1C, MPG No results found for: PROLACTIN No results found for: CHOL, TRIG, HDL, CHOLHDL, VLDL, LDLCALC Lab Results  Component Value Date   TSH 1.728 06/25/2014    Therapeutic Level Labs: No results found for: LITHIUM No results found for: VALPROATE No results found for: CBMZ  Current Medications: Current Outpatient Medications  Medication Sig Dispense Refill   allopurinol (ZYLOPRIM) 300 MG tablet Take 300 mg by mouth daily.       aspirin 81 MG tablet Take 81 mg by mouth daily.       buPROPion  (WELLBUTRIN  XL) 150 MG 24 hr tablet TAKE 1 TABLET BY MOUTH ONCE DAILY IN THE MORNING 90 tablet 0   carvedilol (COREG) 3.125 MG tablet Take 3.125 mg by mouth 2 (two) times daily.     Cetirizine HCl 10 MG CAPS Take by mouth.     lamoTRIgine  (LAMICTAL ) 100 MG tablet Take 100 mg by mouth daily.     pantoprazole (PROTONIX) 40 MG tablet Take 40 mg by mouth every morning.     rosuvastatin (CRESTOR) 40 MG tablet Take 40 mg by mouth at bedtime.     sildenafil (VIAGRA) 100 MG tablet See admin instructions.     traZODone  (DESYREL ) 50 MG tablet TAKE 3 TABLETS BY MOUTH AT BEDTIME 90 tablet 0   No current facility-administered medications for this visit.     Musculoskeletal: Strength & Muscle Tone: within normal limits Gait & Station: normal Patient leans: N/A  Psychiatric Specialty Exam: Review of Systems  Blood pressure 132/83, pulse 83, height 5' 8 (1.727 m), weight 143 lb (64.9 kg).There is no height or weight on file to calculate BMI.  General  Appearance: Casual  Eye Contact:  Good  Speech:  Normal Rate  Volume:  Normal  Mood:  Euthymic  Affect:  Appropriate  Thought Process:  Coherent  Orientation:  Full (Time, Place,  and Person)  Thought Content: Logical   Suicidal Thoughts:  No  Homicidal Thoughts:  No  Memory:  Immediate;   Good Recent;   Good Remote;   Good  Judgement:  Intact  Insight:  Present  Psychomotor Activity:  Normal  Concentration:  Concentration: Good and Attention Span: Good  Recall:  Fair  Fund of Knowledge: Good  Language: Good  Akathisia:  No  Handed:  Right  AIMS (if indicated): not done  Assets:  Communication Skills Desire for Improvement Housing Resilience Social Support Transportation  ADL's:  Intact  Cognition: WNL  Sleep:  Good   Screenings: Flowsheet Row Video Visit [facility] 08/01/2020 in [facility]  C-SSRS RISK CATEGORY No Risk     Assessment and Plan: Patient is 79 year old married man with history of major depressive disorder and anxiety currently taking medication from his primary care.  He is stable on his meds.  He admitted some concern about his general health which are age-related but so far manageable.  Reassurance given.  Review current medication.  He is on Wellbutrin  XL 150 mg in the morning, trazodone  150 mg at bedtime and Lamictal  100 mg daily prescribed by his PCP.  So far no major concern or side effects.  I discussed that his wife can call us  and we can provide information about therapist.  He agreed with the plan.  I encouraged to call back if any question or any concern.  Will follow-up 6 months in person.  Collaboration of Care: Collaboration of Care: Other provider involved in patient's care AEB notes are available in epic to review  Patient/Guardian was advised Release of Information must be obtained prior to any record release in order to collaborate their care with an outside provider. Patient/Guardian was advised if they  have not already done so to contact the registration department to sign all necessary forms in order for us  to release information regarding their care.   Consent: Patient/Guardian gives verbal consent for treatment and assignment of benefits for services provided during this visit. Patient/Guardian expressed understanding and agreed to proceed.    Leni ONEIDA Client, MD 04/12/2024, 9:37 AM     [1]  Allergies Allergen Reactions   Tetracycline Hcl

## 2024-10-11 ENCOUNTER — Ambulatory Visit (HOSPITAL_COMMUNITY): Admitting: Psychiatry
